# Patient Record
Sex: Male | Born: 1950 | Race: White | Hispanic: No | Marital: Married | Smoking: Current some day smoker
Health system: Southern US, Community
[De-identification: ages and names within clinical notes are randomized; demographics above are authoritative.]

## PROBLEM LIST (undated history)

## (undated) DIAGNOSIS — E119 Type 2 diabetes mellitus without complications: Secondary | ICD-10-CM

## (undated) DIAGNOSIS — Z803 Family history of malignant neoplasm of breast: Secondary | ICD-10-CM

## (undated) DIAGNOSIS — Z800 Family history of malignant neoplasm of digestive organs: Secondary | ICD-10-CM

## (undated) DIAGNOSIS — C4490 Unspecified malignant neoplasm of skin, unspecified: Secondary | ICD-10-CM

## (undated) HISTORY — DX: Family history of malignant neoplasm of digestive organs: Z80.0

## (undated) HISTORY — DX: Family history of malignant neoplasm of breast: Z80.3

## (undated) HISTORY — DX: Unspecified malignant neoplasm of skin, unspecified: C44.90

---

## 2000-04-23 ENCOUNTER — Encounter (INDEPENDENT_AMBULATORY_CARE_PROVIDER_SITE_OTHER): Payer: Self-pay | Admitting: *Deleted

## 2000-04-23 ENCOUNTER — Ambulatory Visit (HOSPITAL_COMMUNITY): Admission: RE | Admit: 2000-04-23 | Discharge: 2000-04-23 | Payer: Self-pay | Admitting: Gastroenterology

## 2001-01-18 ENCOUNTER — Other Ambulatory Visit: Admission: RE | Admit: 2001-01-18 | Discharge: 2001-01-18 | Payer: Self-pay | Admitting: Dermatology

## 2002-03-28 ENCOUNTER — Encounter: Payer: Self-pay | Admitting: Family Medicine

## 2002-03-28 ENCOUNTER — Encounter: Admission: RE | Admit: 2002-03-28 | Discharge: 2002-03-28 | Payer: Self-pay | Admitting: Family Medicine

## 2002-10-25 ENCOUNTER — Encounter: Payer: Self-pay | Admitting: Family Medicine

## 2002-10-25 ENCOUNTER — Encounter: Admission: RE | Admit: 2002-10-25 | Discharge: 2002-10-25 | Payer: Self-pay | Admitting: Family Medicine

## 2005-08-14 ENCOUNTER — Other Ambulatory Visit: Admission: RE | Admit: 2005-08-14 | Discharge: 2005-08-14 | Payer: Self-pay | Admitting: Dermatology

## 2005-12-23 ENCOUNTER — Encounter: Admission: RE | Admit: 2005-12-23 | Discharge: 2005-12-23 | Payer: Self-pay | Admitting: Family Medicine

## 2006-08-17 ENCOUNTER — Encounter: Admission: RE | Admit: 2006-08-17 | Discharge: 2006-08-17 | Payer: Self-pay | Admitting: Family Medicine

## 2010-03-31 ENCOUNTER — Emergency Department (HOSPITAL_COMMUNITY): Admission: EM | Admit: 2010-03-31 | Discharge: 2010-03-31 | Payer: Self-pay | Admitting: Emergency Medicine

## 2010-06-27 ENCOUNTER — Ambulatory Visit (HOSPITAL_COMMUNITY)
Admission: RE | Admit: 2010-06-27 | Discharge: 2010-06-27 | Payer: Self-pay | Source: Home / Self Care | Attending: Family Medicine | Admitting: Family Medicine

## 2013-03-29 ENCOUNTER — Encounter (INDEPENDENT_AMBULATORY_CARE_PROVIDER_SITE_OTHER): Payer: Self-pay | Admitting: *Deleted

## 2013-11-22 ENCOUNTER — Other Ambulatory Visit: Payer: Self-pay | Admitting: Gastroenterology

## 2015-04-12 DIAGNOSIS — J449 Chronic obstructive pulmonary disease, unspecified: Secondary | ICD-10-CM | POA: Diagnosis not present

## 2015-04-12 DIAGNOSIS — Z720 Tobacco use: Secondary | ICD-10-CM | POA: Diagnosis not present

## 2015-04-12 DIAGNOSIS — I129 Hypertensive chronic kidney disease with stage 1 through stage 4 chronic kidney disease, or unspecified chronic kidney disease: Secondary | ICD-10-CM | POA: Diagnosis not present

## 2015-04-12 DIAGNOSIS — E119 Type 2 diabetes mellitus without complications: Secondary | ICD-10-CM | POA: Diagnosis not present

## 2015-04-12 DIAGNOSIS — E782 Mixed hyperlipidemia: Secondary | ICD-10-CM | POA: Diagnosis not present

## 2015-10-15 DIAGNOSIS — E119 Type 2 diabetes mellitus without complications: Secondary | ICD-10-CM | POA: Diagnosis not present

## 2015-10-15 DIAGNOSIS — J449 Chronic obstructive pulmonary disease, unspecified: Secondary | ICD-10-CM | POA: Diagnosis not present

## 2015-10-15 DIAGNOSIS — K5900 Constipation, unspecified: Secondary | ICD-10-CM | POA: Diagnosis not present

## 2015-10-15 DIAGNOSIS — Z0000 Encounter for general adult medical examination without abnormal findings: Secondary | ICD-10-CM | POA: Diagnosis not present

## 2015-10-15 DIAGNOSIS — H6090 Unspecified otitis externa, unspecified ear: Secondary | ICD-10-CM | POA: Diagnosis not present

## 2015-10-15 DIAGNOSIS — M1990 Unspecified osteoarthritis, unspecified site: Secondary | ICD-10-CM | POA: Diagnosis not present

## 2015-10-15 DIAGNOSIS — I129 Hypertensive chronic kidney disease with stage 1 through stage 4 chronic kidney disease, or unspecified chronic kidney disease: Secondary | ICD-10-CM | POA: Diagnosis not present

## 2015-10-15 DIAGNOSIS — Z23 Encounter for immunization: Secondary | ICD-10-CM | POA: Diagnosis not present

## 2015-10-15 DIAGNOSIS — E782 Mixed hyperlipidemia: Secondary | ICD-10-CM | POA: Diagnosis not present

## 2015-10-15 DIAGNOSIS — Z125 Encounter for screening for malignant neoplasm of prostate: Secondary | ICD-10-CM | POA: Diagnosis not present

## 2015-10-15 DIAGNOSIS — Z86010 Personal history of colonic polyps: Secondary | ICD-10-CM | POA: Diagnosis not present

## 2015-10-15 DIAGNOSIS — Z720 Tobacco use: Secondary | ICD-10-CM | POA: Diagnosis not present

## 2015-11-14 DIAGNOSIS — E119 Type 2 diabetes mellitus without complications: Secondary | ICD-10-CM | POA: Diagnosis not present

## 2016-03-13 DIAGNOSIS — N5082 Scrotal pain: Secondary | ICD-10-CM | POA: Diagnosis not present

## 2016-03-13 DIAGNOSIS — L0390 Cellulitis, unspecified: Secondary | ICD-10-CM | POA: Diagnosis not present

## 2016-03-13 DIAGNOSIS — R110 Nausea: Secondary | ICD-10-CM | POA: Diagnosis not present

## 2016-03-13 DIAGNOSIS — N492 Inflammatory disorders of scrotum: Secondary | ICD-10-CM | POA: Diagnosis not present

## 2016-03-13 DIAGNOSIS — R55 Syncope and collapse: Secondary | ICD-10-CM | POA: Diagnosis not present

## 2016-03-14 ENCOUNTER — Inpatient Hospital Stay: Admit: 2016-03-14 | Payer: Medicare Other | Admitting: Urology

## 2016-03-14 ENCOUNTER — Inpatient Hospital Stay (HOSPITAL_COMMUNITY)
Admission: EM | Admit: 2016-03-14 | Discharge: 2016-03-25 | DRG: 853 | Disposition: A | Discharge status: Skilled Nursing Facility | Payer: Medicare Other | Attending: Internal Medicine | Admitting: Internal Medicine

## 2016-03-14 ENCOUNTER — Inpatient Hospital Stay (HOSPITAL_COMMUNITY): Payer: Medicare Other

## 2016-03-14 ENCOUNTER — Encounter (HOSPITAL_COMMUNITY): Payer: Self-pay | Admitting: Emergency Medicine

## 2016-03-14 ENCOUNTER — Encounter (HOSPITAL_COMMUNITY)
Admission: EM | Disposition: A | Discharge status: Skilled Nursing Facility | Payer: Self-pay | Source: Home / Self Care | Attending: Internal Medicine

## 2016-03-14 ENCOUNTER — Inpatient Hospital Stay (HOSPITAL_COMMUNITY): Payer: Medicare Other | Admitting: Anesthesiology

## 2016-03-14 DIAGNOSIS — E1152 Type 2 diabetes mellitus with diabetic peripheral angiopathy with gangrene: Secondary | ICD-10-CM | POA: Diagnosis present

## 2016-03-14 DIAGNOSIS — E872 Acidosis, unspecified: Secondary | ICD-10-CM

## 2016-03-14 DIAGNOSIS — I480 Paroxysmal atrial fibrillation: Secondary | ICD-10-CM | POA: Diagnosis not present

## 2016-03-14 DIAGNOSIS — R6520 Severe sepsis without septic shock: Secondary | ICD-10-CM | POA: Diagnosis not present

## 2016-03-14 DIAGNOSIS — I119 Hypertensive heart disease without heart failure: Secondary | ICD-10-CM | POA: Diagnosis not present

## 2016-03-14 DIAGNOSIS — E871 Hypo-osmolality and hyponatremia: Secondary | ICD-10-CM | POA: Diagnosis present

## 2016-03-14 DIAGNOSIS — N433 Hydrocele, unspecified: Secondary | ICD-10-CM | POA: Diagnosis not present

## 2016-03-14 DIAGNOSIS — Z87891 Personal history of nicotine dependence: Secondary | ICD-10-CM | POA: Diagnosis present

## 2016-03-14 DIAGNOSIS — E139 Other specified diabetes mellitus without complications: Secondary | ICD-10-CM | POA: Diagnosis not present

## 2016-03-14 DIAGNOSIS — Z79899 Other long term (current) drug therapy: Secondary | ICD-10-CM | POA: Diagnosis not present

## 2016-03-14 DIAGNOSIS — J449 Chronic obstructive pulmonary disease, unspecified: Secondary | ICD-10-CM | POA: Diagnosis not present

## 2016-03-14 DIAGNOSIS — M6281 Muscle weakness (generalized): Secondary | ICD-10-CM | POA: Diagnosis not present

## 2016-03-14 DIAGNOSIS — E785 Hyperlipidemia, unspecified: Secondary | ICD-10-CM | POA: Diagnosis not present

## 2016-03-14 DIAGNOSIS — I129 Hypertensive chronic kidney disease with stage 1 through stage 4 chronic kidney disease, or unspecified chronic kidney disease: Secondary | ICD-10-CM | POA: Diagnosis not present

## 2016-03-14 DIAGNOSIS — E119 Type 2 diabetes mellitus without complications: Secondary | ICD-10-CM | POA: Diagnosis not present

## 2016-03-14 DIAGNOSIS — F17200 Nicotine dependence, unspecified, uncomplicated: Secondary | ICD-10-CM | POA: Diagnosis not present

## 2016-03-14 DIAGNOSIS — I96 Gangrene, not elsewhere classified: Secondary | ICD-10-CM | POA: Diagnosis not present

## 2016-03-14 DIAGNOSIS — F17210 Nicotine dependence, cigarettes, uncomplicated: Secondary | ICD-10-CM | POA: Diagnosis not present

## 2016-03-14 DIAGNOSIS — E118 Type 2 diabetes mellitus with unspecified complications: Secondary | ICD-10-CM

## 2016-03-14 DIAGNOSIS — J9601 Acute respiratory failure with hypoxia: Secondary | ICD-10-CM | POA: Diagnosis not present

## 2016-03-14 DIAGNOSIS — I4891 Unspecified atrial fibrillation: Secondary | ICD-10-CM | POA: Diagnosis not present

## 2016-03-14 DIAGNOSIS — R0602 Shortness of breath: Secondary | ICD-10-CM

## 2016-03-14 DIAGNOSIS — L02215 Cutaneous abscess of perineum: Secondary | ICD-10-CM | POA: Diagnosis present

## 2016-03-14 DIAGNOSIS — S31501A Unspecified open wound of unspecified external genital organs, male, initial encounter: Secondary | ICD-10-CM | POA: Diagnosis not present

## 2016-03-14 DIAGNOSIS — I10 Essential (primary) hypertension: Secondary | ICD-10-CM | POA: Diagnosis present

## 2016-03-14 DIAGNOSIS — Z888 Allergy status to other drugs, medicaments and biological substances status: Secondary | ICD-10-CM | POA: Diagnosis not present

## 2016-03-14 DIAGNOSIS — R300 Dysuria: Secondary | ICD-10-CM | POA: Diagnosis not present

## 2016-03-14 DIAGNOSIS — E1165 Type 2 diabetes mellitus with hyperglycemia: Secondary | ICD-10-CM | POA: Diagnosis not present

## 2016-03-14 DIAGNOSIS — D72829 Elevated white blood cell count, unspecified: Secondary | ICD-10-CM | POA: Diagnosis not present

## 2016-03-14 DIAGNOSIS — E860 Dehydration: Secondary | ICD-10-CM | POA: Diagnosis present

## 2016-03-14 DIAGNOSIS — N493 Fournier gangrene: Secondary | ICD-10-CM | POA: Diagnosis present

## 2016-03-14 DIAGNOSIS — N501 Vascular disorders of male genital organs: Secondary | ICD-10-CM | POA: Diagnosis not present

## 2016-03-14 DIAGNOSIS — N432 Other hydrocele: Secondary | ICD-10-CM | POA: Diagnosis present

## 2016-03-14 DIAGNOSIS — Z794 Long term (current) use of insulin: Secondary | ICD-10-CM | POA: Diagnosis not present

## 2016-03-14 DIAGNOSIS — R739 Hyperglycemia, unspecified: Secondary | ICD-10-CM

## 2016-03-14 DIAGNOSIS — N509 Disorder of male genital organs, unspecified: Secondary | ICD-10-CM | POA: Diagnosis not present

## 2016-03-14 DIAGNOSIS — Z885 Allergy status to narcotic agent status: Secondary | ICD-10-CM | POA: Diagnosis not present

## 2016-03-14 DIAGNOSIS — I9789 Other postprocedural complications and disorders of the circulatory system, not elsewhere classified: Secondary | ICD-10-CM | POA: Diagnosis not present

## 2016-03-14 DIAGNOSIS — Z48817 Encounter for surgical aftercare following surgery on the skin and subcutaneous tissue: Secondary | ICD-10-CM | POA: Diagnosis not present

## 2016-03-14 DIAGNOSIS — Z7982 Long term (current) use of aspirin: Secondary | ICD-10-CM | POA: Diagnosis not present

## 2016-03-14 DIAGNOSIS — IMO0002 Reserved for concepts with insufficient information to code with codable children: Secondary | ICD-10-CM | POA: Diagnosis present

## 2016-03-14 DIAGNOSIS — N492 Inflammatory disorders of scrotum: Secondary | ICD-10-CM | POA: Diagnosis not present

## 2016-03-14 DIAGNOSIS — A419 Sepsis, unspecified organism: Secondary | ICD-10-CM | POA: Diagnosis not present

## 2016-03-14 DIAGNOSIS — E0865 Diabetes mellitus due to underlying condition with hyperglycemia: Secondary | ICD-10-CM | POA: Diagnosis not present

## 2016-03-14 DIAGNOSIS — N179 Acute kidney failure, unspecified: Secondary | ICD-10-CM

## 2016-03-14 DIAGNOSIS — D62 Acute posthemorrhagic anemia: Secondary | ICD-10-CM | POA: Diagnosis not present

## 2016-03-14 DIAGNOSIS — K760 Fatty (change of) liver, not elsewhere classified: Secondary | ICD-10-CM | POA: Diagnosis not present

## 2016-03-14 DIAGNOSIS — E089 Diabetes mellitus due to underlying condition without complications: Secondary | ICD-10-CM

## 2016-03-14 DIAGNOSIS — I517 Cardiomegaly: Secondary | ICD-10-CM | POA: Diagnosis not present

## 2016-03-14 DIAGNOSIS — L0889 Other specified local infections of the skin and subcutaneous tissue: Secondary | ICD-10-CM | POA: Diagnosis not present

## 2016-03-14 DIAGNOSIS — R6889 Other general symptoms and signs: Secondary | ICD-10-CM | POA: Diagnosis present

## 2016-03-14 DIAGNOSIS — N289 Disorder of kidney and ureter, unspecified: Secondary | ICD-10-CM

## 2016-03-14 HISTORY — PX: IRRIGATION AND DEBRIDEMENT ABSCESS: SHX5252

## 2016-03-14 HISTORY — PX: INCISION AND DRAINAGE ABSCESS: SHX5864

## 2016-03-14 HISTORY — DX: Type 2 diabetes mellitus without complications: E11.9

## 2016-03-14 LAB — URINE MICROSCOPIC-ADD ON

## 2016-03-14 LAB — URINALYSIS, ROUTINE W REFLEX MICROSCOPIC
Bilirubin Urine: NEGATIVE
GLUCOSE, UA: NEGATIVE mg/dL
KETONES UR: NEGATIVE mg/dL
LEUKOCYTES UA: NEGATIVE
Nitrite: NEGATIVE
PROTEIN: 30 mg/dL — AB
Specific Gravity, Urine: 1.028 (ref 1.005–1.030)
pH: 6 (ref 5.0–8.0)

## 2016-03-14 LAB — CBC WITH DIFFERENTIAL/PLATELET
BASOS ABS: 0 10*3/uL (ref 0.0–0.1)
Basophils Relative: 0 %
EOS ABS: 0 10*3/uL (ref 0.0–0.7)
EOS PCT: 0 %
HCT: 38.1 % — ABNORMAL LOW (ref 39.0–52.0)
Hemoglobin: 13.9 g/dL (ref 13.0–17.0)
LYMPHS ABS: 0.7 10*3/uL (ref 0.7–4.0)
Lymphocytes Relative: 4 %
MCH: 31.4 pg (ref 26.0–34.0)
MCHC: 36.5 g/dL — ABNORMAL HIGH (ref 30.0–36.0)
MCV: 86 fL (ref 78.0–100.0)
MONO ABS: 1.4 10*3/uL — AB (ref 0.1–1.0)
Monocytes Relative: 8 %
NEUTROS PCT: 88 %
Neutro Abs: 14.9 10*3/uL — ABNORMAL HIGH (ref 1.7–7.7)
PLATELETS: 169 10*3/uL (ref 150–400)
RBC: 4.43 MIL/uL (ref 4.22–5.81)
RDW: 13.2 % (ref 11.5–15.5)
WBC Morphology: INCREASED
WBC: 17 10*3/uL — AB (ref 4.0–10.5)

## 2016-03-14 LAB — COMPREHENSIVE METABOLIC PANEL
ALK PHOS: 58 U/L (ref 38–126)
ALT: 16 U/L — AB (ref 17–63)
AST: 20 U/L (ref 15–41)
Albumin: 2.8 g/dL — ABNORMAL LOW (ref 3.5–5.0)
Anion gap: 12 (ref 5–15)
BILIRUBIN TOTAL: 0.9 mg/dL (ref 0.3–1.2)
BUN: 27 mg/dL — AB (ref 6–20)
CALCIUM: 8.5 mg/dL — AB (ref 8.9–10.3)
CO2: 24 mmol/L (ref 22–32)
CREATININE: 1.34 mg/dL — AB (ref 0.61–1.24)
Chloride: 88 mmol/L — ABNORMAL LOW (ref 101–111)
GFR, EST NON AFRICAN AMERICAN: 54 mL/min — AB (ref >60)
Glucose, Bld: 208 mg/dL — ABNORMAL HIGH (ref 65–99)
Potassium: 3.7 mmol/L (ref 3.5–5.1)
Sodium: 124 mmol/L — ABNORMAL LOW (ref 135–145)
TOTAL PROTEIN: 6.6 g/dL (ref 6.5–8.1)

## 2016-03-14 LAB — GLUCOSE, CAPILLARY
Glucose-Capillary: 209 mg/dL — ABNORMAL HIGH (ref 65–99)
Glucose-Capillary: 175 mg/dL — ABNORMAL HIGH (ref 65–99)
Glucose-Capillary: 191 mg/dL — ABNORMAL HIGH (ref 65–99)

## 2016-03-14 LAB — I-STAT CG4 LACTIC ACID, ED: Lactic Acid, Venous: 3.01 mmol/L (ref 0.5–1.9)

## 2016-03-14 LAB — MRSA PCR SCREENING: MRSA BY PCR: NEGATIVE

## 2016-03-14 SURGERY — IRRIGATION AND DEBRIDEMENT ABSCESS
Anesthesia: General | Site: Scrotum

## 2016-03-14 MED ORDER — PIPERACILLIN-TAZOBACTAM 3.375 G IVPB
3.3750 g | Freq: Three times a day (TID) | INTRAVENOUS | Status: DC
  Administered 2016-03-14 – 2016-03-20 (×17): 3.375 g via INTRAVENOUS
  Filled 2016-03-14 (×18): qty 50

## 2016-03-14 MED ORDER — FENTANYL CITRATE (PF) 100 MCG/2ML IJ SOLN
INTRAMUSCULAR | Status: AC
  Filled 2016-03-14: qty 2

## 2016-03-14 MED ORDER — FENOFIBRATE 160 MG PO TABS
160.0000 mg | ORAL_TABLET | Freq: Every day | ORAL | Status: DC
  Administered 2016-03-14 – 2016-03-25 (×10): 160 mg via ORAL
  Filled 2016-03-14 (×10): qty 1

## 2016-03-14 MED ORDER — LIDOCAINE 2% (20 MG/ML) 5 ML SYRINGE
INTRAMUSCULAR | Status: DC | PRN
  Administered 2016-03-14: 50 mg via INTRAVENOUS

## 2016-03-14 MED ORDER — VANCOMYCIN HCL IN DEXTROSE 1-5 GM/200ML-% IV SOLN
1000.0000 mg | Freq: Once | INTRAVENOUS | Status: AC
  Administered 2016-03-14: 1000 mg via INTRAVENOUS
  Filled 2016-03-14: qty 200

## 2016-03-14 MED ORDER — PHENYLEPHRINE HCL 10 MG/ML IJ SOLN
INTRAMUSCULAR | Status: AC
  Filled 2016-03-14: qty 1

## 2016-03-14 MED ORDER — SODIUM CHLORIDE 0.9 % IV BOLUS (SEPSIS)
1000.0000 mL | Freq: Once | INTRAVENOUS | Status: AC
  Administered 2016-03-14: 1000 mL via INTRAVENOUS

## 2016-03-14 MED ORDER — PHENYLEPHRINE 40 MCG/ML (10ML) SYRINGE FOR IV PUSH (FOR BLOOD PRESSURE SUPPORT)
PREFILLED_SYRINGE | INTRAVENOUS | Status: AC
  Filled 2016-03-14: qty 30

## 2016-03-14 MED ORDER — ACETAMINOPHEN 650 MG RE SUPP: 650.0000 mg | Freq: Four times a day (QID) | RECTAL | Status: DC | PRN

## 2016-03-14 MED ORDER — HEPARIN SODIUM (PORCINE) 5000 UNIT/ML IJ SOLN: 5000.0000 [IU] | Freq: Three times a day (TID) | INTRAMUSCULAR | Status: DC

## 2016-03-14 MED ORDER — POTASSIUM CHLORIDE IN NACL 20-0.9 MEQ/L-% IV SOLN
INTRAVENOUS | Status: AC
  Administered 2016-03-14 – 2016-03-15 (×3): via INTRAVENOUS
  Filled 2016-03-14 (×4): qty 1000

## 2016-03-14 MED ORDER — ONDANSETRON HCL 4 MG/2ML IJ SOLN
INTRAMUSCULAR | Status: AC
  Filled 2016-03-14: qty 2

## 2016-03-14 MED ORDER — IOPAMIDOL (ISOVUE-300) INJECTION 61%
30.0000 mL | Freq: Once | INTRAVENOUS | Status: AC | PRN
  Administered 2016-03-14: 30 mL via ORAL

## 2016-03-14 MED ORDER — FENTANYL CITRATE (PF) 100 MCG/2ML IJ SOLN
INTRAMUSCULAR | Status: DC | PRN
  Administered 2016-03-14: 100 ug via INTRAVENOUS

## 2016-03-14 MED ORDER — ACETAMINOPHEN 325 MG PO TABS
650.0000 mg | ORAL_TABLET | Freq: Four times a day (QID) | ORAL | Status: DC | PRN
  Administered 2016-03-14 – 2016-03-19 (×4): 650 mg via ORAL
  Filled 2016-03-14 (×4): qty 2

## 2016-03-14 MED ORDER — SENNOSIDES-DOCUSATE SODIUM 8.6-50 MG PO TABS
1.0000 | ORAL_TABLET | Freq: Every evening | ORAL | Status: DC | PRN
  Administered 2016-03-22: 1 via ORAL
  Filled 2016-03-14: qty 1

## 2016-03-14 MED ORDER — PHENYLEPHRINE HCL 10 MG/ML IJ SOLN
INTRAVENOUS | Status: DC | PRN
  Administered 2016-03-14: 150 ug/min via INTRAVENOUS

## 2016-03-14 MED ORDER — ACETAMINOPHEN 10 MG/ML IV SOLN
INTRAVENOUS | Status: AC
  Filled 2016-03-14: qty 100

## 2016-03-14 MED ORDER — PROPOFOL 10 MG/ML IV BOLUS
INTRAVENOUS | Status: AC
Start: 2016-03-14 — End: 2016-03-14
  Filled 2016-03-14: qty 20

## 2016-03-14 MED ORDER — COLESEVELAM HCL 625 MG PO TABS
1250.0000 mg | ORAL_TABLET | Freq: Two times a day (BID) | ORAL | Status: DC
  Administered 2016-03-14 – 2016-03-25 (×19): 1250 mg via ORAL
  Filled 2016-03-14 (×25): qty 2

## 2016-03-14 MED ORDER — PROPOFOL 10 MG/ML IV BOLUS
INTRAVENOUS | Status: AC
  Filled 2016-03-14: qty 20

## 2016-03-14 MED ORDER — ONDANSETRON HCL 4 MG/2ML IJ SOLN: 4.0000 mg | Freq: Four times a day (QID) | INTRAMUSCULAR | Status: DC | PRN

## 2016-03-14 MED ORDER — INSULIN GLARGINE 100 UNIT/ML ~~LOC~~ SOLN
8.0000 [IU] | Freq: Every day | SUBCUTANEOUS | Status: DC
  Administered 2016-03-15 – 2016-03-24 (×9): 8 [IU] via SUBCUTANEOUS
  Filled 2016-03-14 (×10): qty 0.08

## 2016-03-14 MED ORDER — ONDANSETRON HCL 4 MG/2ML IJ SOLN: 4.0000 mg | Freq: Once | INTRAMUSCULAR | Status: DC | PRN

## 2016-03-14 MED ORDER — PROPOFOL 10 MG/ML IV BOLUS
INTRAVENOUS | Status: DC | PRN
  Administered 2016-03-14: 50 mg via INTRAVENOUS
  Administered 2016-03-14: 160 mg via INTRAVENOUS

## 2016-03-14 MED ORDER — MIDAZOLAM HCL 5 MG/5ML IJ SOLN
INTRAMUSCULAR | Status: DC | PRN
  Administered 2016-03-14: 2 mg via INTRAVENOUS

## 2016-03-14 MED ORDER — PHENYLEPHRINE 40 MCG/ML (10ML) SYRINGE FOR IV PUSH (FOR BLOOD PRESSURE SUPPORT)
PREFILLED_SYRINGE | INTRAVENOUS | Status: DC | PRN
  Administered 2016-03-14 (×2): 120 ug via INTRAVENOUS
  Administered 2016-03-14: 80 ug via INTRAVENOUS
  Administered 2016-03-14 (×3): 120 ug via INTRAVENOUS
  Administered 2016-03-14: 200 ug via INTRAVENOUS
  Administered 2016-03-14 (×2): 120 ug via INTRAVENOUS

## 2016-03-14 MED ORDER — SODIUM CHLORIDE 0.9 % IV SOLN
INTRAVENOUS | Status: DC
  Administered 2016-03-14 (×2): via INTRAVENOUS

## 2016-03-14 MED ORDER — MEPERIDINE HCL 50 MG/ML IJ SOLN: 6.2500 mg | INTRAMUSCULAR | Status: DC | PRN

## 2016-03-14 MED ORDER — MIDAZOLAM HCL 2 MG/2ML IJ SOLN
INTRAMUSCULAR | Status: AC
  Filled 2016-03-14: qty 2

## 2016-03-14 MED ORDER — SODIUM CHLORIDE 0.9 % IR SOLN
Status: DC | PRN
  Administered 2016-03-14: 500 mL

## 2016-03-14 MED ORDER — LIDOCAINE 2% (20 MG/ML) 5 ML SYRINGE
INTRAMUSCULAR | Status: AC
Start: 2016-03-14 — End: 2016-03-14
  Filled 2016-03-14: qty 5

## 2016-03-14 MED ORDER — OXYCODONE HCL 5 MG/5ML PO SOLN: 5.0000 mg | Freq: Once | ORAL | Status: DC | PRN

## 2016-03-14 MED ORDER — INSULIN ASPART 100 UNIT/ML ~~LOC~~ SOLN: 3.0000 [IU] | Freq: Three times a day (TID) | SUBCUTANEOUS | Status: DC

## 2016-03-14 MED ORDER — STERILE WATER FOR IRRIGATION IR SOLN
Status: DC | PRN
  Administered 2016-03-14: 1000 mL

## 2016-03-14 MED ORDER — PIPERACILLIN-TAZOBACTAM 3.375 G IVPB 30 MIN
3.3750 g | Freq: Once | INTRAVENOUS | Status: AC
  Administered 2016-03-14: 3.375 g via INTRAVENOUS
  Filled 2016-03-14: qty 50

## 2016-03-14 MED ORDER — OXYCODONE HCL 5 MG PO TABS: 5.0000 mg | ORAL_TABLET | Freq: Once | ORAL | Status: DC | PRN

## 2016-03-14 MED ORDER — IPRATROPIUM-ALBUTEROL 0.5-2.5 (3) MG/3ML IN SOLN
3.0000 mL | Freq: Once | RESPIRATORY_TRACT | Status: AC
  Administered 2016-03-14: 3 mL via RESPIRATORY_TRACT
  Filled 2016-03-14: qty 3

## 2016-03-14 MED ORDER — ONDANSETRON HCL 4 MG PO TABS: 4.0000 mg | ORAL_TABLET | Freq: Four times a day (QID) | ORAL | Status: DC | PRN

## 2016-03-14 MED ORDER — ASPIRIN EC 325 MG PO TBEC
325.0000 mg | DELAYED_RELEASE_TABLET | Freq: Every evening | ORAL | Status: DC
  Administered 2016-03-14 – 2016-03-24 (×11): 325 mg via ORAL
  Filled 2016-03-14 (×11): qty 1

## 2016-03-14 MED ORDER — VANCOMYCIN HCL IN DEXTROSE 750-5 MG/150ML-% IV SOLN
750.0000 mg | Freq: Two times a day (BID) | INTRAVENOUS | Status: DC
  Administered 2016-03-14 – 2016-03-16 (×4): 750 mg via INTRAVENOUS
  Filled 2016-03-14 (×4): qty 150

## 2016-03-14 MED ORDER — ACETAMINOPHEN 10 MG/ML IV SOLN
1000.0000 mg | Freq: Once | INTRAVENOUS | Status: AC
  Administered 2016-03-14: 1000 mg via INTRAVENOUS

## 2016-03-14 MED ORDER — ZOLPIDEM TARTRATE 5 MG PO TABS
5.0000 mg | ORAL_TABLET | Freq: Every evening | ORAL | Status: DC | PRN
  Administered 2016-03-14: 5 mg via ORAL
  Filled 2016-03-14: qty 1

## 2016-03-14 MED ORDER — IOPAMIDOL (ISOVUE-300) INJECTION 61%
100.0000 mL | Freq: Once | INTRAVENOUS | Status: AC | PRN
  Administered 2016-03-14: 100 mL via INTRAVENOUS

## 2016-03-14 MED ORDER — INSULIN ASPART 100 UNIT/ML ~~LOC~~ SOLN
0.0000 [IU] | Freq: Three times a day (TID) | SUBCUTANEOUS | Status: DC
  Administered 2016-03-14 – 2016-03-15 (×3): 2 [IU] via SUBCUTANEOUS
  Administered 2016-03-15: 3 [IU] via SUBCUTANEOUS
  Administered 2016-03-16 (×3): 1 [IU] via SUBCUTANEOUS
  Administered 2016-03-17: 2 [IU] via SUBCUTANEOUS
  Administered 2016-03-17: 1 [IU] via SUBCUTANEOUS
  Administered 2016-03-17: 3 [IU] via SUBCUTANEOUS
  Administered 2016-03-18 – 2016-03-19 (×5): 1 [IU] via SUBCUTANEOUS
  Administered 2016-03-20: 3 [IU] via SUBCUTANEOUS
  Administered 2016-03-20 – 2016-03-22 (×5): 1 [IU] via SUBCUTANEOUS
  Administered 2016-03-22: 2 [IU] via SUBCUTANEOUS
  Administered 2016-03-22: 3 [IU] via SUBCUTANEOUS
  Administered 2016-03-23: 2 [IU] via SUBCUTANEOUS
  Administered 2016-03-23: 1 [IU] via SUBCUTANEOUS
  Administered 2016-03-24: 2 [IU] via SUBCUTANEOUS
  Administered 2016-03-24 – 2016-03-25 (×2): 1 [IU] via SUBCUTANEOUS

## 2016-03-14 MED ORDER — HYDROMORPHONE HCL 1 MG/ML IJ SOLN: 0.2500 mg | INTRAMUSCULAR | Status: DC | PRN

## 2016-03-14 MED ORDER — IPRATROPIUM-ALBUTEROL 0.5-2.5 (3) MG/3ML IN SOLN
3.0000 mL | Freq: Three times a day (TID) | RESPIRATORY_TRACT | Status: DC
  Administered 2016-03-15: 3 mL via RESPIRATORY_TRACT
  Filled 2016-03-14: qty 3

## 2016-03-14 MED ORDER — SUCCINYLCHOLINE CHLORIDE 20 MG/ML IJ SOLN
INTRAMUSCULAR | Status: DC | PRN
  Administered 2016-03-14: 80 mg via INTRAVENOUS

## 2016-03-14 MED ORDER — HYDROMORPHONE HCL 1 MG/ML IJ SOLN
1.0000 mg | Freq: Once | INTRAMUSCULAR | Status: AC
  Administered 2016-03-14: 1 mg via INTRAVENOUS
  Filled 2016-03-14: qty 1

## 2016-03-14 MED ORDER — INSULIN ASPART 100 UNIT/ML ~~LOC~~ SOLN
3.0000 [IU] | Freq: Three times a day (TID) | SUBCUTANEOUS | Status: DC
  Administered 2016-03-14 – 2016-03-25 (×21): 3 [IU] via SUBCUTANEOUS

## 2016-03-14 MED ORDER — SODIUM CHLORIDE 0.9 % IR SOLN
Status: AC
  Filled 2016-03-14: qty 500000

## 2016-03-14 MED ORDER — ROSUVASTATIN CALCIUM 10 MG PO TABS
5.0000 mg | ORAL_TABLET | Freq: Every evening | ORAL | Status: DC
  Administered 2016-03-14 – 2016-03-24 (×11): 5 mg via ORAL
  Filled 2016-03-14 (×12): qty 1

## 2016-03-14 MED ORDER — CLINDAMYCIN PHOSPHATE 900 MG/50ML IV SOLN
900.0000 mg | Freq: Once | INTRAVENOUS | Status: AC
  Administered 2016-03-14: 900 mg via INTRAVENOUS
  Filled 2016-03-14: qty 50

## 2016-03-14 MED ORDER — SODIUM CHLORIDE 0.9 % IR SOLN
Status: DC | PRN
  Administered 2016-03-14: 6000 mL

## 2016-03-14 MED ORDER — LACTATED RINGERS IV SOLN: INTRAVENOUS | Status: DC | PRN

## 2016-03-14 MED ORDER — IPRATROPIUM-ALBUTEROL 0.5-2.5 (3) MG/3ML IN SOLN
3.0000 mL | Freq: Four times a day (QID) | RESPIRATORY_TRACT | Status: DC
  Administered 2016-03-14: 3 mL via RESPIRATORY_TRACT
  Filled 2016-03-14: qty 3

## 2016-03-14 SURGICAL SUPPLY — 50 items
APL SKNCLS STERI-STRIP NONHPOA (GAUZE/BANDAGES/DRESSINGS)
BENZOIN TINCTURE PRP APPL 2/3 (GAUZE/BANDAGES/DRESSINGS) IMPLANT
BLADE HEX COATED 2.75 (ELECTRODE) ×8 IMPLANT
BLADE SURG 15 STRL LF DISP TIS (BLADE) ×4 IMPLANT
BLADE SURG 15 STRL SS (BLADE) ×8
BLADE SURG SZ10 CARB STEEL (BLADE) ×8 IMPLANT
BNDG GAUZE ELAST 4 BULKY (GAUZE/BANDAGES/DRESSINGS) ×4 IMPLANT
BRIEF STRETCH FOR OB PAD LRG (UNDERPADS AND DIAPERS) ×2 IMPLANT
CATH FOLEY 2WAY SLVR  5CC 18FR (CATHETERS) ×2
CATH FOLEY 2WAY SLVR 5CC 18FR (CATHETERS) IMPLANT
CLOSURE WOUND 1/2 X4 (GAUZE/BANDAGES/DRESSINGS)
DRAPE LAPAROTOMY T 102X78X121 (DRAPES) ×2 IMPLANT
DRAPE POUCH INSTRU U-SHP 10X18 (DRAPES) ×4 IMPLANT
DRSG KUZMA FLUFF (GAUZE/BANDAGES/DRESSINGS) ×2 IMPLANT
ELECT PENCIL ROCKER SW 15FT (MISCELLANEOUS) ×8 IMPLANT
ELECT REM PT RETURN 9FT ADLT (ELECTROSURGICAL) ×8
ELECTRODE REM PT RTRN 9FT ADLT (ELECTROSURGICAL) ×4 IMPLANT
EVACUATOR SILICONE 100CC (DRAIN) IMPLANT
GAUZE SPONGE 4X4 12PLY STRL (GAUZE/BANDAGES/DRESSINGS) ×8 IMPLANT
GAUZE SPONGE 4X4 16PLY XRAY LF (GAUZE/BANDAGES/DRESSINGS) ×6 IMPLANT
GLOVE BIO SURGEON STRL SZ7.5 (GLOVE) ×2 IMPLANT
GLOVE BIOGEL PI IND STRL 7.0 (GLOVE) ×2 IMPLANT
GLOVE BIOGEL PI IND STRL 7.5 (GLOVE) IMPLANT
GLOVE BIOGEL PI IND STRL 8 (GLOVE) IMPLANT
GLOVE BIOGEL PI INDICATOR 7.0 (GLOVE) ×4
GLOVE BIOGEL PI INDICATOR 7.5 (GLOVE) ×2
GLOVE BIOGEL PI INDICATOR 8 (GLOVE) ×2
GLOVE EUDERMIC 7 POWDERFREE (GLOVE) ×8 IMPLANT
GLOVE SURG ORTHO 8.0 STRL STRW (GLOVE) ×2 IMPLANT
GOWN STRL REUS W/TWL LRG LVL3 (GOWN DISPOSABLE) ×4 IMPLANT
GOWN STRL REUS W/TWL XL LVL3 (GOWN DISPOSABLE) ×10 IMPLANT
HANDPIECE INTERPULSE COAX TIP (DISPOSABLE) ×4
KIT BASIN OR (CUSTOM PROCEDURE TRAY) ×6 IMPLANT
NDL HYPO 25X1 1.5 SAFETY (NEEDLE) ×4 IMPLANT
NEEDLE HYPO 25X1 1.5 SAFETY (NEEDLE) ×8 IMPLANT
PACK BASIC VI WITH GOWN DISP (CUSTOM PROCEDURE TRAY) ×6 IMPLANT
PAD ABD 8X10 STRL (GAUZE/BANDAGES/DRESSINGS) ×6 IMPLANT
SET HNDPC FAN SPRY TIP SCT (DISPOSABLE) IMPLANT
SHEET LAVH (DRAPES) ×2 IMPLANT
SOL PREP POV-IOD 4OZ 10% (MISCELLANEOUS) ×4 IMPLANT
SPONGE LAP 18X18 X RAY DECT (DISPOSABLE) ×4 IMPLANT
SPONGE LAP 4X18 X RAY DECT (DISPOSABLE) ×8 IMPLANT
STAPLER VISISTAT 35W (STAPLE) IMPLANT
STRIP CLOSURE SKIN 1/2X4 (GAUZE/BANDAGES/DRESSINGS) IMPLANT
SUT MNCRL AB 4-0 PS2 18 (SUTURE) IMPLANT
SUT VIC AB 3-0 SH 18 (SUTURE) IMPLANT
SYR CONTROL 10ML LL (SYRINGE) ×6 IMPLANT
TOWEL OR 17X26 10 PK STRL BLUE (TOWEL DISPOSABLE) ×4 IMPLANT
TUBE CONNECTING VINYL 14FR 30C (MISCELLANEOUS) ×2 IMPLANT
YANKAUER SUCT BULB TIP 10FT TU (MISCELLANEOUS) ×2 IMPLANT

## 2016-03-14 NOTE — ED Notes (Signed)
Lactic 3.01 MD &RN made aware

## 2016-03-14 NOTE — ED Notes (Signed)
Unable to collect lab patient is not in the room

## 2016-03-14 NOTE — H&P (Addendum)
History and Physical  Jonathan Tucker [id number] DOB: January 21, 1951 DOA: 03/14/2016  Referring physician: Leonette Monarch PCP: Glo Herring., MD  Outpatient Specialists: [place] - urology  Chief Complaint:: groin swelling  HPI: Jonathan Tucker is a 65 y.o. male with history of type 2 diabetes mellitus, non-insulin requiring was sent to ED by his urologist earlier today with concerns for sepsis and a perineal infection concerning for fournier's gangrene.  He developed symptoms 5 days ago and they have progressively worsened.  He was having rigors at home followed by body sweats and fever.  He is having increasing pain and discomfort in groin.  He was seen in the office of his urologist today and sent to ED for admission.   He is going to the OR later today.    In ED patient was noted to have an elevated lactic acid, leukocytosis and tachycardia.  He was started on sepsis bundle and medical admission was requested.    Review of Systems: All systems reviewed and apart from history of presenting illness, are negative.  Past Medical History:  Diagnosis Date  . Diabetes mellitus without complication ([place])    History reviewed. No pertinent surgical history. Social History:  reports that he has been smoking.  He has never used smokeless tobacco. He reports that he does not drink alcohol. His drug history is not on file.   Allergies  Allergen Reactions  . Codeine     Does not tolerate, tolerates hydrocodone   . Flexeril [Cyclobenzaprine]     Burning/itching    History reviewed. No pertinent family history.   Prior to Admission medications   Medication Sig Start Date End Date Taking? Authorizing Provider  aspirin EC 325 MG tablet Take 325 mg by mouth every evening.   Yes Historical Provider, MD  calcium carbonate (TUMS - DOSED IN MG ELEMENTAL CALCIUM) 500 MG chewable tablet Chew 1 tablet by mouth daily as needed for indigestion or heartburn.   Yes Historical Provider, MD  colesevelam  (WELCHOL) 625 MG tablet Take 1,250 mg by mouth 2 (two) times daily with a meal.   Yes Historical Provider, MD  fenofibrate (TRICOR) 145 MG tablet Take 145 mg by mouth every evening.   Yes Historical Provider, MD  hydrochlorothiazide (HYDRODIURIL) 25 MG tablet Take 25 mg by mouth daily.   Yes Historical Provider, MD  naproxen sodium (ANAPROX) 220 MG tablet Take 440 mg by mouth daily as needed (pain).   Yes Historical Provider, MD  ramipril (ALTACE) 10 MG capsule Take 10 mg by mouth every evening.   Yes Historical Provider, MD  rosuvastatin (CRESTOR) 5 MG tablet Take 5 mg by mouth every evening.   Yes Historical Provider, MD   Physical Exam: Vitals:   03/14/16 1055 03/14/16 1100 03/14/16 1126 03/14/16 1156  BP: 124/81 126/82 126/82 126/82  Pulse: 116 115 (!) 57 60  Resp: (!) 30 20 15 15   Temp:      TempSrc:      SpO2: 97% 96% 93% 98%  Weight:       Constitutional: He is oriented to person, place, and time. He appears well-developed and well-nourished. No distress.  HENT: Head: Normocephalic and atraumatic.  Eyes: Conjunctivae are normal. Pupils are equal, round, and reactive to light. No scleral icterus.  Neck: Normal range of motion. Neck supple. No tracheal deviation present. No thyromegaly present.  Cardiovascular: Regular rhythm and normal heart sounds.  No murmur heard. Respiratory: Effort normal. No respiratory distress. He has diffuse bilateral wheezes (  expiratory). He has no rales.  GI: Soft. Bowel sounds are normal. He exhibits no distension and no mass. There is no tenderness. There is no rebound and no guarding.  Genitourinary: Penis normal.  Genitourinary Comments: Enlarged, indurated and tender scrotum; induration and erythema extending onto perineum and along medial right buttock; no drainage   Musculoskeletal: Normal range of motion. He exhibits no edema or deformity.  Neurological: He is alert and oriented to person, place, and time.  Skin: Skin is warm and dry. He is not  diaphoretic.  Psychiatric: He has a normal mood and affect. His behavior is normal.   Labs on Admission:  Basic Metabolic Panel:  Recent Labs Lab 03/14/16 1034  NA 124*  K 3.7  CL 88*  CO2 24  GLUCOSE 208*  BUN 27*  CREATININE 1.34*  CALCIUM 8.5*   Liver Function Tests:  Recent Labs Lab 03/14/16 1034  AST 20  ALT 16*  ALKPHOS 58  BILITOT 0.9  PROT 6.6  ALBUMIN 2.8*   No results for input(s): LIPASE, AMYLASE in the last 168 hours. No results for input(s): AMMONIA in the last 168 hours. CBC:  Recent Labs Lab 03/14/16 1034  WBC 17.0*  NEUTROABS 14.9*  HGB 13.9  HCT 38.1*  MCV 86.0  PLT 169   Cardiac Enzymes: No results for input(s): CKTOTAL, CKMB, CKMBINDEX, TROPONINI in the last 168 hours.  BNP (last 3 results) No results for input(s): PROBNP in the last 8760 hours. CBG: No results for input(s): GLUCAP in the last 168 hours.  Radiological Exams on Admission: No results found.  EKG: Independently reviewed.   Assessment/Plan Principal Problem:   Sepsis ([place]) Active Problems:   Uncontrolled diabetes mellitus ([place])   Fournier's gangrene in male   Hyponatremia   Leukocytosis   Acute renal failure (ARF) ([facility])   Current every day smoker   Severe sepsis ([place])  1. Severe sepsis secondary to peritoneal infection, concerning for fournier's gangrene - Admit to SDU, continue sepsis bundle, trend lactate, continue IV antibiotics, Pt to be taken to OR later today by urology.  2. Hyponatremia secondary to dehydration - should correct with IVF hydration.  3. COPD-duonebs ordered. Pt reports that this has been stable and only uses albuterol rescue inhalers as needed.  4. Acute Renal Failure - secondary to prerenal azotemia, repeat labs in AM. Avoiding nephrotoxic agents.  Renally dose antibiotics.  5. Current everyday smoker - cessation encouraged, nebs ordered as needed for wheezing.  6. Type 2 diabetes mellitus, non insulin requiring - managing with frequent  accu-cheks, supplemental sliding scale coverage, low dose basal insulin, low dose prandial coverage when eating (hold when NPO).  Further titrate as needed.  Checking A1c.    DVT Prophylaxis: heparin Code Status: Full  Family Communication: bedside  Disposition Plan: TBD   Critical Care Time spent: 65 mins  Irwin Brakeman, MD [facility] Pager [phone]  If 7PM-7AM, please contact night-coverage [email or web address] Password [facility] 03/14/2016, 12:07 PM

## 2016-03-14 NOTE — Anesthesia Preprocedure Evaluation (Signed)
Anesthesia Evaluation  Patient identified by MRN, date of birth, ID band Patient awake    Reviewed: Allergy & Precautions, NPO status , Patient's Chart, lab work & pertinent test results  Airway Mallampati: I  TM Distance: >3 FB Neck ROM: Full    Dental  (+) Poor Dentition, Dental Advisory Given, Teeth Intact   Pulmonary COPD, Current Smoker,    breath sounds clear to auscultation       Cardiovascular  Rhythm:Regular Rate:Normal     Neuro/Psych    GI/Hepatic   Endo/Other  diabetes, Poorly Controlled, Type 2  Renal/GU      Musculoskeletal   Abdominal   Peds  Hematology   Anesthesia Other Findings ?Sepsis based on Lactic acid elevation  Reproductive/Obstetrics                             Anesthesia Physical Anesthesia Plan  ASA: III  Anesthesia Plan: General   Post-op Pain Management:    Induction: Intravenous  Airway Management Planned: Oral ETT  Additional Equipment:   Intra-op Plan:   Post-operative Plan: Extubation in OR  Informed Consent: I have reviewed the patients History and Physical, chart, labs and discussed the procedure including the risks, benefits and alternatives for the proposed anesthesia with the patient or authorized representative who has indicated his/her understanding and acceptance.   Dental advisory given  Plan Discussed with: CRNA, Anesthesiologist and Surgeon  Anesthesia Plan Comments:         Anesthesia Quick Evaluation

## 2016-03-14 NOTE — Anesthesia Procedure Notes (Signed)
Procedure Name: Intubation Performed by: Ola Raap J Pre-anesthesia Checklist: Patient identified, Emergency Drugs available, Suction available, Patient being monitored and Timeout performed Patient Re-evaluated:Patient Re-evaluated prior to inductionOxygen Delivery Method: Circle system utilized Preoxygenation: Pre-oxygenation with 100% oxygen Intubation Type: IV induction Ventilation: Mask ventilation without difficulty Laryngoscope Size: Mac and 4 Grade View: Grade I Tube type: Oral Tube size: 7.5 mm Number of attempts: 1 Airway Equipment and Method: Stylet Placement Confirmation: ETT inserted through vocal cords under direct vision,  positive ETCO2,  CO2 detector and breath sounds checked- equal and bilateral Secured at: 23 cm Tube secured with: Tape Dental Injury: Teeth and Oropharynx as per pre-operative assessment        

## 2016-03-14 NOTE — Transfer of Care (Signed)
Immediate Anesthesia Transfer of Care Note  Patient: Jonathan Tucker  Procedure(s) Performed: Procedure(s): IRRIGATION AND DEBRIDEMENT OF PERINEAL AND SCROTAL ABSCESS, AND CYSTOSCOPY (N/A)  Patient Location: PACU  Anesthesia Type:General  Level of Consciousness: awake  Airway & Oxygen Therapy: Patient Spontanous Breathing and Patient connected to face mask oxygen  Post-op Assessment: Report given to RN and Post -op Vital signs reviewed and stable  Post vital signs: Reviewed and stable  Last Vitals:  Vitals:   03/14/16 1252 03/14/16 1253  BP: 109/68 109/68  Pulse: 110 109  Resp: 23 (!) 30  Temp:      Last Pain:  Vitals:   03/14/16 1135  TempSrc:   PainSc: 4          Complications: No apparent anesthesia complications

## 2016-03-14 NOTE — ED Notes (Signed)
Pt attempting to use urinal.

## 2016-03-14 NOTE — ED Notes (Signed)
Gave pt urinal and explained need for urine sample

## 2016-03-14 NOTE — ED Notes (Signed)
Start IV abx per MD

## 2016-03-14 NOTE — Brief Op Note (Signed)
03/14/2016  3:48 PM  PATIENT:  Jonathan Tucker  65 y.o. male  PRE-OPERATIVE DIAGNOSIS:  Fournier's gangrene  POST-OPERATIVE DIAGNOSIS:  same  PROCEDURE:  Procedure(s): IRRIGATION AND DEBRIDEMENT OF PERINEAL AND SCROTAL ABSCESS, AND CYSTOSCOPY (N/A)  SURGEON:  Surgeon(s) and Role:    * Bjorn Loser, MD - Primary    * Armandina Gemma, MD - Assisting  ASSISTANTS: Dr. Sharmaine Base, MD (resident)   ANESTHESIA:   general  EBL:  Total I/O In: [id number] [I.V.:1000; IV Piggyback:2100] Out: 100 [Blood:100]  BLOOD ADMINISTERED:none  DRAINS: none   LOCAL MEDICATIONS USED:  NONE  SPECIMEN:  Source of Specimen:  aerobe and anaerobic cultures, debrided tissue  DISPOSITION OF SPECIMEN:  PATHOLOGY  COUNTS:  YES  TOURNIQUET:  * No tourniquets in log *  DICTATION: .Other Dictation: Dictation Number [id number]  PLAN OF CARE: Admit to inpatient   PATIENT DISPOSITION:  ICU - extubated and stable.   Delay start of Pharmacological VTE agent (>24hrs) due to surgical blood loss or risk of bleeding: yes  [name], MD, [name] Office: [phone]

## 2016-03-14 NOTE — ED Notes (Signed)
2 IV attempts; right AC and right hand.

## 2016-03-14 NOTE — Op Note (Signed)
Preoperative diagnosis: Fournier's gangrene with scrotal and perineal and perirectal abscess Postoperative diagnosis: Fournier's gangrene with scrotal and perineal and perirectal abscess Surgery: Incision and drainage and debridement of Fournier's gangrene and scrotal abscess and cystoscopy Surgeon: Dr. Nicki Reaper Shayleen Eppinger Assistant Dr. Chuck Hint  The patient has the above diagnoses and consented the above procedure. It was a team approach. Septic protocol had been started. Patient was prepped and draped in usual fashion. IV antibiotics have been started. Extra care was taken with leg positioning. He had very impressive scrotal edema and almost a doughy scrotum. There was a lot of induration in the perineum especially to the right of the rectum. The CT scan had been extensively reviewed by the entire team preoperatively  I made a long midline incision bifurcating the scrotum for two thirds of its length and the perineum. It was bifurcated to the right and left perineum by Dr. Harlow Asa. The incision was carried down with blunt and sharp dissection and cutting current. The patient was bivalved to expose the bulbospongiosus muscle overlying the healthy bulbar urethra.  Prior to this dictation cystoscoped with a 24 [place] scope. Penile bulbar membranous urethra were normal. Prostatic urethra demonstrated an enlarged prostate. Bladder was clear with no cystitis. 30 French catheter was easily inserted  Extensive debridement was performed and most of the necrotic tissue was in the perineum and perirectal area and this will be described to the Gen. surgery team. The scrotal area was primarily edematous tissue and the overlying skin looked good. I did make some subcutaneous tissue planes. Radiographically and clinically had a large right hydrocele. It was not opened. The left testicle tunica was not opened either. He did have moderate penile edema. There is no cellulitis and he did not need an incision in this area.  The suprapubic area was clear.  As noted general surgery did most of the dissection and removal of dead tissue. Approxi-3 L with a pulse VAC was utilized. The tissue actually looked quite healthy at the end of the case with a little bit of deep later necrotic tissue in the deep perirectal area on the right side left by general surgery. Multiple packs were utilized. Mesh pants and AVD pads were utilized.  The plan is to take the patient back to the operating room on Sunday for further evaluation and/or debridement

## 2016-03-14 NOTE — Progress Notes (Signed)
Pharmacy Antibiotic Follow-up Note  Jonathan Tucker is a 65 y.o. year-old male admitted on 03/14/2016.  The patient is currently on day 1 of Vancomycin & Zosyn for perineal infection, r/o sepsis, r/o Fournier's gangrene.  Assessment/Plan: Vancomycin 1gm x1 in ED, then 750mg  IV every 12 hours.  Goal trough 15-20 mcg/mL. Zosyn 3.375g IV q8h (4 hour infusion).  Temp (24hrs), Avg:98.2 F (36.8 C), Min:98.2 F (36.8 C), Max:98.2 F (36.8 C)  No results for input(s): WBC in the last 168 hours.  Invalid input(s):  CREATININE No results for input(s): CREATININE in the last 168 hours. CrCl cannot be calculated (Unknown ideal weight.).    Allergies  Allergen Reactions  . Codeine     Does not tolerate, tolerates hydrocodone   . Flexeril [Cyclobenzaprine]     Burning/itching   Antimicrobials this admission:  9/29 Clinda once 9/29 Zosyn >>  9/29 Vanc >>  Dose adjustments this admission:   Microbiology results: 9/29 BCx: sent 9/29 UCx: sent   Thank you for allowing pharmacy to be a part of this patient's care.  Minda Ditto PharmD 03/14/2016 10:33 AM

## 2016-03-14 NOTE — ED Notes (Signed)
Pt transported to CT ?

## 2016-03-14 NOTE — Anesthesia Postprocedure Evaluation (Signed)
Anesthesia Post Note  Patient: Jonathan Tucker  Procedure(s) Performed: Procedure(s) (LRB): IRRIGATION AND DEBRIDEMENT OF PERINEAL AND SCROTAL ABSCESS, AND CYSTOSCOPY (N/A)  Patient location during evaluation: PACU Anesthesia Type: General Level of consciousness: awake and alert Pain management: pain level controlled Vital Signs Assessment: post-procedure vital signs reviewed and stable Respiratory status: spontaneous breathing, nonlabored ventilation, respiratory function stable and patient connected to nasal cannula oxygen Cardiovascular status: blood pressure returned to baseline and stable Postop Assessment: no signs of nausea or vomiting Anesthetic complications: no    Last Vitals:  Vitals:   03/14/16 1630 03/14/16 1637  BP: 106/63 116/63  Pulse: 99 96  Resp: 14 17  Temp:      Last Pain:  Vitals:   03/14/16 1135  TempSrc:   PainSc: 4                  Griff Badley A

## 2016-03-14 NOTE — ED Notes (Signed)
Pt transported to OR

## 2016-03-14 NOTE — ED Provider Notes (Signed)
[place] DEPT Provider Note   CSN: [id number] Arrival date & time: 03/14/16  [id number]     History   Chief Complaint Chief Complaint  Patient presents with  . Groin Swelling    HPI RONEY DURAL is a 65 y.o. male.  The history is provided by the patient.  Patient presented from urology office sent for concern for Fournier's disease. Patient has been having a scrotal pain since Sunday with associated intermittent fevers and chills. Pain is constantly worsening since the patient also noted swelling and redness in the perineal region. Patient was seen by primary care doctor who referred the patient to urology. Patient denies any nausea, vomiting, abdominal pain, diarrhea.  Past Medical History:  Diagnosis Date  . Diabetes mellitus without complication [facility])     Patient Active Problem List   Diagnosis Date Noted  . Uncontrolled diabetes mellitus ([place]) 03/14/2016  . Sepsis ([place]) 03/14/2016  . Fournier's gangrene in male 03/14/2016  . Hyponatremia 03/14/2016  . Leukocytosis 03/14/2016  . Acute renal failure (ARF) ([place]) 03/14/2016  . Current every day smoker 03/14/2016  . Severe sepsis ([place]) 03/14/2016  . Rigors 03/14/2016  . COPD (chronic obstructive pulmonary disease) ([place]) 03/14/2016    History reviewed. No pertinent surgical history.     Home Medications    Prior to Admission medications   Medication Sig Start Date End Date Taking? Authorizing Provider  aspirin EC 325 MG tablet Take 325 mg by mouth every evening.   Yes Historical Provider, MD  calcium carbonate (TUMS - DOSED IN MG ELEMENTAL CALCIUM) 500 MG chewable tablet Chew 1 tablet by mouth daily as needed for indigestion or heartburn.   Yes Historical Provider, MD  colesevelam (WELCHOL) 625 MG tablet Take 1,250 mg by mouth 2 (two) times daily with a meal.   Yes Historical Provider, MD  fenofibrate (TRICOR) 145 MG tablet Take 145 mg by mouth every evening.   Yes Historical Provider, MD  hydrochlorothiazide  (HYDRODIURIL) 25 MG tablet Take 25 mg by mouth daily.   Yes Historical Provider, MD  naproxen sodium (ANAPROX) 220 MG tablet Take 440 mg by mouth daily as needed (pain).   Yes Historical Provider, MD  ramipril (ALTACE) 10 MG capsule Take 10 mg by mouth every evening.   Yes Historical Provider, MD  rosuvastatin (CRESTOR) 5 MG tablet Take 5 mg by mouth every evening.   Yes Historical Provider, MD    Family History History reviewed. No pertinent family history.  Social History Social History  Substance Use Topics  . Smoking status: Current Some Day Smoker  . Smokeless tobacco: Never Used  . Alcohol use No     Allergies   Codeine and Flexeril [cyclobenzaprine]   Review of Systems Review of Systems  Constitutional: Positive for chills and fever.  HENT: Negative for ear pain and sore throat.   Eyes: Negative for pain and visual disturbance.  Respiratory: Negative for cough and shortness of breath.   Cardiovascular: Negative for chest pain and palpitations.  Gastrointestinal: Negative for abdominal pain and vomiting.  Genitourinary: Positive for penile pain, penile swelling, scrotal swelling and testicular pain. Negative for dysuria and hematuria.  Musculoskeletal: Negative for arthralgias and back pain.  Skin: Positive for color change. Negative for rash.  Neurological: Negative for seizures and syncope.  All other systems reviewed and are negative.    Physical Exam Updated Vital Signs BP 124/81 (BP Location: Left Arm)   Pulse 112   Temp 98.2 F (36.8 C) (Oral)   Resp Marland Kitchen)  30   SpO2 95%   Physical Exam  Constitutional: He is oriented to person, place, and time. He appears well-developed and well-nourished. No distress.  HENT:  Head: Normocephalic and atraumatic.  Nose: Nose normal.  Eyes: Conjunctivae and EOM are normal. Pupils are equal, round, and reactive to light. Right eye exhibits no discharge. Left eye exhibits no discharge. No scleral icterus.  Neck: Normal range  of motion. Neck supple.  Cardiovascular: Normal rate and regular rhythm.  Exam reveals no gallop and no friction rub.   No murmur heard. Pulmonary/Chest: Effort normal and breath sounds normal. No stridor. No respiratory distress. He has no rales.  Abdominal: Soft. He exhibits no distension. There is no tenderness.  Genitourinary: Right testis shows swelling and tenderness. Left testis shows swelling and tenderness. Uncircumcised. Penile erythema and penile tenderness present.  Genitourinary Comments: Significant swelling, erythema, tenderness to palpation of the perineal region from mons pubis down to bilateral gluteal cheeks. Area of induration on the right gluteal cheek and perineum. No crepitus palpable.  Musculoskeletal: He exhibits no edema or tenderness.  Neurological: He is alert and oriented to person, place, and time.  Skin: Skin is warm and dry. No rash noted. He is not diaphoretic. No erythema.  Psychiatric: He has a normal mood and affect.  Vitals reviewed.    ED Treatments / Results  Labs (all labs ordered are listed, but only abnormal results are displayed) Labs Reviewed  COMPREHENSIVE METABOLIC PANEL - Abnormal; Notable for the following:       Result Value   Sodium 124 (*)    Chloride 88 (*)    Glucose, Bld 208 (*)    BUN 27 (*)    Creatinine, Ser 1.34 (*)    Calcium 8.5 (*)    Albumin 2.8 (*)    ALT 16 (*)    GFR calc non Af Amer 54 (*)    All other components within normal limits  CBC WITH DIFFERENTIAL/PLATELET - Abnormal; Notable for the following:    WBC 17.0 (*)    HCT 38.1 (*)    MCHC 36.5 (*)    Neutro Abs 14.9 (*)    Monocytes Absolute 1.4 (*)    All other components within normal limits  GLUCOSE, CAPILLARY - Abnormal; Notable for the following:    Glucose-Capillary 209 (*)    All other components within normal limits  I-STAT CG4 LACTIC ACID, ED - Abnormal; Notable for the following:    Lactic Acid, Venous 3.01 (*)    All other components within  normal limits  CULTURE, BLOOD (ROUTINE X 2)  CULTURE, BLOOD (ROUTINE X 2)  URINE CULTURE  ANAEROBIC CULTURE  GRAM STAIN  AEROBIC CULTURE (SUPERFICIAL SPECIMEN)  FUNGUS STAIN  URINALYSIS, ROUTINE W REFLEX MICROSCOPIC (NOT AT [facility])  HEMOGLOBIN A1C  CBC  CREATININE, SERUM  PHOSPHORUS  MAGNESIUM  LACTIC ACID, PLASMA  I-STAT CG4 LACTIC ACID, ED  SURGICAL PATHOLOGY    EKG  EKG Interpretation  Date/Time:  Friday March 14 2016 10:51:51 EDT Ventricular Rate:  113 PR Interval:    QRS Duration: 84 QT Interval:  302 QTC Calculation: 414 R Axis:   -56 Text Interpretation:  Sinus tachycardia Left anterior fascicular block Probable anteroseptal infarct, old No old tracing to compare Confirmed by [facility] MD, Kandance Yano [phone]) on 03/14/2016 4:11:29 PM       Radiology Ct Abdomen Pelvis W Contrast  Result Date: 03/14/2016 CLINICAL DATA:  65 year old male with diabetes mellitus and scrotal infection with fever and erythema  spreading to the right buttock. Clinical concern for Fournier's gangrene. EXAM: CT ABDOMEN AND PELVIS WITH CONTRAST TECHNIQUE: Multidetector CT imaging of the abdomen and pelvis was performed using the standard protocol following bolus administration of intravenous contrast. CONTRAST:  125mL ISOVUE-300 IOPAMIDOL (ISOVUE-300) INJECTION 61% COMPARISON:  None. FINDINGS: Lower chest: No significant pulmonary nodules or acute consolidative airspace disease. Centrilobular emphysema at the lung bases. Coronary atherosclerosis. Hepatobiliary: Diffuse hepatic steatosis. No liver mass. Normal gallbladder with no radiopaque cholelithiasis. No biliary ductal dilatation. Pancreas: Normal, with no mass or duct dilation. Spleen: Normal size. No mass. Adrenals/Urinary Tract: Normal adrenals. No hydronephrosis. Hypodense 0.6 cm renal cortical lesion in the lateral interpolar left kidney, too small to characterize, for which no further follow-up is required. No additional renal lesions. Normal  bladder. Stomach/Bowel: Grossly normal stomach. Normal caliber small bowel with no small bowel wall thickening. Normal appendix. Normal large bowel with no diverticulosis, large bowel wall thickening or pericolonic fat stranding. Vascular/Lymphatic: Atherosclerotic nonaneurysmal abdominal aorta. Patent portal, splenic, hepatic and renal veins. Bilateral inguinal adenopathy measuring up to 2.1 cm on the right (series 2/ image 78) and 2.0 cm on the left (series 2/ image 77). No additional pathologically enlarged abdominopelvic nodes. Reproductive: Normal size prostate with nonspecific internal prostatic calcifications. Symmetric normal size seminal vesicles. Large right hydrocele measuring 7.5 x 6.4 cm (series 3/image 18). No left hydrocele. Severe thickening of the skin and subcutaneous soft tissues of the entire scrotum. Skin thickening, subcutaneous fat stranding and extensive subcutaneous emphysema throughout the perineum and bilateral medial buttocks, extending into the right ischial rectal fossa and deep right upper scrotal soft tissues. Subcutaneous abscess in the right perineum measuring 4.1 x 2.7 x 3.1 cm (series 2/image 99), demonstrating air-fluid level and thick enhancing wall. No additional focal drainable fluid collections in the abdomen or pelvis. Other: No pneumoperitoneum, ascites or focal fluid collection. Musculoskeletal: No aggressive appearing focal osseous lesions. Mild-to-moderate thoracolumbar spondylosis. IMPRESSION: 1. CT findings consistent with Fournier's gangrene, with extensive skin thickening, subcutaneous fat stranding and subcutaneous emphysema throughout the perineum, bilateral medial buttocks, right ischiorectal fossa and scrotum. 2. Subcutaneous abscess in the right perineum. Large right hydrocele. No ascites or intraperitoneal gas or fluid collections. 3. Additional findings include aortic atherosclerosis, coronary atherosclerosis, centrilobular emphysema and diffuse hepatic  steatosis. These results were called by telephone at the time of interpretation on 03/14/2016 at 12:29 pm to Dr. Addison Lank , who verbally acknowledged these results. Electronically Signed   By: [name] M.D.   On: 03/14/2016 12:32   Dg Chest Port 1 View  Result Date: 03/14/2016 CLINICAL DATA:  Sepsis.  Smoker. EXAM: PORTABLE CHEST 1 VIEW COMPARISON:  06/27/2010 . FINDINGS: Mediastinum and hilar structures normal. Mild cardiomegaly. Mild right base infiltrate cannot be excluded. No pleural effusion or pneumothorax . No acute bony abnormality. IMPRESSION: 1. Mild right base infiltrate cannot be excluded. 2. Mild cardiomegaly. Electronically Signed   By: Marcello Moores  Register   On: 03/14/2016 12:40    Procedures Procedures (including critical care time)  CRITICAL CARE Performed by: Grayce Sessions Ladona Rosten Total critical care time: 35 minutes Critical care time was exclusive of separately billable procedures and treating other patients. Critical care was necessary to treat or prevent imminent or life-threatening deterioration. Critical care was time spent personally by me on the following activities: development of treatment plan with patient and/or surrogate as well as nursing, discussions with consultants, evaluation of patient's response to treatment, examination of patient, obtaining history from patient or surrogate, ordering  and performing treatments and interventions, ordering and review of laboratory studies, ordering and review of radiographic studies, pulse oximetry and re-evaluation of patient's condition.   Medications Ordered in ED Medications  aspirin EC tablet 325 mg ( Oral Automatically Held 03/22/16 1800)  colesevelam [facility]) tablet 1,250 mg ( Oral Automatically Held 03/22/16 1700)  fenofibrate tablet 160 mg ( Oral Automatically Held 03/22/16 1000)  rosuvastatin (CRESTOR) tablet 5 mg ( Oral Automatically Held 03/22/16 1800)  heparin injection 5,000 Units ( Subcutaneous  Automatically Held 03/23/16 2200)  0.9 % NaCl with KCl 20 mEq/ L  infusion (not administered)  acetaminophen (TYLENOL) tablet 650 mg ( Oral MAR Hold 03/14/16 1346)    Or  acetaminophen (TYLENOL) suppository 650 mg ( Rectal MAR Hold 03/14/16 1346)  zolpidem (AMBIEN) tablet 5 mg ( Oral MAR Hold 03/14/16 1346)  senna-docusate (Senokot-S) tablet 1 tablet ( Oral MAR Hold 03/14/16 1346)  ondansetron (ZOFRAN) tablet 4 mg ( Oral MAR Hold 03/14/16 1346)    Or  ondansetron (ZOFRAN) injection 4 mg ( Intravenous MAR Hold 03/14/16 1346)  insulin glargine (LANTUS) injection 8 Units ( Subcutaneous Automatically Held 03/23/16 1000)  insulin aspart (novoLOG) injection 0-9 Units ( Subcutaneous Automatically Held 03/22/16 1700)  insulin aspart (novoLOG) injection 3 Units ( Subcutaneous Automatically Held 03/23/16 1700)  piperacillin-tazobactam (ZOSYN) IVPB 3.375 g ( Intravenous Automatically Held 03/22/16 2000)  vancomycin (VANCOCIN) IVPB 750 mg/150 ml premix ( Intravenous Automatically Held 03/22/16 2200)  ipratropium-albuterol (DUONEB) 0.5-2.5 (3) MG/3ML nebulizer solution 3 mL ( Nebulization Automatically Held 03/22/16 2000)  0.9 %  sodium chloride infusion ( Intravenous New Bag/Given 03/14/16 1500)  sodium chloride 0.9 % bolus 1,000 mL (0 mLs Intravenous Stopped 03/14/16 1150)    And  sodium chloride 0.9 % bolus 1,000 mL (1,000 mLs Intravenous New Bag/Given 03/14/16 1150)    And  sodium chloride 0.9 % bolus 1,000 mL (0 mLs Intravenous Stopped 03/14/16 1246)  clindamycin (CLEOCIN) IVPB 900 mg (0 mg Intravenous Stopped 03/14/16 1246)  HYDROmorphone (DILAUDID) injection 1 mg (1 mg Intravenous Given 03/14/16 1100)  iopamidol (ISOVUE-300) 61 % injection 30 mL (30 mLs Oral Contrast Given 03/14/16 1025)  piperacillin-tazobactam (ZOSYN) IVPB 3.375 g (0 g Intravenous Stopped 03/14/16 1304)  vancomycin (VANCOCIN) IVPB 1000 mg/200 mL premix (1,000 mg Intravenous New Bag/Given 03/14/16 1248)  iopamidol (ISOVUE-300) 61 % injection 100 mL  (100 mLs Intravenous Contrast Given 03/14/16 1157)  ipratropium-albuterol (DUONEB) 0.5-2.5 (3) MG/3ML nebulizer solution 3 mL (3 mLs Nebulization Given 03/14/16 1315)  acetaminophen (OFIRMEV) IV 1,000 mg (1,000 mg Intravenous Given 03/14/16 1524)     Initial Impression / Assessment and Plan / ED Course  I have reviewed the triage vital signs and the nursing notes.  Pertinent labs & imaging results that were available during my care of the patient were reviewed by me and considered in my medical decision making (see chart for details).  Clinical Course    Presentation consistent with Fournier's disease. Patient with evidence of sepsis. Empiric antibiotics and 30 mL/kg of IV fluids initiated. Pain medicine given. urology already on board. Consulted general surgery at the request of urology. Patient will be admitted to the ICU while awaiting for surgery under the care of the hospitalist team.    Final Clinical Impressions(s) / ED Diagnoses   Final diagnoses:  Sepsis ([place])  Fournier disease  Lactic acidosis  Hyperglycemia  AKI (acute kidney injury) ([place])  Leukocytosis    Disposition: Admit  Condition: serious    Fatima Blank, MD 03/14/16 1611

## 2016-03-14 NOTE — Consult Note (Signed)
General Surgery [name]  Reason for Consult: perineal infection, possible Fournier's gangrene  Referring Physician: Dr. Nicki Reaper McDiarmid, Urology  Jonathan Tucker is an 65 y.o. male.  HPI: patient is a 65 yo WM with hx of minor perineal infections.  Patient is non-insulin diabetic.  Developed symptoms 5 days ago which have progressed.  No spontaneous drainage.  Increasing pain.  Presented to urology this AM.  Febrile, elevated WBC, erythema and induration on perineum suspicious for scrotal or perineal infection.  Sent to ER by Dr. McDiarmid for sepsis protocol and evaluation by medical service.  General surgery asked to evaluate and be available if infectious process is more extensive than scrotal in nature.  Discussed at bedside with patient's wife and daughter.  Past Medical History:  Diagnosis Date  . Diabetes mellitus without complication ([place])     History reviewed. No pertinent surgical history.  History reviewed. No pertinent family history.  Social History:  reports that he has been smoking.  He has never used smokeless tobacco. He reports that he does not drink alcohol. His drug history is not on file.  Allergies:  Allergies  Allergen Reactions  . Codeine     Does not tolerate, tolerates hydrocodone   . Flexeril [Cyclobenzaprine]     Burning/itching    Medications: I have reviewed the patient's current medications.  Results for orders placed or performed during the hospital encounter of 03/14/16 (from the past 48 hour(s))  Comprehensive metabolic panel     Status: Abnormal   Collection Time: 03/14/16 10:34 AM  Result Value Ref Range   Sodium 124 (L) 135 - 145 mmol/L   Potassium 3.7 3.5 - 5.1 mmol/L   Chloride 88 (L) 101 - 111 mmol/L   CO2 24 22 - 32 mmol/L   Glucose, Bld 208 (H) 65 - 99 mg/dL   BUN 27 (H) 6 - 20 mg/dL   Creatinine, Ser 1.34 (H) 0.61 - 1.24 mg/dL   Calcium 8.5 (L) 8.9 - 10.3 mg/dL   Total Protein 6.6 6.5 - 8.1 g/dL   Albumin  2.8 (L) 3.5 - 5.0 g/dL   AST 20 15 - 41 U/L   ALT 16 (L) 17 - 63 U/L   Alkaline Phosphatase 58 38 - 126 U/L   Total Bilirubin 0.9 0.3 - 1.2 mg/dL   GFR calc non Af Amer 54 (L) >60 mL/min   GFR calc Af Amer >60 >60 mL/min    Comment: (NOTE) The eGFR has been calculated using the CKD EPI equation. This calculation has not been validated in all clinical situations. eGFR's persistently <60 mL/min signify possible Chronic Kidney Disease.    Anion gap 12 5 - 15  CBC WITH DIFFERENTIAL     Status: Abnormal   Collection Time: 03/14/16 10:34 AM  Result Value Ref Range   WBC 17.0 (H) 4.0 - 10.5 K/uL   RBC 4.43 4.22 - 5.81 MIL/uL   Hemoglobin 13.9 13.0 - 17.0 g/dL   HCT 38.1 (L) 39.0 - 52.0 %   MCV 86.0 78.0 - 100.0 fL   MCH 31.4 26.0 - 34.0 pg   MCHC 36.5 (H) 30.0 - 36.0 g/dL   RDW 13.2 11.5 - 15.5 %   Platelets 169 150 - 400 K/uL   Neutrophils Relative % 88 %   Lymphocytes Relative 4 %   Monocytes Relative 8 %   Eosinophils Relative 0 %   Basophils Relative 0 %   Neutro Abs 14.9 (H) 1.7 - 7.7  K/uL   Lymphs Abs 0.7 0.7 - 4.0 K/uL   Monocytes Absolute 1.4 (H) 0.1 - 1.0 K/uL   Eosinophils Absolute 0.0 0.0 - 0.7 K/uL   Basophils Absolute 0.0 0.0 - 0.1 K/uL   WBC Morphology INCREASED BANDS (>20% BANDS)     Comment: TOXIC GRANULATION VACUOLATED NEUTROPHILS   I-Stat CG4 Lactic Acid, ED  (not at  [facility])     Status: Abnormal   Collection Time: 03/14/16 10:42 AM  Result Value Ref Range   Lactic Acid, Venous 3.01 (HH) 0.5 - 1.9 mmol/L   Comment NOTIFIED PHYSICIAN     No results found.  Review of Systems  Constitutional: Positive for chills, diaphoresis and fever.  HENT: Negative.   Eyes: Negative.   Respiratory: Negative.   Cardiovascular: Negative.   Gastrointestinal: Negative.   Genitourinary:       Pain and swelling in scrotum and buttocks  Musculoskeletal: Negative.   Skin: Negative.   Neurological: Negative.   Endo/Heme/Allergies: Negative.   Psychiatric/Behavioral:  Negative.    Blood pressure 126/82, pulse (!) 57, temperature 98.2 F (36.8 C), temperature source Oral, resp. rate 15, weight 86.2 kg (190 lb), SpO2 93 %. Physical Exam  Constitutional: He is oriented to person, place, and time. He appears well-developed and well-nourished. No distress.  HENT:  Head: Normocephalic and atraumatic.  Right Ear: External ear normal.  Left Ear: External ear normal.  Eyes: Conjunctivae are normal. Pupils are equal, round, and reactive to light. No scleral icterus.  Neck: Normal range of motion. Neck supple. No tracheal deviation present. No thyromegaly present.  Cardiovascular: Regular rhythm and normal heart sounds.   No murmur heard. Respiratory: Effort normal. No respiratory distress. He has wheezes (expiratory). He has no rales.  GI: Soft. Bowel sounds are normal. He exhibits no distension and no mass. There is no tenderness. There is no rebound and no guarding.  Genitourinary: Penis normal.  Genitourinary Comments: Enlarged, indurated and tender scrotum; induration and erythema extending onto perineum and along medial right buttock; no drainage  Musculoskeletal: Normal range of motion. He exhibits no edema or deformity.  Neurological: He is alert and oriented to person, place, and time.  Skin: Skin is warm and dry. He is not diaphoretic.  Psychiatric: He has a normal mood and affect. His behavior is normal.    Assessment/Plan: Scrotal abscess vs Fournier's gangrene of perineum Sepsis syndrome NIDDM  Agree with sepsis protocol and initiation of broad abx coverage  CT scan pending to establish extent of infection and need for debridement  OR scheduled later this afternoon with urology  Discussed with wife and daughter.  Discussed possibility of more extensive debridement depending on operative findings.  The risks and benefits of the procedure have been discussed at length with the patient.  The patient understands the proposed procedure, potential  alternative treatments, and the course of recovery to be expected.  All of the patient's questions have been answered at this time.  The patient wishes to proceed with surgery.  [name], MD, [name] Office: [place] 03/14/2016, 11:54 AM

## 2016-03-14 NOTE — ED Notes (Signed)
MD at bedside. 

## 2016-03-14 NOTE — ED Triage Notes (Addendum)
Pt reports infection in scrotum going up to hip. Sent over by urology for this. Intermittent fever since Sunday which was when he first noticed abscess. Pt's scrotum reddened and swollen. Redness spreading to R buttock.

## 2016-03-15 LAB — COMPREHENSIVE METABOLIC PANEL
ALK PHOS: 39 U/L (ref 38–126)
ALT: 10 U/L — AB (ref 17–63)
AST: 22 U/L (ref 15–41)
Albumin: 1.9 g/dL — ABNORMAL LOW (ref 3.5–5.0)
Anion gap: 6 (ref 5–15)
BILIRUBIN TOTAL: 1.2 mg/dL (ref 0.3–1.2)
BUN: 22 mg/dL — ABNORMAL HIGH (ref 6–20)
CALCIUM: 6.7 mg/dL — AB (ref 8.9–10.3)
CO2: 21 mmol/L — AB (ref 22–32)
CREATININE: 1.08 mg/dL (ref 0.61–1.24)
Chloride: 100 mmol/L — ABNORMAL LOW (ref 101–111)
Glucose, Bld: 167 mg/dL — ABNORMAL HIGH (ref 65–99)
Potassium: 4.2 mmol/L (ref 3.5–5.1)
SODIUM: 127 mmol/L — AB (ref 135–145)
TOTAL PROTEIN: 4.3 g/dL — AB (ref 6.5–8.1)

## 2016-03-15 LAB — GLUCOSE, CAPILLARY
GLUCOSE-CAPILLARY: 152 mg/dL — AB (ref 65–99)
GLUCOSE-CAPILLARY: 185 mg/dL — AB (ref 65–99)
Glucose-Capillary: 212 mg/dL — ABNORMAL HIGH (ref 65–99)
Glucose-Capillary: 156 mg/dL — ABNORMAL HIGH (ref 65–99)

## 2016-03-15 LAB — CBC WITH DIFFERENTIAL/PLATELET
BASOS PCT: 1 %
Basophils Absolute: 0.1 10*3/uL (ref 0.0–0.1)
EOS ABS: 0 10*3/uL (ref 0.0–0.7)
Eosinophils Relative: 0 %
HCT: 30.5 % — ABNORMAL LOW (ref 39.0–52.0)
Hemoglobin: 10.5 g/dL — ABNORMAL LOW (ref 13.0–17.0)
Lymphocytes Relative: 5 %
Lymphs Abs: 0.7 10*3/uL (ref 0.7–4.0)
MCH: 31 pg (ref 26.0–34.0)
MCHC: 34.4 g/dL (ref 30.0–36.0)
MCV: 90 fL (ref 78.0–100.0)
MONO ABS: 1 10*3/uL (ref 0.1–1.0)
Monocytes Relative: 7 %
NEUTROS PCT: 87 %
Neutro Abs: 12.4 10*3/uL — ABNORMAL HIGH (ref 1.7–7.7)
PLATELETS: 181 10*3/uL (ref 150–400)
RBC: 3.39 MIL/uL — ABNORMAL LOW (ref 4.22–5.81)
RDW: 13.8 % (ref 11.5–15.5)
WBC: 14.2 10*3/uL — ABNORMAL HIGH (ref 4.0–10.5)

## 2016-03-15 LAB — MAGNESIUM: MAGNESIUM: 1.6 mg/dL — AB (ref 1.7–2.4)

## 2016-03-15 LAB — HEMOGLOBIN A1C
HEMOGLOBIN A1C: 6.5 % — AB (ref 4.8–5.6)
MEAN PLASMA GLUCOSE: 140 mg/dL

## 2016-03-15 LAB — LACTIC ACID, PLASMA: Lactic Acid, Venous: 1.3 mmol/L (ref 0.5–1.9)

## 2016-03-15 LAB — PHOSPHORUS: PHOSPHORUS: 2.9 mg/dL (ref 2.5–4.6)

## 2016-03-15 MED ORDER — FENTANYL CITRATE (PF) 100 MCG/2ML IJ SOLN
INTRAMUSCULAR | Status: AC
  Administered 2016-03-15: 25 ug via INTRAVENOUS
  Filled 2016-03-15: qty 2

## 2016-03-15 MED ORDER — IPRATROPIUM-ALBUTEROL 0.5-2.5 (3) MG/3ML IN SOLN
3.0000 mL | Freq: Three times a day (TID) | RESPIRATORY_TRACT | Status: DC
  Administered 2016-03-16 – 2016-03-18 (×7): 3 mL via RESPIRATORY_TRACT
  Filled 2016-03-15 (×8): qty 3

## 2016-03-15 MED ORDER — FENTANYL CITRATE (PF) 100 MCG/2ML IJ SOLN
12.5000 ug | INTRAMUSCULAR | Status: DC | PRN
  Administered 2016-03-15: 12.5 ug via INTRAVENOUS
  Administered 2016-03-15 – 2016-03-16 (×4): 25 ug via INTRAVENOUS
  Administered 2016-03-17 – 2016-03-19 (×9): 12.5 ug via INTRAVENOUS
  Administered 2016-03-19: 25 ug via INTRAVENOUS
  Filled 2016-03-15 (×16): qty 2

## 2016-03-15 MED ORDER — SODIUM CHLORIDE 0.9 % IV BOLUS (SEPSIS)
1000.0000 mL | Freq: Once | INTRAVENOUS | Status: AC
  Administered 2016-03-15: 1000 mL via INTRAVENOUS

## 2016-03-15 MED ORDER — IPRATROPIUM-ALBUTEROL 0.5-2.5 (3) MG/3ML IN SOLN
3.0000 mL | RESPIRATORY_TRACT | Status: DC | PRN
  Administered 2016-03-15: 3 mL via RESPIRATORY_TRACT
  Filled 2016-03-15: qty 3

## 2016-03-15 NOTE — Progress Notes (Signed)
[name]  03/15/2016  Assessment & Plan: Fournier's gangrene  Patient much improved, more comfortable  IV abx  OK to allow oral diet today  Plan return to OR in AM 10/1 with urology for dressing change  I plan to assist in OR with dressing change        Jonathan Regal, MD, [name]       Office: [phone]    Subjective: Patient alert and oriented, wife at bedside.  Less pain.  Objective: Vital signs in last 24 hours: Temp:  [97.5 F (36.4 C)-98.9 F (37.2 C)] 98.9 F (37.2 C) (09/30 0800) Pulse Rate:  [52-112] 57 (09/30 0600) Resp:  [14-30] 19 (09/30 0600) BP: (62-126)/(29-82) 102/67 (09/30 0600) SpO2:  [89 %-100 %] 96 % (09/30 0759) Last BM Date:  (pta)  Intake/Output from previous day: 09/29 0701 - 09/30 0700 In: 4209.3 [I.V.:1859.3; IV Piggyback:2350] Out: 2650 [Urine:2550; Blood:100] Intake/Output this shift: Total I/O In: -  Out: 500 [Urine:500]  Physical Exam: HEENT - sclerae clear, mucous membranes moist GU - mesh holding dry ABD pads in place Neuro - alert & oriented, no focal deficits  Lab Results:   Recent Labs  03/14/16 1034 03/15/16 0122  WBC 17.0* 14.2*  HGB 13.9 10.5*  HCT 38.1* 30.5*  PLT 169 181   BMET  Recent Labs  03/14/16 1034 03/15/16 0122  NA 124* 127*  K 3.7 4.2  CL 88* 100*  CO2 24 21*  GLUCOSE 208* 167*  BUN 27* 22*  CREATININE 1.34* 1.08  CALCIUM 8.5* 6.7*   PT/INR No results for input(s): LABPROT, INR in the last 72 hours. Comprehensive Metabolic Panel:    Component Value Date/Time   NA 127 (L) 03/15/2016 0122   NA 124 (L) 03/14/2016 1034   K 4.2 03/15/2016 0122   K 3.7 03/14/2016 1034   CL 100 (L) 03/15/2016 0122   CL 88 (L) 03/14/2016 1034   CO2 21 (L) 03/15/2016 0122   CO2 24 03/14/2016 1034   BUN 22 (H) 03/15/2016 0122   BUN 27 (H) 03/14/2016 1034   CREATININE 1.08 03/15/2016 0122   CREATININE 1.34 (H) 03/14/2016 1034   GLUCOSE 167 (H) 03/15/2016 0122   GLUCOSE 208 (H) 03/14/2016 1034   CALCIUM 6.7 (L) 03/15/2016 0122   CALCIUM 8.5 (L) 03/14/2016 1034   AST 22 03/15/2016 0122   AST 20 03/14/2016 1034   ALT 10 (L) 03/15/2016 0122   ALT 16 (L) 03/14/2016 1034   ALKPHOS 39 03/15/2016 0122   ALKPHOS 58 03/14/2016 1034   BILITOT 1.2 03/15/2016 0122   BILITOT 0.9 03/14/2016 1034   PROT 4.3 (L) 03/15/2016 0122   PROT 6.6 03/14/2016 1034   ALBUMIN 1.9 (L) 03/15/2016 0122   ALBUMIN 2.8 (L) 03/14/2016 1034    Studies/Results: Ct Abdomen Pelvis W Contrast  Result Date: 03/14/2016 CLINICAL DATA:  65 year old male with diabetes mellitus and scrotal infection with fever and erythema spreading to the right buttock. Clinical concern for Fournier's gangrene. EXAM: CT ABDOMEN AND PELVIS WITH CONTRAST TECHNIQUE: Multidetector CT imaging of the abdomen and pelvis was performed using the standard protocol following bolus administration of intravenous contrast. CONTRAST:  118mL ISOVUE-300 IOPAMIDOL (ISOVUE-300) INJECTION 61% COMPARISON:  None. FINDINGS: Lower chest: No significant pulmonary nodules or acute consolidative airspace disease. Centrilobular emphysema at the lung bases. Coronary atherosclerosis. Hepatobiliary: Diffuse hepatic steatosis. No liver mass. Normal gallbladder with no radiopaque cholelithiasis. No  biliary ductal dilatation. Pancreas: Normal, with no mass or duct dilation. Spleen: Normal size. No mass. Adrenals/Urinary Tract: Normal adrenals. No hydronephrosis. Hypodense 0.6 cm renal cortical lesion in the lateral interpolar left kidney, too small to characterize, for which no further follow-up is required. No additional renal lesions. Normal bladder. Stomach/Bowel: Grossly normal stomach. Normal caliber small bowel with no small bowel wall thickening. Normal appendix. Normal large bowel with no diverticulosis, large bowel wall thickening or pericolonic fat stranding. Vascular/Lymphatic: Atherosclerotic  nonaneurysmal abdominal aorta. Patent portal, splenic, hepatic and renal veins. Bilateral inguinal adenopathy measuring up to 2.1 cm on the right (series 2/ image 78) and 2.0 cm on the left (series 2/ image 77). No additional pathologically enlarged abdominopelvic nodes. Reproductive: Normal size prostate with nonspecific internal prostatic calcifications. Symmetric normal size seminal vesicles. Large right hydrocele measuring 7.5 x 6.4 cm (series 3/image 18). No left hydrocele. Severe thickening of the skin and subcutaneous soft tissues of the entire scrotum. Skin thickening, subcutaneous fat stranding and extensive subcutaneous emphysema throughout the perineum and bilateral medial buttocks, extending into the right ischial rectal fossa and deep right upper scrotal soft tissues. Subcutaneous abscess in the right perineum measuring 4.1 x 2.7 x 3.1 cm (series 2/image 99), demonstrating air-fluid level and thick enhancing wall. No additional focal drainable fluid collections in the abdomen or pelvis. Other: No pneumoperitoneum, ascites or focal fluid collection. Musculoskeletal: No aggressive appearing focal osseous lesions. Mild-to-moderate thoracolumbar spondylosis. IMPRESSION: 1. CT findings consistent with Fournier's gangrene, with extensive skin thickening, subcutaneous fat stranding and subcutaneous emphysema throughout the perineum, bilateral medial buttocks, right ischiorectal fossa and scrotum. 2. Subcutaneous abscess in the right perineum. Large right hydrocele. No ascites or intraperitoneal gas or fluid collections. 3. Additional findings include aortic atherosclerosis, coronary atherosclerosis, centrilobular emphysema and diffuse hepatic steatosis. These results were called by telephone at the time of interpretation on 03/14/2016 at 12:29 pm to Dr. Addison Lank , who verbally acknowledged these results. Electronically Signed   By: [name] M.D.   On: 03/14/2016 12:32   Dg Chest Port 1 View  Result  Date: 03/14/2016 CLINICAL DATA:  Sepsis.  Smoker. EXAM: PORTABLE CHEST 1 VIEW COMPARISON:  06/27/2010 . FINDINGS: Mediastinum and hilar structures normal. Mild cardiomegaly. Mild right base infiltrate cannot be excluded. No pleural effusion or pneumothorax . No acute bony abnormality. IMPRESSION: 1. Mild right base infiltrate cannot be excluded. 2. Mild cardiomegaly. Electronically Signed   By: Marcello Moores  Register   On: 03/14/2016 12:40      [place] M 03/15/2016  [name], male   DOB: 12/03/1950, 65 y.o.   MRN: [id number]

## 2016-03-15 NOTE — Progress Notes (Signed)
[facility] Low BP. Fluid bolus. Checking mg & phos & EKG due to arrythmia. No CP or SOB per nursing.  Elwin Mocha MD

## 2016-03-15 NOTE — Progress Notes (Addendum)
[name], male   DOB: 1951/03/08, 65 y.o.   MRN: [id number]  PROGRESS NOTE    Jonathan Tucker  PIR:[id number] DOB: 11-15-1950 DOA: 03/14/2016  PCP: [name],[name], PA-C   Brief Narrative:  65 y.o. male with past medical history of type 2 diabetes mellitus (not on insulin), dyslipidemia, hypertension who presented to [facility] ED with perineal infection. He was seen by urology prior to the admission and was referred for admission due to concern for Fournier's gangrene.  In ED, BP was 62/29, HR 116, RR 30, T max 98.6 F and oxygen saturation 89-100%. Blood work was notable for WBC count 17, sodium 124, lactic acid 3.01. CT scan concerning for Fournier's gangrene.. Pt seen by surgery in consultation and underwent I&D of the perineal and scrotal abscess 9/29.  Assessment & Plan:   Principal Problem:   Sepsis secondary to Fournier's gangrene / Perineal and scrotal abscess (HCC) / Lactic acidosis / Leukocytosis  - Sepsis criteria met on admission with hypotension, tachycardia, tachypnea, leukocytosis an dlactic acidosis. 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 without long term insulin use - A1c on this admission 6.5, indicates good glycemic control - Continue SSI in hospital    Hyponatremia - Due to sepsis - Improved with IV fluids, 124 --> 127    Acute renal failure (ARF) (HCC) - Due to sepsis - Cr normalized with IV fluids     Acute blood loss anemia - Hgb dropped from 13.9 to 10.5, possibly post procedural - Continue to monitor CBC daily     Dyslipidemia associated with type 2 DM - Continue Welchol, fenofibrate, Crestor      Essential hypertension - BP meds on hold de to soft BP   DVT prophylaxis: SCD's bilaterally  Code Status: full  code  Family Communication: wife at the bedside this am Disposition Plan: remains in SDU for next 24 hours due to sepsis    Consultants:   Surgery   Procedures:   I&D of the perineal and scrotal abscess 03/14/2016  Antimicrobials:   Vanco and zosyn 03/14/2016 -->    Subjective: No overnight events.   Objective: Vitals:   03/15/16 0500 03/15/16 0600 03/15/16 0759 03/15/16 0800  BP: (!) 100/44 102/67    Pulse: 94 (!) 57    Resp: 18 19    Temp:    98.9 F (37.2 C)  TempSrc:    Oral  SpO2: 97% 92% 96%   Weight:      Height:        Intake/Output Summary (Last 24 hours) at 03/15/16 1046 Last data filed at 03/15/16 1019  Gross per 24 hour  Intake          4209.33 ml  Output             3150 ml  Net          1059.33 ml   Filed Weights   03/14/16 1014  Weight: 86.2 kg (190 lb)    Examination:  General exam: Appears calm and comfortable  Respiratory system: Clear to auscultation. Respiratory effort normal. Cardiovascular system: S1 & S2 heard, RRR. No pedal edema. Gastrointestinal system: Abdomen is nondistended, soft and nontender. No organomegaly or masses felt. Normal bowel sounds heard. Central nervous system: Alert and oriented. No  focal neurological deficits. Extremities: Symmetric 5 x 5 power. Skin: perineal and scrotal abscess  Psychiatry: Judgement and insight appear normal. Mood & affect appropriate.   Data Reviewed: I have personally reviewed following labs and imaging studies  CBC:  Recent Labs Lab 03/14/16 1034 03/15/16 0122  WBC 17.0* 14.2*  NEUTROABS 14.9* 12.4*  HGB 13.9 10.5*  HCT 38.1* 30.5*  MCV 86.0 90.0  PLT 169 094   Basic Metabolic Panel:  Recent Labs Lab 03/14/16 1034 03/15/16 0122  NA 124* 127*  K 3.7 4.2  CL 88* 100*  CO2 24 21*  GLUCOSE 208* 167*  BUN 27* 22*  CREATININE 1.34* 1.08  CALCIUM 8.5* 6.7*  MG  --  1.6*  PHOS  --  2.9   GFR: Estimated Creatinine Clearance: 73.8 mL/min (by C-G formula based on SCr of  1.08 mg/dL). Liver Function Tests:  Recent Labs Lab 03/14/16 1034 03/15/16 0122  AST 20 22  ALT 16* 10*  ALKPHOS 58 39  BILITOT 0.9 1.2  PROT 6.6 4.3*  ALBUMIN 2.8* 1.9*   No results for input(s): LIPASE, AMYLASE in the last 168 hours. No results for input(s): AMMONIA in the last 168 hours. Coagulation Profile: No results for input(s): INR, PROTIME in the last 168 hours. Cardiac Enzymes: No results for input(s): CKTOTAL, CKMB, CKMBINDEX, TROPONINI in the last 168 hours. BNP (last 3 results) No results for input(s): PROBNP in the last 8760 hours. HbA1C:  Recent Labs  03/14/16 1034  HGBA1C 6.5*   CBG:  Recent Labs Lab 03/14/16 1406 03/14/16 1613 03/14/16 2300 03/15/16 0732  GLUCAP 209* 175* 191* 152*   Lipid Profile: No results for input(s): CHOL, HDL, LDLCALC, TRIG, CHOLHDL, LDLDIRECT in the last 72 hours. Thyroid Function Tests: No results for input(s): TSH, T4TOTAL, FREET4, T3FREE, THYROIDAB in the last 72 hours. Anemia Panel: No results for input(s): VITAMINB12, FOLATE, FERRITIN, TIBC, IRON, RETICCTPCT in the last 72 hours. Urine analysis:    Component Value Date/Time   COLORURINE YELLOW 03/14/2016 2002   APPEARANCEUR CLEAR 03/14/2016 2002   LABSPEC 1.028 03/14/2016 2002   PHURINE 6.0 03/14/2016 2002   GLUCOSEU NEGATIVE 03/14/2016 2002   HGBUR TRACE (A) 03/14/2016 2002   [place] NEGATIVE 03/14/2016 2002   KETONESUR NEGATIVE 03/14/2016 2002   PROTEINUR 30 (A) 03/14/2016 2002   NITRITE NEGATIVE 03/14/2016 2002   LEUKOCYTESUR NEGATIVE 03/14/2016 2002   Sepsis Labs: _0 (procalcitonin:4,lacticidven:4)    Recent Results (from the past 240 hour(s))  Aerobic Culture (superficial specimen)     Status: None (Preliminary result)   Collection Time: 03/14/16  3:01 PM  Result Value Ref Range Status   Specimen Description   Final    ABSCESS IRRIGATION AND DEBRIDEMENT SCROTAL ABCESS AND CYTOSCOPY   Special Requests NONE  Final   Gram Stain   Final     FEW WBC PRESENT, PREDOMINANTLY MONONUCLEAR ABUNDANT GRAM NEGATIVE RODS FEW GRAM POSITIVE COCCI IN PAIRS FEW GRAM POSITIVE RODS Performed at [facility]    Culture PENDING  Incomplete   Report Status PENDING  Incomplete  MRSA PCR Screening     Status: None   Collection Time: 03/14/16  5:32 PM  Result Value Ref Range Status   MRSA by PCR NEGATIVE NEGATIVE Final    Comment:        The GeneXpert MRSA Assay (FDA approved for NASAL specimens only), is one component of a comprehensive MRSA colonization surveillance program. It is not intended to diagnose MRSA infection nor to guide or monitor treatment  for MRSA infections.       Radiology Studies: Ct Abdomen Pelvis W Contrast Result Date: 03/14/2016  1. CT findings consistent with Fournier's gangrene, with extensive skin thickening, subcutaneous fat stranding and subcutaneous emphysema throughout the perineum, bilateral medial buttocks, right ischiorectal fossa and scrotum. 2. Subcutaneous abscess in the right perineum. Large right hydrocele. No ascites or intraperitoneal gas or fluid collections. 3. Additional findings include aortic atherosclerosis, coronary atherosclerosis, centrilobular emphysema and diffuse hepatic steatosis. These results were called by telephone at the time of interpretation on 03/14/2016 at 12:29 pm to Dr. Addison Lank , who verbally acknowledged these results. Electronically Signed   By: [name] M.D.   On: 03/14/2016 12:32   Dg Chest Port 1 View Result Date: 03/14/2016  1. Mild right base infiltrate cannot be excluded. 2. Mild cardiomegaly.      Scheduled Meds: . aspirin EC  325 mg Oral QPM  . colesevelam  1,250 mg Oral BID WC  . fenofibrate  160 mg Oral Daily  . insulin aspart  0-9 Units Subcutaneous TID WC  . insulin aspart  3 Units Subcutaneous TID WC  . insulin glargine  8 Units Subcutaneous Daily  . piperacillin-tazobactam (ZOSYN)  IV  3.375 g Intravenous Q8H  . rosuvastatin  5 mg  Oral QPM  . vancomycin  750 mg Intravenous Q12H   Continuous Infusions: . 0.9 % NaCl with KCl 20 mEq / L 140 mL/hr at 03/15/16 0818     LOS: 1 day    Time spent: 25 minutes  Greater than 50% of the time spent on counseling and coordinating the care.   Leisa Lenz, MD [facility] Pager [phone]  If 7PM-7AM, please contact night-coverage [email or web address] Password [email or web address] 03/15/2016, 10:46 AM

## 2016-03-15 NOTE — Consult Note (Signed)
Urology Consult Note   Requesting Attending Physician:  Robbie Lis, MD Service Providing Consult: Urology Consulting Attending: Matilde Sprang, MD  Assessment:  Patient is a 65 y.o. 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 S/p OR debridement of necrotic tissue 9/29 with Dr. Matilde Sprang and Dr. Harlow Asa.   Interval: Afebrile since Or. Stable Hr. Hypotensive as low as 62/29 overnight, given 1L NS bolus. No pressors. No pain meds available this morning but tylenol. 2.5L UOP via Foley. Cr 1.0 from 1.3. WBC 14 from 17. Urine culture pending. Culture from OR showing preliminary GNRs, GPCs, and GP rods.   Recommendations: 1. Continue broad spectrum antibiotics. Follow up OR cultures 2. Please make NPO at 12am 3. OK to continue ASA from surgical perspective 4. Will plan for 2nd look debridement tomorrow AM in OR with Dr. Matilde Sprang & Dr. Harlow Asa 5. Appreciate medicine's involvement in this patient's care, DM control/CAD, etc.  Thank you for this consult. Please contact the urology consult pager with any further questions/concerns. Sharmaine Base, MD Urology Surgical Resident  Subjective: Pain with manipulation of scrotal/perineal dressings as expected. Feeling sore this morning.   Objective   Vital signs in last 24 hours: BP (!) 99/44 (BP Location: Right Arm)   Pulse 89   Temp 98 F (36.7 C) (Oral)   Resp 18   Ht 6' (1.829 m)   Wt 190 lb (86.2 kg)   SpO2 94%   BMI 25.77 kg/m   Intake/Output last 3 shifts: I/O last 3 completed shifts: In: 4209.3 [I.V.:1859.3; IV Piggyback:2350] Out: 2650 [Urine:2550; Blood:100]  Physical Exam General: NAD, A&O HEENT: [place]/AT, EOMI, MMM Pulmonary: Normal work of breathing Cardiovascular: Regular rate & rhythm, HDS, adequate peripheral perfusion Abdomen: soft, NTTP, nondistended, no suprapubic fullness or tenderness GU: Foley draining yellow urine, bilateral testicles visible with overlying  tunica vaginalis, inferior scrotal and perineal (R>L) wound bed dressed with moist kerlix & covered with ABD pad Extremities: warm and well perfused, no edema   Most Recent Labs: Lab Results  Component Value Date   WBC 14.2 (H) 03/15/2016   HGB 10.5 (L) 03/15/2016   HCT 30.5 (L) 03/15/2016   PLT 181 03/15/2016    Lab Results  Component Value Date   NA 127 (L) 03/15/2016   K 4.2 03/15/2016   CL 100 (L) 03/15/2016   CO2 21 (L) 03/15/2016   BUN 22 (H) 03/15/2016   CREATININE 1.08 03/15/2016   CALCIUM 6.7 (L) 03/15/2016   MG 1.6 (L) 03/15/2016   PHOS 2.9 03/15/2016    Lab Results  Component Value Date   ALKPHOS 39 03/15/2016   BILITOT 1.2 03/15/2016   PROT 4.3 (L) 03/15/2016   ALBUMIN 1.9 (L) 03/15/2016   ALT 10 (L) 03/15/2016   AST 22 03/15/2016    No results found for: INR, APTT   Urine Culture: Pending   IMAGING: Ct Abdomen Pelvis W Contrast  Result Date: 03/14/2016 CLINICAL DATA:  65 year old male with diabetes mellitus and scrotal infection with fever and erythema spreading to the right buttock. Clinical concern for Fournier's gangrene. EXAM: CT ABDOMEN AND PELVIS WITH CONTRAST TECHNIQUE: Multidetector CT imaging of the abdomen and pelvis was performed using the standard protocol following bolus administration of intravenous contrast. CONTRAST:  129mL ISOVUE-300 IOPAMIDOL (ISOVUE-300) INJECTION 61% COMPARISON:  None. FINDINGS: Lower chest: No significant pulmonary nodules or acute consolidative airspace disease. Centrilobular emphysema at the lung bases. Coronary atherosclerosis. Hepatobiliary: Diffuse hepatic steatosis. No liver mass. Normal gallbladder with  no radiopaque cholelithiasis. No biliary ductal dilatation. Pancreas: Normal, with no mass or duct dilation. Spleen: Normal size. No mass. Adrenals/Urinary Tract: Normal adrenals. No hydronephrosis. Hypodense 0.6 cm renal cortical lesion in the lateral interpolar left kidney, too small to characterize, for which no  further follow-up is required. No additional renal lesions. Normal bladder. Stomach/Bowel: Grossly normal stomach. Normal caliber small bowel with no small bowel wall thickening. Normal appendix. Normal large bowel with no diverticulosis, large bowel wall thickening or pericolonic fat stranding. Vascular/Lymphatic: Atherosclerotic nonaneurysmal abdominal aorta. Patent portal, splenic, hepatic and renal veins. Bilateral inguinal adenopathy measuring up to 2.1 cm on the right (series 2/ image 78) and 2.0 cm on the left (series 2/ image 77). No additional pathologically enlarged abdominopelvic nodes. Reproductive: Normal size prostate with nonspecific internal prostatic calcifications. Symmetric normal size seminal vesicles. Large right hydrocele measuring 7.5 x 6.4 cm (series 3/image 18). No left hydrocele. Severe thickening of the skin and subcutaneous soft tissues of the entire scrotum. Skin thickening, subcutaneous fat stranding and extensive subcutaneous emphysema throughout the perineum and bilateral medial buttocks, extending into the right ischial rectal fossa and deep right upper scrotal soft tissues. Subcutaneous abscess in the right perineum measuring 4.1 x 2.7 x 3.1 cm (series 2/image 99), demonstrating air-fluid level and thick enhancing wall. No additional focal drainable fluid collections in the abdomen or pelvis. Other: No pneumoperitoneum, ascites or focal fluid collection. Musculoskeletal: No aggressive appearing focal osseous lesions. Mild-to-moderate thoracolumbar spondylosis. IMPRESSION: 1. CT findings consistent with Fournier's gangrene, with extensive skin thickening, subcutaneous fat stranding and subcutaneous emphysema throughout the perineum, bilateral medial buttocks, right ischiorectal fossa and scrotum. 2. Subcutaneous abscess in the right perineum. Large right hydrocele. No ascites or intraperitoneal gas or fluid collections. 3. Additional findings include aortic atherosclerosis, coronary  atherosclerosis, centrilobular emphysema and diffuse hepatic steatosis. These results were called by telephone at the time of interpretation on 03/14/2016 at 12:29 pm to Dr. Addison Lank , who verbally acknowledged these results. Electronically Signed   By: [name] M.D.   On: 03/14/2016 12:32   Dg Chest Port 1 View  Result Date: 03/14/2016 CLINICAL DATA:  Sepsis.  Smoker. EXAM: PORTABLE CHEST 1 VIEW COMPARISON:  06/27/2010 . FINDINGS: Mediastinum and hilar structures normal. Mild cardiomegaly. Mild right base infiltrate cannot be excluded. No pleural effusion or pneumothorax . No acute bony abnormality. IMPRESSION: 1. Mild right base infiltrate cannot be excluded. 2. Mild cardiomegaly. Electronically Signed   By: Marcello Moores  Register   On: 03/14/2016 12:40

## 2016-03-15 NOTE — Op Note (Signed)
NAMEBRITTON, HEYSER NO.:  [id number]  MEDICAL RECORD NO.:  [id number]  LOCATION:  57                         FACILITY:  [facility]  PHYSICIAN:  [name], MD      DATE OF BIRTH:  [birth year]  DATE OF PROCEDURE:  03/14/2016                              OPERATIVE REPORT   PREOPERATIVE DIAGNOSIS:  Fournier gangrene, perineum.  POSTOP DIAGNOSIS:  Fournier gangrene, perineum.  PROCEDURE:  Incision, drainage, debridement, and open packing of Fournier gangrene. (Perirectal and perineum).  SURGEON:  [name], MD.  ASSISTANT:  Reece Packer, MD, Dr. Sharmaine Base (resident).  ANESTHESIA:  General.  ESTIMATED BLOOD LOSS:  Minimal.  PREPARATION:  Betadine.  COMPLICATIONS:  None.  INDICATIONS:  The patient is a 65 year old white male, admitted urgently with sepsis and developing Fournier gangrene.  After medical stabilization and imaging, the patient was prepared urgently for the operating room for incision, drainage, and debridement.  BODY OF REPORT:  The patient was brought to the operating room by the urologic team.  The patient had been placed under general anesthesia, positioned and draped.  Urology had opened the scrotum and evacuated and debrided the scrotal abscess and Fournier gangrene extending posteriorly onto the perineum.  With the assistance of the Urology team, dissection was carried into the perirectal region.  Skin was incised bilaterally into the medial buttocks.  Perirectal tissues were opened and a large amount of foul-smelling necrotic debris was evacuated, greater on the right side than on the left.  Tissue was sharply debrided.  Hemostasis was achieved with the electrocautery.  Following debridement, the entire wound was irrigated with the pulse lavage.  Antibiotic solution was then applied throughout the wound.  The wound was then packed with Betadine-soaked 4 inch Kerlix gauze packing. This was covered with ABD pads and  secured with mesh dressings.  The patient was awakened from anesthesia, will be admitted to the intensive care unit for management.  The patient tolerated the procedure well.   [name], MD, [name] Office: [phone]    TMG/MEDQ  D:  03/14/2016  T:  03/15/2016  Job:  [id number]  cc:   Reece Packer, MD Fax: [phone]

## 2016-03-16 ENCOUNTER — Encounter (HOSPITAL_COMMUNITY): Payer: Self-pay | Admitting: Certified Registered"

## 2016-03-16 ENCOUNTER — Inpatient Hospital Stay (HOSPITAL_COMMUNITY): Payer: Medicare Other | Admitting: Certified Registered"

## 2016-03-16 ENCOUNTER — Encounter (HOSPITAL_COMMUNITY)
Admission: EM | Disposition: A | Discharge status: Skilled Nursing Facility | Payer: Self-pay | Source: Home / Self Care | Attending: Internal Medicine

## 2016-03-16 ENCOUNTER — Inpatient Hospital Stay (HOSPITAL_COMMUNITY): Payer: Medicare Other

## 2016-03-16 HISTORY — PX: INCISION AND DRAINAGE OF WOUND: SHX1803

## 2016-03-16 LAB — BLOOD GAS, ARTERIAL
ACID-BASE DEFICIT: 1.8 mmol/L (ref 0.0–2.0)
BICARBONATE: 27.4 mmol/L (ref 20.0–28.0)
Drawn by: 295031
O2 CONTENT: 15 L/min
O2 SAT: 98.6 %
PATIENT TEMPERATURE: 98.6
PCO2 ART: 72.7 mmHg — AB (ref 32.0–48.0)
PO2 ART: 191 mmHg — AB (ref 83.0–108.0)
pH, Arterial: 7.201 — ABNORMAL LOW (ref 7.350–7.450)

## 2016-03-16 LAB — GLUCOSE, CAPILLARY
GLUCOSE-CAPILLARY: 144 mg/dL — AB (ref 65–99)
GLUCOSE-CAPILLARY: 141 mg/dL — AB (ref 65–99)
GLUCOSE-CAPILLARY: 141 mg/dL — AB (ref 65–99)
Glucose-Capillary: 113 mg/dL — ABNORMAL HIGH (ref 65–99)

## 2016-03-16 LAB — BASIC METABOLIC PANEL
Anion gap: 7 (ref 5–15)
BUN: 11 mg/dL (ref 6–20)
CALCIUM: 7.4 mg/dL — AB (ref 8.9–10.3)
CHLORIDE: 104 mmol/L (ref 101–111)
CO2: 24 mmol/L (ref 22–32)
Creatinine, Ser: 0.67 mg/dL (ref 0.61–1.24)
GFR calc Af Amer: >60 mL/min (ref >60)
Glucose, Bld: 130 mg/dL — ABNORMAL HIGH (ref 65–99)
POTASSIUM: 3.7 mmol/L (ref 3.5–5.1)
SODIUM: 135 mmol/L (ref 135–145)

## 2016-03-16 LAB — CBC
HCT: 32 % — ABNORMAL LOW (ref 39.0–52.0)
HEMOGLOBIN: 10.9 g/dL — AB (ref 13.0–17.0)
MCH: 31.1 pg (ref 26.0–34.0)
MCHC: 34.1 g/dL (ref 30.0–36.0)
MCV: 91.4 fL (ref 78.0–100.0)
PLATELETS: 228 10*3/uL (ref 150–400)
RBC: 3.5 MIL/uL — AB (ref 4.22–5.81)
RDW: 14.1 % (ref 11.5–15.5)
WBC: 14.5 10*3/uL — ABNORMAL HIGH (ref 4.0–10.5)

## 2016-03-16 LAB — URINE CULTURE: CULTURE: NO GROWTH

## 2016-03-16 LAB — CORTISOL: CORTISOL PLASMA: 18.1 ug/dL

## 2016-03-16 LAB — VANCOMYCIN, TROUGH: VANCOMYCIN TR: 8 ug/mL — AB (ref 15–20)

## 2016-03-16 SURGERY — IRRIGATION AND DEBRIDEMENT WOUND
Anesthesia: General | Site: Perineum

## 2016-03-16 MED ORDER — DILTIAZEM HCL 100 MG IV SOLR
5.0000 mg/h | INTRAVENOUS | Status: DC
  Filled 2016-03-16: qty 100

## 2016-03-16 MED ORDER — PROPOFOL 10 MG/ML IV BOLUS
INTRAVENOUS | Status: DC | PRN
Start: 2016-03-16 — End: 2016-03-16
  Administered 2016-03-16: 200 mg via INTRAVENOUS

## 2016-03-16 MED ORDER — DILTIAZEM LOAD VIA INFUSION
10.0000 mg | Freq: Once | INTRAVENOUS | Status: AC
  Administered 2016-03-16: 10 mg via INTRAVENOUS
  Filled 2016-03-16: qty 10

## 2016-03-16 MED ORDER — HALOPERIDOL LACTATE 5 MG/ML IJ SOLN: 1.0000 mg | Freq: Four times a day (QID) | INTRAMUSCULAR | Status: DC | PRN

## 2016-03-16 MED ORDER — VANCOMYCIN HCL IN DEXTROSE 1-5 GM/200ML-% IV SOLN
1000.0000 mg | Freq: Three times a day (TID) | INTRAVENOUS | Status: DC
  Administered 2016-03-16 – 2016-03-20 (×11): 1000 mg via INTRAVENOUS
  Filled 2016-03-16 (×11): qty 200

## 2016-03-16 MED ORDER — SODIUM CHLORIDE 0.9 % IR SOLN
Status: DC | PRN
  Administered 2016-03-16: 3000 mL

## 2016-03-16 MED ORDER — FENTANYL CITRATE (PF) 100 MCG/2ML IJ SOLN
INTRAMUSCULAR | Status: DC | PRN
  Administered 2016-03-16: 50 ug via INTRAVENOUS
  Administered 2016-03-16 (×2): 25 ug via INTRAVENOUS

## 2016-03-16 MED ORDER — MEPERIDINE HCL 25 MG/ML IJ SOLN: 6.2500 mg | INTRAMUSCULAR | Status: DC | PRN

## 2016-03-16 MED ORDER — PROPOFOL 10 MG/ML IV BOLUS
INTRAVENOUS | Status: AC
  Filled 2016-03-16: qty 20

## 2016-03-16 MED ORDER — OXYCODONE HCL 5 MG/5ML PO SOLN: 5.0000 mg | Freq: Once | ORAL | Status: DC | PRN

## 2016-03-16 MED ORDER — LIDOCAINE 2% (20 MG/ML) 5 ML SYRINGE
INTRAMUSCULAR | Status: AC
  Filled 2016-03-16: qty 10

## 2016-03-16 MED ORDER — OXYCODONE HCL 5 MG PO TABS: 5.0000 mg | ORAL_TABLET | Freq: Once | ORAL | Status: DC | PRN

## 2016-03-16 MED ORDER — SODIUM CHLORIDE 0.9 % IV BOLUS (SEPSIS)
500.0000 mL | Freq: Once | INTRAVENOUS | Status: AC
  Administered 2016-03-16: 500 mL via INTRAVENOUS

## 2016-03-16 MED ORDER — ONDANSETRON HCL 4 MG/2ML IJ SOLN
INTRAMUSCULAR | Status: AC
  Filled 2016-03-16: qty 2

## 2016-03-16 MED ORDER — LABETALOL HCL 5 MG/ML IV SOLN
INTRAVENOUS | Status: AC
  Administered 2016-03-16: 5 mg
  Filled 2016-03-16: qty 4

## 2016-03-16 MED ORDER — DILTIAZEM HCL 100 MG IV SOLR: 5.0000 mg/h | INTRAVENOUS | Status: DC

## 2016-03-16 MED ORDER — DILTIAZEM LOAD VIA INFUSION: 10.0000 mg | Freq: Once | INTRAVENOUS | Status: DC

## 2016-03-16 MED ORDER — MIDAZOLAM HCL 2 MG/2ML IJ SOLN
INTRAMUSCULAR | Status: AC
  Filled 2016-03-16: qty 2

## 2016-03-16 MED ORDER — ONDANSETRON HCL 4 MG/2ML IJ SOLN
INTRAMUSCULAR | Status: DC | PRN
  Administered 2016-03-16: 4 mg via INTRAVENOUS

## 2016-03-16 MED ORDER — LACTATED RINGERS IV SOLN
INTRAVENOUS | Status: DC | PRN
  Administered 2016-03-16: 12:00:00 via INTRAVENOUS

## 2016-03-16 MED ORDER — FENTANYL CITRATE (PF) 100 MCG/2ML IJ SOLN
INTRAMUSCULAR | Status: AC
Start: 2016-03-16 — End: 2016-03-16
  Filled 2016-03-16: qty 2

## 2016-03-16 MED ORDER — SODIUM CHLORIDE 0.9 % IV SOLN
INTRAVENOUS | Status: AC
  Administered 2016-03-16: 08:00:00 via INTRAVENOUS

## 2016-03-16 MED ORDER — MIDAZOLAM HCL 5 MG/5ML IJ SOLN
INTRAMUSCULAR | Status: DC | PRN
  Administered 2016-03-16: 2 mg via INTRAVENOUS

## 2016-03-16 MED ORDER — LABETALOL HCL 5 MG/ML IV SOLN
2.5000 mg | Freq: Once | INTRAVENOUS | Status: AC
  Administered 2016-03-16: 2.5 mg via INTRAVENOUS

## 2016-03-16 MED ORDER — LIDOCAINE 2% (20 MG/ML) 5 ML SYRINGE
INTRAMUSCULAR | Status: AC
  Filled 2016-03-16: qty 5

## 2016-03-16 MED ORDER — HYDROMORPHONE HCL 1 MG/ML IJ SOLN: 0.2500 mg | INTRAMUSCULAR | Status: DC | PRN

## 2016-03-16 MED ORDER — LACTATED RINGERS IV SOLN: INTRAVENOUS | Status: DC

## 2016-03-16 MED ORDER — ONDANSETRON HCL 4 MG/2ML IJ SOLN: 4.0000 mg | Freq: Once | INTRAMUSCULAR | Status: DC | PRN

## 2016-03-16 MED ORDER — LIDOCAINE 2% (20 MG/ML) 5 ML SYRINGE
INTRAMUSCULAR | Status: DC | PRN
Start: 2016-03-16 — End: 2016-03-16
  Administered 2016-03-16 (×2): 100 mg via INTRAVENOUS

## 2016-03-16 MED ORDER — 0.9 % SODIUM CHLORIDE (POUR BTL) OPTIME
TOPICAL | Status: DC | PRN
  Administered 2016-03-16: 1000 mL

## 2016-03-16 SURGICAL SUPPLY — 39 items
APL SKNCLS STERI-STRIP NONHPOA (GAUZE/BANDAGES/DRESSINGS)
BENZOIN TINCTURE PRP APPL 2/3 (GAUZE/BANDAGES/DRESSINGS) IMPLANT
BLADE HEX COATED 2.75 (ELECTRODE) ×1 IMPLANT
BLADE SURG SZ10 CARB STEEL (BLADE) ×3 IMPLANT
BNDG GAUZE ELAST 4 BULKY (GAUZE/BANDAGES/DRESSINGS) ×12 IMPLANT
CHLORAPREP W/TINT 26ML (MISCELLANEOUS) ×1 IMPLANT
CLOSURE WOUND 1/2 X4 (GAUZE/BANDAGES/DRESSINGS)
COVER SURGICAL LIGHT HANDLE (MISCELLANEOUS) ×1 IMPLANT
DECANTER SPIKE VIAL GLASS SM (MISCELLANEOUS) IMPLANT
DRAIN CHANNEL RND F F (WOUND CARE) IMPLANT
DRAPE LAPAROTOMY T 102X78X121 (DRAPES) IMPLANT
DRAPE LAPAROTOMY TRNSV 102X78 (DRAPE) IMPLANT
DRAPE SHEET LG 3/4 BI-LAMINATE (DRAPES) IMPLANT
ELECT REM PT RETURN 9FT ADLT (ELECTROSURGICAL) ×3
ELECTRODE REM PT RTRN 9FT ADLT (ELECTROSURGICAL) ×1 IMPLANT
EVACUATOR SILICONE 100CC (DRAIN) IMPLANT
GAUZE SPONGE 4X4 12PLY STRL (GAUZE/BANDAGES/DRESSINGS) ×3 IMPLANT
GLOVE BIOGEL PI IND STRL 7.0 (GLOVE) ×1 IMPLANT
GLOVE BIOGEL PI INDICATOR 7.0 (GLOVE) ×2
GLOVE SURG ORTHO 8.0 STRL STRW (GLOVE) ×3 IMPLANT
GOWN STRL REUS W/TWL LRG LVL3 (GOWN DISPOSABLE) ×3 IMPLANT
GOWN STRL REUS W/TWL XL LVL3 (GOWN DISPOSABLE) ×6 IMPLANT
KIT BASIN OR (CUSTOM PROCEDURE TRAY) ×3 IMPLANT
MARKER SKIN DUAL TIP RULER LAB (MISCELLANEOUS) IMPLANT
NDL HYPO 25X1 1.5 SAFETY (NEEDLE) ×1 IMPLANT
NEEDLE HYPO 25X1 1.5 SAFETY (NEEDLE) IMPLANT
NS IRRIG 1000ML POUR BTL (IV SOLUTION) ×3 IMPLANT
PACK CYSTO (CUSTOM PROCEDURE TRAY) ×2 IMPLANT
PACK GENERAL/GYN (CUSTOM PROCEDURE TRAY) ×3 IMPLANT
PAD ABD 8X10 STRL (GAUZE/BANDAGES/DRESSINGS) ×2 IMPLANT
SOL PREP PROV IODINE SCRUB 4OZ (MISCELLANEOUS) ×2 IMPLANT
SPONGE LAP 18X18 X RAY DECT (DISPOSABLE) ×2 IMPLANT
STAPLER VISISTAT 35W (STAPLE) IMPLANT
STRIP CLOSURE SKIN 1/2X4 (GAUZE/BANDAGES/DRESSINGS) IMPLANT
SUT ETHILON 3 0 PS 1 (SUTURE) IMPLANT
SUT MNCRL AB 4-0 PS2 18 (SUTURE) IMPLANT
SUT VIC AB 3-0 SH 18 (SUTURE) IMPLANT
SYR CONTROL 10ML LL (SYRINGE) ×1 IMPLANT
TOWEL OR 17X26 10 PK STRL BLUE (TOWEL DISPOSABLE) ×3 IMPLANT

## 2016-03-16 NOTE — Anesthesia Postprocedure Evaluation (Signed)
Anesthesia Post Note  Patient: SHOGO SPEARIN  Procedure(s) Performed: Procedure(s) (LRB): IRRIGATION AND DEBRIDEMENT WOUND PERIANAL AND SCROTAL (N/A)  Patient location during evaluation: PACU Anesthesia Type: General Level of consciousness: awake and alert Pain management: pain level controlled Vital Signs Assessment: post-procedure vital signs reviewed and stable Respiratory status: spontaneous breathing, nonlabored ventilation and respiratory function stable Cardiovascular status: blood pressure returned to baseline and tachycardic Postop Assessment: no signs of nausea or vomiting Anesthetic complications: no Comments: Pt placed on BiPAP in PACU due to ABG result of C02 of 70 with pH of 7.20.  See progress notes.    Last Vitals:  Vitals:   03/16/16 1430 03/16/16 1440  BP: (!) 87/61 (!) 85/56  Pulse: 80 (!) 106  Resp: (!) 25 16  Temp:      Last Pain:  Vitals:   03/16/16 1302  TempSrc:   PainSc: 0-No pain                 Vernee Baines A

## 2016-03-16 NOTE — Progress Notes (Signed)
[name], male   DOB: 09/14/1950, 65 y.o.   MRN: [id number]  PROGRESS NOTE    Jonathan Tucker  OZD:[id number] DOB: [birth year] DOA: 03/14/2016  PCP: [name],[name], PA-C   Brief Narrative:  65 y.o. male with past medical history of type 2 diabetes mellitus (not on insulin), dyslipidemia, hypertension who presented to [facility] ED with perineal infection. He was seen by urology prior to the admission and was referred for admission due to concern for Fournier's gangrene.  In ED, BP was 62/29, HR 116, RR 30, T max 98.6 F and oxygen saturation 89-100%. Blood work was notable for WBC count 17, sodium 124, lactic acid 3.01. CT scan concerning for Fournier's gangrene.. Pt seen by surgery in consultation and underwent I&D of the perineal and scrotal abscess 9/29.  Assessment & Plan:   Principal Problem:   Sepsis secondary to Fournier's gangrene / Perineal and scrotal abscess (HCC) / Lactic acidosis / Leukocytosis  - Sepsis criteria met on admission with hypotension, tachycardia, tachypnea, leukocytosis an dlactic acidosis. 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 without long term insulin use - A1c on this admission 6.5, indicates good glycemic control - Continue SSI    Hyponatremia - Due to sepsis - Improved with IV fluids to 135    Acute renal failure (ARF) (HCC) - Due to sepsis - Cr normalized with IV fluids     Acute blood loss anemia - Hgb dropped from 13.9 to 10.5, possibly post procedural - Continue to monitor CBC daily     Dyslipidemia associated with type 2 DM - Continue Welchol, fenofibrate, Crestor      Essential hypertension - BP reasonably controlled   DVT prophylaxis: SCD's bilaterally  Code Status:  full code  Family Communication: wife at the bedside this am Disposition Plan: remains in SDU for next 24 hours due to sepsis    Consultants:   Surgery   Procedures:   I&D of the perineal and scrotal abscess 03/14/2016  Antimicrobials:   Vanco and zosyn 03/14/2016 -->    Subjective: No overnight events.   Objective: Vitals:   03/16/16 1303 03/16/16 1310 03/16/16 1315 03/16/16 1330  BP:  (!) 144/99 (!) 141/73 (!) 131/104  Pulse: 97 (!) 153 (!) 177 (!) 146  Resp: 14 (!) 23 (!) 21 13  Temp:  97.7 F (36.5 C)    TempSrc:      SpO2: 98% (!) 85% 100% 100%  Weight:      Height:        Intake/Output Summary (Last 24 hours) at 03/16/16 1343 Last data filed at 03/16/16 1306  Gross per 24 hour  Intake             4515 ml  Output             5386 ml  Net             -871 ml   Filed Weights   03/14/16 1014  Weight: 86.2 kg (190 lb)    Examination:  General exam: Appears calm and comfortable, no distress  Respiratory system: NO wheezing, no rhonchi  Cardiovascular system: S1 & S2 heard, Rate controlled  Gastrointestinal system: (+) BS, non tender  Central nervous system: No focal  neurological deficits. Extremities: Symmetric 5 x 5 power. No edema Skin: perineal and scrotal abscess  Psychiatry: Normal mood and behavior   Data Reviewed: I have personally reviewed following labs and imaging studies  CBC:  Recent Labs Lab 03/14/16 1034 03/15/16 0122 03/16/16 0317  WBC 17.0* 14.2* 14.5*  NEUTROABS 14.9* 12.4*  --   HGB 13.9 10.5* 10.9*  HCT 38.1* 30.5* 32.0*  MCV 86.0 90.0 91.4  PLT 169 181 735   Basic Metabolic Panel:  Recent Labs Lab 03/14/16 1034 03/15/16 0122 03/16/16 0317  NA 124* 127* 135  K 3.7 4.2 3.7  CL 88* 100* 104  CO2 24 21* 24  GLUCOSE 208* 167* 130*  BUN 27* 22* 11  CREATININE 1.34* 1.08 0.67  CALCIUM 8.5* 6.7* 7.4*  MG  --  1.6*  --   PHOS  --  2.9  --    GFR: Estimated Creatinine Clearance: 99.7 mL/min (by C-G formula based on  SCr of 0.67 mg/dL). Liver Function Tests:  Recent Labs Lab 03/14/16 1034 03/15/16 0122  AST 20 22  ALT 16* 10*  ALKPHOS 58 39  BILITOT 0.9 1.2  PROT 6.6 4.3*  ALBUMIN 2.8* 1.9*   No results for input(s): LIPASE, AMYLASE in the last 168 hours. No results for input(s): AMMONIA in the last 168 hours. Coagulation Profile: No results for input(s): INR, PROTIME in the last 168 hours. Cardiac Enzymes: No results for input(s): CKTOTAL, CKMB, CKMBINDEX, TROPONINI in the last 168 hours. BNP (last 3 results) No results for input(s): PROBNP in the last 8760 hours. HbA1C:  Recent Labs  03/14/16 1034  HGBA1C 6.5*   CBG:  Recent Labs Lab 03/15/16 1608 03/15/16 2125 03/16/16 0215 03/16/16 0819 03/16/16 1127  GLUCAP 185* 156* 144* 141* 141*   Lipid Profile: No results for input(s): CHOL, HDL, LDLCALC, TRIG, CHOLHDL, LDLDIRECT in the last 72 hours. Thyroid Function Tests: No results for input(s): TSH, T4TOTAL, FREET4, T3FREE, THYROIDAB in the last 72 hours. Anemia Panel: No results for input(s): VITAMINB12, FOLATE, FERRITIN, TIBC, IRON, RETICCTPCT in the last 72 hours. Urine analysis:    Component Value Date/Time   COLORURINE YELLOW 03/14/2016 2002   APPEARANCEUR CLEAR 03/14/2016 2002   LABSPEC 1.028 03/14/2016 2002   PHURINE 6.0 03/14/2016 2002   GLUCOSEU NEGATIVE 03/14/2016 2002   HGBUR TRACE (A) 03/14/2016 2002   BILIRUBINUR NEGATIVE 03/14/2016 2002   KETONESUR NEGATIVE 03/14/2016 2002   PROTEINUR 30 (A) 03/14/2016 2002   NITRITE NEGATIVE 03/14/2016 2002   LEUKOCYTESUR NEGATIVE 03/14/2016 2002   Sepsis Labs: _0 (procalcitonin:4,lacticidven:4)    Recent Results (from the past 240 hour(s))  Blood Culture (routine x 2)     Status: None (Preliminary result)   Collection Time: 03/14/16 10:34 AM  Result Value Ref Range Status   Specimen Description BLOOD LEFT ANTECUBITAL  Final   Special Requests BOTTLES DRAWN AEROBIC AND ANAEROBIC 5ML  Final   Culture    Final    NO GROWTH 1 DAY Performed at [facility]    Report Status PENDING  Incomplete  Blood Culture (routine x 2)     Status: None (Preliminary result)   Collection Time: 03/14/16 10:46 AM  Result Value Ref Range Status   Specimen Description BLOOD LEFT HAND  Final   Special Requests BOTTLES DRAWN AEROBIC AND ANAEROBIC 5ML  Final   Culture   Final    NO GROWTH 1 DAY Performed at [facility]    Report Status PENDING  Incomplete  Aerobic Culture (superficial  specimen)     Status: None (Preliminary result)   Collection Time: 03/14/16  3:01 PM  Result Value Ref Range Status   Specimen Description   Final    ABSCESS IRRIGATION AND DEBRIDEMENT SCROTAL ABCESS AND CYTOSCOPY   Special Requests NONE  Final   Gram Stain   Final    FEW WBC PRESENT, PREDOMINANTLY MONONUCLEAR ABUNDANT GRAM NEGATIVE RODS FEW GRAM POSITIVE COCCI IN PAIRS FEW GRAM POSITIVE RODS    Culture   Final    CULTURE REINCUBATED FOR BETTER GROWTH Performed at [facility]    Report Status PENDING  Incomplete  MRSA PCR Screening     Status: None   Collection Time: 03/14/16  5:32 PM  Result Value Ref Range Status   MRSA by PCR NEGATIVE NEGATIVE Final    Comment:        The GeneXpert MRSA Assay (FDA approved for NASAL specimens only), is one component of a comprehensive MRSA colonization surveillance program. It is not intended to diagnose MRSA infection nor to guide or monitor treatment for MRSA infections.   Urine culture     Status: None   Collection Time: 03/14/16  8:02 PM  Result Value Ref Range Status   Specimen Description URINE, CATHETERIZED  Final   Special Requests NONE  Final   Culture NO GROWTH Performed at [facility]   Final   Report Status 03/16/2016 FINAL  Final      Radiology Studies: Ct Abdomen Pelvis W Contrast Result Date: 03/14/2016  1. CT findings consistent with Fournier's gangrene, with extensive skin thickening, subcutaneous fat stranding and  subcutaneous emphysema throughout the perineum, bilateral medial buttocks, right ischiorectal fossa and scrotum. 2. Subcutaneous abscess in the right perineum. Large right hydrocele. No ascites or intraperitoneal gas or fluid collections. 3. Additional findings include aortic atherosclerosis, coronary atherosclerosis, centrilobular emphysema and diffuse hepatic steatosis. These results were called by telephone at the time of interpretation on 03/14/2016 at 12:29 pm to Dr. Addison Lank , who verbally acknowledged these results. Electronically Signed   By: [name] M.D.   On: 03/14/2016 12:32   Dg Chest Port 1 View Result Date: 03/14/2016  1. Mild right base infiltrate cannot be excluded. 2. Mild cardiomegaly.      Scheduled Meds: . [MAR Hold] aspirin EC  325 mg Oral QPM  . [MAR Hold] colesevelam  1,250 mg Oral BID WC  . [MAR Hold] fenofibrate  160 mg Oral Daily  . [MAR Hold] insulin aspart  0-9 Units Subcutaneous TID WC  . [MAR Hold] insulin aspart  3 Units Subcutaneous TID WC  . [MAR Hold] insulin glargine  8 Units Subcutaneous Daily  . [MAR Hold] ipratropium-albuterol  3 mL Nebulization TID  . [MAR Hold] piperacillin-tazobactam (ZOSYN)  IV  3.375 g Intravenous Q8H  . [MAR Hold] rosuvastatin  5 mg Oral QPM  . [MAR Hold] vancomycin  1,000 mg Intravenous Q8H   Continuous Infusions: . sodium chloride 100 mL/hr at 03/16/16 0733  . diltiazem (CARDIZEM) infusion       LOS: 2 days    Time spent: 15 minutes  Greater than 50% of the time spent on counseling and coordinating the care.   Leisa Lenz, MD [facility] Pager [phone]  If 7PM-7AM, please contact night-coverage [email or web address] Password [email or web address] 03/16/2016, 1:43 PM

## 2016-03-16 NOTE — Progress Notes (Signed)
Pt more alert. Refusing ABG at this time.

## 2016-03-16 NOTE — Progress Notes (Signed)
Notified by surgery pt in A fib, started on Cardizem drip, will return to SDU Monitor on Cardizem drip  Leisa Lenz [facility] [phone]

## 2016-03-16 NOTE — Anesthesia Preprocedure Evaluation (Signed)
Anesthesia Evaluation  Patient identified by MRN, date of birth, ID band Patient awake    Reviewed: Allergy & Precautions, NPO status , Patient's Chart, lab work & pertinent test results  Airway Mallampati: I  TM Distance: >3 FB Neck ROM: Full    Dental  (+) Teeth Intact, Poor Dentition, Edentulous Upper, Dental Advisory Given   Pulmonary COPD, Current Smoker,    breath sounds clear to auscultation       Cardiovascular  Rhythm:Regular Rate:Normal     Neuro/Psych    GI/Hepatic   Endo/Other  diabetes, Poorly Controlled, Type 2  Renal/GU      Musculoskeletal   Abdominal   Peds  Hematology   Anesthesia Other Findings   Reproductive/Obstetrics                             Anesthesia Physical Anesthesia Plan  ASA: III  Anesthesia Plan: General   Post-op Pain Management:    Induction: Intravenous  Airway Management Planned: LMA  Additional Equipment:   Intra-op Plan:   Post-operative Plan: Extubation in OR  Informed Consent: I have reviewed the patients History and Physical, chart, labs and discussed the procedure including the risks, benefits and alternatives for the proposed anesthesia with the patient or authorized representative who has indicated his/her understanding and acceptance.   Dental advisory given  Plan Discussed with: CRNA, Anesthesiologist and Surgeon  Anesthesia Plan Comments:         Anesthesia Quick Evaluation

## 2016-03-16 NOTE — Progress Notes (Signed)
Informed Elink "Dr. Lamonte Sakai" that pt was in afib.  This RN wanted clarification on to start Cardizem back or not.  See orders.  Irven Baltimore, RN

## 2016-03-16 NOTE — Progress Notes (Signed)
Pharmacy Antibiotic Follow-up Note  Jonathan Tucker is a 65 y.o. year-old male admitted on 03/14/2016.  Started on Vancomycin & Zosyn for perineal infection, r/o sepsis, r/o Fournier's gangrene.  10/1:  - Day #3 antibiotics - Afeb, WBC remains slightly elevated, SCr cont to improve, CrCl 100CG.  - Vanc trough = 8 mcg/ml on 750mg  q12h.  Assessment/Plan: - Increase Vanc to 1g q8h. Goal trough 15-60mcg/ml. - Cont Zosyn 3.375g IV Q8H infused over 4hrs. - Recheck Vanc trough at steady state. - Follow up renal fxn, culture results, and clinical course.   Temp (24hrs), Avg:98.1 F (36.7 C), Min:97.8 F (36.6 C), Max:98.9 F (37.2 C)   Recent Labs Lab 03/14/16 1034 03/15/16 0122 03/16/16 0317  WBC 17.0* 14.2* 14.5*     Recent Labs Lab 03/14/16 1034 03/15/16 0122 03/16/16 0317  CREATININE 1.34* 1.08 0.67   Estimated Creatinine Clearance: 99.7 mL/min (by C-G formula based on SCr of 0.67 mg/dL).    Allergies  Allergen Reactions  . Codeine     Does not tolerate, tolerates hydrocodone   . Flexeril [Cyclobenzaprine]     Burning/itching    Antimicrobials this admission:  9/29 Clinda once 9/29 Zosyn >>  9/29 Vanc >>  Dose adjustments this admission:  10/1: VT = 92mcg/ml on 750mg  q12h, increase to 1g q8h.  Microbiology results:  9/29 MRSA PCR: neg 9/29 BCx: ngtd 9/29 wound abscess: abundant GNR, few GPC, GPR  9/29 UCx: sent  Thank you for allowing pharmacy to be a part of this patient's care.  Romeo Rabon, PharmD, pager [phone]. 03/16/2016,11:26 AM.

## 2016-03-16 NOTE — Brief Op Note (Signed)
03/14/2016 - 03/16/2016  12:47 PM  PATIENT:  Jonathan Tucker  65 y.o. male  PRE-OPERATIVE DIAGNOSIS:  Fournier's gangrene  POST-OPERATIVE DIAGNOSIS:  same  PROCEDURE:  Procedure(s): IRRIGATION AND DEBRIDEMENT WOUND PERIANAL AND SCROTAL (N/A)  SURGEON:  Surgeon(s) and Role:    * Armandina Gemma, MD - Primary    * Bjorn Loser, MD - Assisting; Sharmaine Base, MD (resident)  ANESTHESIA:   general  EBL:  Total I/O In: [id number] [I.V.:345; IV Piggyback:450] Out: [id number] [Urine:1125; Blood:10]  BLOOD ADMINISTERED:none  DRAINS: none   LOCAL MEDICATIONS USED:  NONE  SPECIMEN:  No Specimen  DISPOSITION OF SPECIMEN:  N/A  COUNTS:  YES  TOURNIQUET:  * No tourniquets in log *  DICTATION: .Other Dictation: Dictation Number [id number]  PLAN OF CARE: Admit to inpatient   PATIENT DISPOSITION:  PACU - hemodynamically stable.   Delay start of Pharmacological VTE agent (>24hrs) due to surgical blood loss or risk of bleeding: yes  [name], MD, [name] Office: [phone]

## 2016-03-16 NOTE — Progress Notes (Signed)
In PACU, pt developed AFib with RVR to 160. ABG showed pH of 7.2 and C02 of 70 without wheezing.  BiPAP ordered and pt placed on Cardizem infusion.  Rate still around 130 so Labetalol given IV 7.5 mg to control rate to <110.    I felt he could be sent to his monitored bed and informed Dr. Charlies Silvers of his condition.  He will likely will need an arterial line placed and I recommended a cardiology consultation.

## 2016-03-16 NOTE — Anesthesia Procedure Notes (Addendum)
Procedure Name: LMA Insertion Date/Time: 03/16/2016 12:07 PM Performed by: Lajuana Carry E Pre-anesthesia Checklist: Patient identified, Emergency Drugs available, Suction available and Patient being monitored Patient Re-evaluated:Patient Re-evaluated prior to inductionOxygen Delivery Method: Circle system utilized Preoxygenation: Pre-oxygenation with 100% oxygen Intubation Type: IV induction Ventilation: Mask ventilation without difficulty LMA: LMA inserted LMA Size: 4.0 Number of attempts: 1 Tube secured with: Tape Dental Injury: Teeth and Oropharynx as per pre-operative assessment

## 2016-03-16 NOTE — Progress Notes (Signed)
Pt now hypoxic, more confused and combative. Will get CXR, ABG and will place order for 1 dose ativan 1 mg IV. Will consult PCCM as well.  Leisa Lenz [facility] [phone]

## 2016-03-16 NOTE — Consult Note (Signed)
Urology Consult Note   Requesting Attending Physician:  Robbie Lis, MD Service Providing Consult: Urology Consulting Attending: Matilde Sprang, MD  Assessment:  Patient is a 65 y.o. 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 S/p OR debridement of necrotic tissue 9/29 with Dr. Matilde Sprang and Dr. Harlow Asa.   Interval: Afebrile. HR to 110s. BP as low as 82/41, no pressors. 4.75L UOP via Foley (2.3 overnight). Cr 0.67 from 1.0. WBC 14. Zosyn, vanc; clinda d/c'ed. OR later this morning for 2nd look debridement. Urine culture pending. Culture from OR showing preliminary GNRs, GPCs, and GP rods. Having issues with hypotension related to narcotic pain meds.  Recommendations: 1. Continue broad spectrum antibiotics. Follow up OR cultures 2. Keep NPO for OR later this morning 3. OK to continue ASA from surgical perspective 4. Appreciate medicine's involvement in this patient's care, DM control/CAD, etc.  Thank you for this consult. Please contact the urology consult pager with any further questions/concerns. Sharmaine Base, MD Urology Surgical Resident  Subjective: Is ready to go back to [place], thankful for our help.   Objective   Vital signs in last 24 hours: BP 101/73   Pulse 85   Temp 97.8 F (36.6 C) (Oral)   Resp (!) 22   Ht 6' (1.829 m)   Wt 190 lb (86.2 kg)   SpO2 96%   BMI 25.77 kg/m   Intake/Output last 3 shifts: I/O last 3 completed shifts: In: 4609.3 [P.O.:720; I.V.:3439.3; IV Piggyback:450] Out: 6551 [id number]; Stool:1]  Physical Exam General: NAD, A&O HEENT: [place]/AT, EOMI, MMM Pulmonary: Normal work of breathing Cardiovascular: Regular rate & rhythm, HDS, adequate peripheral perfusion Abdomen: soft, NTTP, nondistended, no suprapubic fullness or tenderness GU: Foley draining yellow urine, scrotum/perineum not examined as we will re-evaluate in OR later this morning Extremities: warm and well perfused, no  edema   Most Recent Labs: Lab Results  Component Value Date   WBC 14.5 (H) 03/16/2016   HGB 10.9 (L) 03/16/2016   HCT 32.0 (L) 03/16/2016   PLT 228 03/16/2016    Lab Results  Component Value Date   NA 135 03/16/2016   K 3.7 03/16/2016   CL 104 03/16/2016   CO2 24 03/16/2016   BUN 11 03/16/2016   CREATININE 0.67 03/16/2016   CALCIUM 7.4 (L) 03/16/2016   MG 1.6 (L) 03/15/2016   PHOS 2.9 03/15/2016    Lab Results  Component Value Date   ALKPHOS 39 03/15/2016   BILITOT 1.2 03/15/2016   PROT 4.3 (L) 03/15/2016   ALBUMIN 1.9 (L) 03/15/2016   ALT 10 (L) 03/15/2016   AST 22 03/15/2016    No results found for: INR, APTT   Urine Culture: Pending   IMAGING: Ct Abdomen Pelvis W Contrast  Result Date: 03/14/2016 CLINICAL DATA:  65 year old male with diabetes mellitus and scrotal infection with fever and erythema spreading to the right buttock. Clinical concern for Fournier's gangrene. EXAM: CT ABDOMEN AND PELVIS WITH CONTRAST TECHNIQUE: Multidetector CT imaging of the abdomen and pelvis was performed using the standard protocol following bolus administration of intravenous contrast. CONTRAST:  124mL ISOVUE-300 IOPAMIDOL (ISOVUE-300) INJECTION 61% COMPARISON:  None. FINDINGS: Lower chest: No significant pulmonary nodules or acute consolidative airspace disease. Centrilobular emphysema at the lung bases. Coronary atherosclerosis. Hepatobiliary: Diffuse hepatic steatosis. No liver mass. Normal gallbladder with no radiopaque cholelithiasis. No biliary ductal dilatation. Pancreas: Normal, with no mass or duct dilation. Spleen: Normal size. No mass. Adrenals/Urinary Tract: Normal adrenals. No hydronephrosis. Hypodense 0.6  cm renal cortical lesion in the lateral interpolar left kidney, too small to characterize, for which no further follow-up is required. No additional renal lesions. Normal bladder. Stomach/Bowel: Grossly normal stomach. Normal caliber small bowel with no small bowel wall  thickening. Normal appendix. Normal large bowel with no diverticulosis, large bowel wall thickening or pericolonic fat stranding. Vascular/Lymphatic: Atherosclerotic nonaneurysmal abdominal aorta. Patent portal, splenic, hepatic and renal veins. Bilateral inguinal adenopathy measuring up to 2.1 cm on the right (series 2/ image 78) and 2.0 cm on the left (series 2/ image 77). No additional pathologically enlarged abdominopelvic nodes. Reproductive: Normal size prostate with nonspecific internal prostatic calcifications. Symmetric normal size seminal vesicles. Large right hydrocele measuring 7.5 x 6.4 cm (series 3/image 18). No left hydrocele. Severe thickening of the skin and subcutaneous soft tissues of the entire scrotum. Skin thickening, subcutaneous fat stranding and extensive subcutaneous emphysema throughout the perineum and bilateral medial buttocks, extending into the right ischial rectal fossa and deep right upper scrotal soft tissues. Subcutaneous abscess in the right perineum measuring 4.1 x 2.7 x 3.1 cm (series 2/image 99), demonstrating air-fluid level and thick enhancing wall. No additional focal drainable fluid collections in the abdomen or pelvis. Other: No pneumoperitoneum, ascites or focal fluid collection. Musculoskeletal: No aggressive appearing focal osseous lesions. Mild-to-moderate thoracolumbar spondylosis. IMPRESSION: 1. CT findings consistent with Fournier's gangrene, with extensive skin thickening, subcutaneous fat stranding and subcutaneous emphysema throughout the perineum, bilateral medial buttocks, right ischiorectal fossa and scrotum. 2. Subcutaneous abscess in the right perineum. Large right hydrocele. No ascites or intraperitoneal gas or fluid collections. 3. Additional findings include aortic atherosclerosis, coronary atherosclerosis, centrilobular emphysema and diffuse hepatic steatosis. These results were called by telephone at the time of interpretation on 03/14/2016 at 12:29 pm  to Dr. Addison Lank , who verbally acknowledged these results. Electronically Signed   By: [name] M.D.   On: 03/14/2016 12:32   Dg Chest Port 1 View  Result Date: 03/14/2016 CLINICAL DATA:  Sepsis.  Smoker. EXAM: PORTABLE CHEST 1 VIEW COMPARISON:  06/27/2010 . FINDINGS: Mediastinum and hilar structures normal. Mild cardiomegaly. Mild right base infiltrate cannot be excluded. No pleural effusion or pneumothorax . No acute bony abnormality. IMPRESSION: 1. Mild right base infiltrate cannot be excluded. 2. Mild cardiomegaly. Electronically Signed   By: Marcello Moores  Register   On: 03/14/2016 12:40

## 2016-03-16 NOTE — Progress Notes (Signed)
eLink Physician-Brief Progress Note Patient Name: Jonathan Tucker DOB: June 10, 1951 MRN: [id number]   Date of Service  03/16/2016  HPI/Events of Note  Patient evaluated 65 yo man, s/p I/D of scrotal abscess. He was disoriented, hypoxic post-extubation, developed A Fib + RVR. Was briefly on biPAP, was ordered dilt gtt. On my eval he is on [place], comfortable, HR irregular in 80's off the dilt.   eICU Interventions  Will follow closely from [place]. Hold off dilt gtt. Will defer diet to surgeons - suspect they will be ok to advance.      Intervention Category Evaluation Type: New Patient Evaluation  Louanna Vanliew S. 03/16/2016, 3:39 PM

## 2016-03-16 NOTE — Transfer of Care (Signed)
Immediate Anesthesia Transfer of Care Note  Patient: Jonathan Tucker  Procedure(s) Performed: Procedure(s): IRRIGATION AND DEBRIDEMENT WOUND PERIANAL AND SCROTAL (N/A)  Patient Location: PACU  Anesthesia Type:General  Level of Consciousness:  sedated, patient cooperative and responds to stimulation  Airway & Oxygen Therapy:Patient Spontanous Breathing and Patient connected to face mask oxgen  Post-op Assessment:  Report given to PACU RN and Post -op Vital signs reviewed and stable  Post vital signs:  Reviewed and stable  Last Vitals:  Vitals:   03/16/16 1100 03/16/16 1130  BP: 119/73 119/62  Pulse: 88 85  Resp: 17 17  Temp:  37 C    Complications: No apparent anesthesia complications

## 2016-03-16 NOTE — Op Note (Signed)
PreoPreoperative diagnosis: Fournier's gangrene with scrotal and perineal and perirectal abscess Postoperative diagnosis: Fournier's gangrene with scrotal and perineal and perirectal abscess Surgery: Excision and debridement of Fournier's gangrene and scrotal abscess  Surgeon: Dr. Nicki Reaper Emanuella Nickle Assistant Dr. Chuck Hint  The patient has the above diagnoses. He was being right back to the operating room for a second look procedure and debridement  The patient has the above diagnoses and consented the above procedure. It was a team approach. Patient was prepped and draped in usual fashion. IV antibiotics have been started. Extra care was taken with leg positioning.   After prepping the patient the packs were removed after saline was applied. There were multiple smaller areas of superficial necrotic tissue some of the scrotal level but primarily in the perirectal area and perineal area. In regards to the scrotal area they were sharply excised and cutting current was also utilized. Hydrocele on the right side was noted. His penile edema had less than. The scrotal skin and perineal skin was softening. There was no scrotal necrotic skin noted. The urethra was normal. The Foley catheter was in place.  Cautery was utilized and there was virtually almost no bleeding. The Pulsavac was utilized again in both areas. The same sterile dressing with Hibiclens and Kerlix was utilized. ABG pads and mesh pants were utilized.  The plan is to bring the patient back in 2 days. Final debridement will be performed if needed. We may close the deep tissue some to speed up healing processes. I may or may not aspirate or make a small window to drain his hydrocele depend upon anticipated scrotal skin healing.   Postoperatively the nurses had me reassess the urethral tubing and it was almost like there may have been sudsy fluid. Clinically he has never had any communication with the genitourinary system and his initial  cystoscopy was normal. The catheter was draining very well and I looked at the urine assessed her in detail. For this reason I will re-cystoscoped him and put in a new catheter on Tuesday as part of the next procedure

## 2016-03-16 NOTE — Progress Notes (Signed)
At 1815 pt converted to sinus rhythm.  Irven Baltimore, RN

## 2016-03-16 NOTE — Progress Notes (Signed)
Pt alert x4, BP is  70/ 40's, made MD Kim aware, orders received at this time, will continue to monitor and report off to on coming RN.

## 2016-03-17 ENCOUNTER — Inpatient Hospital Stay (HOSPITAL_COMMUNITY): Payer: Medicare Other

## 2016-03-17 ENCOUNTER — Encounter (HOSPITAL_COMMUNITY): Payer: Self-pay | Admitting: Urology

## 2016-03-17 DIAGNOSIS — I4891 Unspecified atrial fibrillation: Secondary | ICD-10-CM | POA: Diagnosis not present

## 2016-03-17 DIAGNOSIS — I10 Essential (primary) hypertension: Secondary | ICD-10-CM | POA: Diagnosis present

## 2016-03-17 DIAGNOSIS — N501 Vascular disorders of male genital organs: Secondary | ICD-10-CM

## 2016-03-17 DIAGNOSIS — E785 Hyperlipidemia, unspecified: Secondary | ICD-10-CM | POA: Diagnosis present

## 2016-03-17 DIAGNOSIS — N289 Disorder of kidney and ureter, unspecified: Secondary | ICD-10-CM

## 2016-03-17 LAB — GLUCOSE, CAPILLARY
Glucose-Capillary: 158 mg/dL — ABNORMAL HIGH (ref 65–99)
Glucose-Capillary: 138 mg/dL — ABNORMAL HIGH (ref 65–99)
Glucose-Capillary: 164 mg/dL — ABNORMAL HIGH (ref 65–99)
Glucose-Capillary: 106 mg/dL — ABNORMAL HIGH (ref 65–99)

## 2016-03-17 LAB — CBC
HEMATOCRIT: 31.7 % — AB (ref 39.0–52.0)
Hemoglobin: 10.6 g/dL — ABNORMAL LOW (ref 13.0–17.0)
MCH: 30.9 pg (ref 26.0–34.0)
MCHC: 33.4 g/dL (ref 30.0–36.0)
MCV: 92.4 fL (ref 78.0–100.0)
Platelets: 300 10*3/uL (ref 150–400)
RBC: 3.43 MIL/uL — AB (ref 4.22–5.81)
RDW: 14.4 % (ref 11.5–15.5)
WBC: 12.2 10*3/uL — AB (ref 4.0–10.5)

## 2016-03-17 LAB — AEROBIC CULTURE W GRAM STAIN (SUPERFICIAL SPECIMEN): Culture: NORMAL

## 2016-03-17 LAB — FUNGAL STAIN REFLEX

## 2016-03-17 LAB — AEROBIC CULTURE  (SUPERFICIAL SPECIMEN)

## 2016-03-17 LAB — FUNGUS STAIN

## 2016-03-17 MED ORDER — MAGNESIUM OXIDE 400 (241.3 MG) MG PO TABS
400.0000 mg | ORAL_TABLET | Freq: Two times a day (BID) | ORAL | Status: AC
  Administered 2016-03-17 – 2016-03-19 (×5): 400 mg via ORAL
  Filled 2016-03-17 (×5): qty 1

## 2016-03-17 MED ORDER — POTASSIUM CHLORIDE CRYS ER 20 MEQ PO TBCR
40.0000 meq | EXTENDED_RELEASE_TABLET | Freq: Once | ORAL | Status: AC
  Administered 2016-03-17: 40 meq via ORAL
  Filled 2016-03-17: qty 2

## 2016-03-17 MED ORDER — METOPROLOL TARTRATE 25 MG PO TABS
12.5000 mg | ORAL_TABLET | Freq: Two times a day (BID) | ORAL | Status: DC
  Administered 2016-03-17 – 2016-03-20 (×7): 12.5 mg via ORAL
  Filled 2016-03-17 (×8): qty 1

## 2016-03-17 NOTE — Progress Notes (Signed)
Date:  March 17, 2016 Chart reviewed for concurrent status and case management needs. Will continue to follow the patient for status change: Discharge Planning: following for needs Expected discharge date: [id number] Velva Harman, BSN, [place], [place]

## 2016-03-17 NOTE — Progress Notes (Signed)
Patient noted to have BP 84/33 (45).  MD notified.  549mL NS bolus ordered. Will continue to monitor.

## 2016-03-17 NOTE — Consult Note (Signed)
Reason for Consult:   PAF with RVR-new onset  Requesting Physician: [facility] Primary Cardiologist New  HPI:   65 y/o overweight Caucasian male with a history of DM, COPD-2ppd smoker, HTN, and dyslipidemia, admitted 03/14/16 with sepsis and Fournier's gangrene. He underwent I&D 03/14/16 and debridement 03/16/16. After his debridement 10/1 he developed respiratory failure requiring BiPap and went into rapid AF. He has since stabilized. He is on nasal O2 and comfortable now, also in NSR. He will require debridement again on 10/3. Cardiology has been asked to evaluate and follow.  The pt denies any history of MI, CAD, or PAF. He did have a stress test and an echo years ago- "referred by a heart doctor in [place]". He was told these test were OK- no results in Epic. He has chronic DOE which he attributes to heavy smoking but he denies chest pain. 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 He does say his prior cardiac work up was secondary to "irregular heart beats" which he says he has had "all my life".   PMHx:  Past Medical History:  Diagnosis Date  . Diabetes mellitus without complication [facility])     Past Surgical History:  Procedure Laterality Date  . INCISION AND DRAINAGE ABSCESS N/A 03/14/2016   Procedure: IRRIGATION AND DRAINAGE ABSCESS OF PERINEAL AND SCROTUM, General Deep and Superficial areas;  Surgeon: Armandina Gemma, MD;  Location: [facility];  Service: General;  Laterality: N/A;  . IRRIGATION AND DEBRIDEMENT ABSCESS N/A 03/14/2016   Procedure: IRRIGATION AND DEBRIDEMENT OF PERINEAL AND SCROTAL ABSCESS, AND CYSTOSCOPY;  Surgeon: Bjorn Loser, MD;  Location: [facility];  Service: Urology;  Laterality: N/A;    SOCHx:  reports that he has been smoking.  He has never used smokeless tobacco. He reports that he does not drink alcohol. His drug history is not on file.  FAMHx: Family History  Problem Relation Age of Onset  . Cancer Mother   .  Cancer Father     ALLERGIES: Allergies  Allergen Reactions  . Codeine     Does not tolerate, tolerates hydrocodone   . Flexeril [Cyclobenzaprine]     Burning/itching    ROS: Review of Systems: General: negative for chills, fever, night sweats or weight changes.  Cardiovascular: negative for chest pain, edema, orthopnea, no palpitations or sustained tachycardia, paroxysmal nocturnal dyspnea  HEENT: negative for any visual disturbances, blindness, glaucoma Dermatological: negative for rash Respiratory: negative for cough, hemoptysis, or wheezing Urologic: See HPI Abdominal: negative for nausea, vomiting, diarrhea, bright red blood per rectum, melena, or hematemesis Neurologic: negative for visual changes, syncope, or dizziness Musculoskeletal: negative for back pain, joint pain, or swelling Psych: cooperative and appropriate All other systems reviewed and are otherwise negative except as noted above.   HOME MEDICATIONS: Prior to Admission medications   Medication Sig Start Date End Date Taking? Authorizing Provider  aspirin EC 325 MG tablet Take 325 mg by mouth every evening.   Yes Historical Provider, MD  calcium carbonate (TUMS - DOSED IN MG ELEMENTAL CALCIUM) 500 MG chewable tablet Chew 1 tablet by mouth daily as needed for indigestion or heartburn.   Yes Historical Provider, MD  colesevelam (WELCHOL) 625 MG tablet Take 1,250 mg by mouth 2 (two) times daily with a meal.   Yes Historical Provider, MD  fenofibrate (TRICOR) 145 MG tablet Take 145 mg by mouth every evening.   Yes Historical Provider, MD  hydrochlorothiazide (  HYDRODIURIL) 25 MG tablet Take 25 mg by mouth daily.   Yes Historical Provider, MD  naproxen sodium (ANAPROX) 220 MG tablet Take 440 mg by mouth daily as needed (pain).   Yes Historical Provider, MD  ramipril (ALTACE) 10 MG capsule Take 10 mg by mouth every evening.   Yes Historical Provider, MD  rosuvastatin (CRESTOR) 5 MG tablet Take 5 mg by mouth every  evening.   Yes Historical Provider, MD    HOSPITAL MEDICATIONS: I have reviewed the patient's current medications.  VITALS: Blood pressure 134/85, pulse 84, temperature 98.1 F (36.7 C), temperature source Oral, resp. rate 18, height 6' (1.829 m), weight 190 lb (86.2 kg), SpO2 95 %.  PHYSICAL EXAM: General appearance: alert, cooperative, no distress and mildly obese Neck: no carotid bruit and no JVD Lungs: decreased breath sounds, no wheezing Heart: regular rate and rhythm Abdomen: obese, non tender Extremities: extremities normal, atraumatic, no cyanosis or edema Pulses: 2+ and symmetric Skin: Skin color, texture, turgor normal. No rashes or lesions Neurologic: Grossly normal  LABS: Results for orders placed or performed during the hospital encounter of 03/14/16 (from the past 24 hour(s))  Glucose, capillary     Status: Abnormal   Collection Time: 03/16/16 11:27 AM  Result Value Ref Range   Glucose-Capillary 141 (H) 65 - 99 mg/dL   Comment 1 Notify RN    Comment 2 Document in Chart   Blood gas, arterial     Status: Abnormal   Collection Time: 03/16/16  2:00 PM  Result Value Ref Range   O2 Content 15.0 L/min   Delivery systems SIMPLE MASK    pH, Arterial 7.201 (L) 7.350 - 7.450   pCO2 arterial 72.7 (HH) 32.0 - 48.0 mmHg   pO2, Arterial 191 (H) 83.0 - 108.0 mmHg   Bicarbonate 27.4 20.0 - 28.0 mmol/L   Acid-base deficit 1.8 0.0 - 2.0 mmol/L   O2 Saturation 98.6 %   Patient temperature 98.6    Collection site RIGHT RADIAL    Drawn by [phone]    Sample type ARTERIAL DRAW    Allens test (pass/fail) PASS PASS  Glucose, capillary     Status: Abnormal   Collection Time: 03/16/16 11:10 PM  Result Value Ref Range   Glucose-Capillary 113 (H) 65 - 99 mg/dL  CBC     Status: Abnormal   Collection Time: 03/17/16  3:06 AM  Result Value Ref Range   WBC 12.2 (H) 4.0 - 10.5 K/uL   RBC 3.43 (L) 4.22 - 5.81 MIL/uL   Hemoglobin 10.6 (L) 13.0 - 17.0 g/dL   HCT 31.7 (L) 39.0 - 52.0 %    MCV 92.4 78.0 - 100.0 fL   MCH 30.9 26.0 - 34.0 pg   MCHC 33.4 30.0 - 36.0 g/dL   RDW 14.4 11.5 - 15.5 %   Platelets 300 150 - 400 K/uL  Glucose, capillary     Status: Abnormal   Collection Time: 03/17/16  8:04 AM  Result Value Ref Range   Glucose-Capillary 158 (H) 65 - 99 mg/dL   Comment 1 Notify RN    Comment 2 Document in Chart     EKG: !0/1/17- AF with VR 140, incomplete RBBB  IMAGING: Dg Chest Port 1 View 03/16/16   IMPRESSION: Cardiomegaly and pulmonary venous congestion without definitive superimposed acute cardiopulmonary disease on these degraded AP portable examinations. Further evaluation with a PA and lateral chest radiographs may be obtained as clinically indicated. Electronically Signed   By: Eldridge Abrahams.D.  On: 03/16/2016 14:57   Abd CT 03/14/16 IMPRESSION: 1. CT findings consistent with Fournier's gangrene, with extensive skin thickening, subcutaneous fat stranding and subcutaneous emphysema throughout the perineum, bilateral medial buttocks, right ischiorectal fossa and scrotum. 2. Subcutaneous abscess in the right perineum. Large right hydrocele. No ascites or intraperitoneal gas or fluid collections. 3. Additional findings include aortic atherosclerosis, coronary atherosclerosis, centrilobular emphysema and diffuse hepatic steatosis  IMPRESSION: Active Problems:   Fournier's gangrene in male   Atrial fibrillation with RVR (HCC)   Sepsis ([place]   Diabetes mellitus ([place])   COPD    Current every day smoker   Dyslipidemia  RECOMMENDATION: Pt is currently in NSR. CHADs VASc=4 for HTN, age, DM, and vascular disease (see CT). He is currently on ASA 325 mg- will review with MD. He apparently will need more surgery. Check echo. Document TSH. Add Lopressor 12.5 mg BID (he is currently off Diltiazem-resume if he has recurrent PAF), continue statin. Keep K+ close to 4.0 and Mg++ 1.8. MD to see.   Time Spent Directly with Patient: 31 minutes  Kerin Ransom, [place] beeper 03/17/2016, 11:18 AM   Personally seen and examined. Agree with above.  62 with Fournier's gangrene, COPD, TOB, new onset PAF.  PAF (atrial fib)  - now in NSR  - no anticoagulation for now  - Continue with addition of metoprolol as described above  - If AFIB returns, will need to discuss anticoagulation. Discussed with he and family stroke risk with AFIB. Obviously, if he is going to surgery tomorrow, no anticoag.   - I took care of his wife previously (reassuring workup she states)  Now RRR, CTAB, laying on side.  Candee Furbish, MD

## 2016-03-17 NOTE — Progress Notes (Addendum)
[name], male   DOB: 06/27/1950, 65 y.o.   MRN: [id number]  PROGRESS NOTE    MENDELL BONTEMPO  VXY:[id number] DOB: 04-21-1951 DOA: 03/14/2016  PCP: [name],[name], PA-C   Brief Narrative:  65 y.o. male with past medical history of type 2 diabetes mellitus (not on insulin), dyslipidemia, hypertension who presented to [facility] ED with perineal infection. He was seen by urology prior to the admission and was referred for admission due to concern for Fournier's gangrene.  In ED, BP was 62/29, HR 116, RR 30, T max 98.6 F and oxygen saturation 89-100%. Blood work was notable for WBC count 17, sodium 124, lactic acid 3.01. CT scan concerning for Fournier's gangrene.. Pt seen by surgery in consultation and underwent I&D of the perineal and scrotal abscess 9/29.  Assessment & Plan:   Principal Problem:   Sepsis secondary to Fournier's gangrene / Perineal and scrotal abscess (HCC) / Lactic acidosis / Leukocytosis  - Sepsis criteria met on admission with hypotension, tachycardia, tachypnea, leukocytosis an dlactic acidosis. Source of infection perineal/ scrotal abscess  - Continue vanco and zosyn - Underwent I&D of the perineal and scrotal abscess 9/29, 10/1 - Plan for repeat I&D in am - Continue supportive care with fluids, analgesia as needed  Active Problems:   Acute respiratory failure with hypoxia / acute pulmonary vascular congestion - On 10/1 more hypoxic after procedure  - Now stable - CXR yesterday 10/1 showed cardiomegaly and pulm vasc congestion - Appreciate cardiology input  - Patient declined 2-D echo at least for today, he prefers to rest and have echo tomorrow    New onset A fib - Appreciate cardiology input - In sinus rhythm on Cardizem drip    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 without long term insulin use - A1c on this admission 6.5, indicates good glycemic control - CBG's in past 24 hours: 141, 113, 158 - Continue SSI     Hyponatremia - Due to sepsis - Improved with IV fluids to 135    Acute renal failure (ARF) (HCC) - Due to sepsis - Cr normalized with IV fluids     Acute blood loss anemia - Hgb dropped from 13.9 to 10.5, possibly post procedural - Hgb stable at 10.6    Dyslipidemia associated with type 2 DM - Continue Welchol, fenofibrate, Crestor      Essential hypertension - BP reasonably controlled    DVT prophylaxis: SCD's bilaterally  Code Status: full code  Family Communication: wife at the bedside this am Disposition Plan: remains in SDU due to sepsis    Consultants:   Surgery   Cardiology   Procedures:   I&D of the perineal and scrotal abscess 03/14/2016, 03/16/2016  Antimicrobials:   Vanco and zosyn 03/14/2016 -->    Subjective: No overnight events.   Objective: Vitals:   03/17/16 0400 03/17/16 0500 03/17/16 0600 03/17/16 0750  BP: (!) 128/44 127/68 124/65 134/85  Pulse: 88 93 88 84  Resp: 20 20 16 18   Temp: 98.4 F (36.9 C)   98.1 F (36.7 C)  TempSrc: Oral   Oral  SpO2: 97% 94% 95% 95%  Weight:      Height:        Intake/Output Summary (Last 24 hours) at 03/17/16 1224 Last data filed at 03/17/16 0600  Gross per 24 hour  Intake              900 ml  Output  1460 ml  Net             -560 ml   Filed Weights   03/14/16 1014  Weight: 86.2 kg (190 lb)    Examination:  General exam: no distress  Respiratory system: No wheezing, no rhonchi  Cardiovascular system: S1 & S2 heard, RRR Gastrointestinal system: (+) BS, non tender, Abd pad (+) Central nervous system: Nonfocal  Extremities: No edema, palpable pulses  Skin: perineal and scrotal abscess  Psychiatry: Normal mood and behavior, no agitations, no restlessness   Data Reviewed: I have personally reviewed following labs and imaging studies  CBC:  Recent Labs Lab 03/14/16 1034 03/15/16 0122 03/16/16 0317 03/17/16 0306  WBC 17.0* 14.2* 14.5* 12.2*  NEUTROABS 14.9* 12.4*  --   --    HGB 13.9 10.5* 10.9* 10.6*  HCT 38.1* 30.5* 32.0* 31.7*  MCV 86.0 90.0 91.4 92.4  PLT 169 181 228 161   Basic Metabolic Panel:  Recent Labs Lab 03/14/16 1034 03/15/16 0122 03/16/16 0317  NA 124* 127* 135  K 3.7 4.2 3.7  CL 88* 100* 104  CO2 24 21* 24  GLUCOSE 208* 167* 130*  BUN 27* 22* 11  CREATININE 1.34* 1.08 0.67  CALCIUM 8.5* 6.7* 7.4*  MG  --  1.6*  --   PHOS  --  2.9  --    GFR: Estimated Creatinine Clearance: 99.7 mL/min (by C-G formula based on SCr of 0.67 mg/dL). Liver Function Tests:  Recent Labs Lab 03/14/16 1034 03/15/16 0122  AST 20 22  ALT 16* 10*  ALKPHOS 58 39  BILITOT 0.9 1.2  PROT 6.6 4.3*  ALBUMIN 2.8* 1.9*   No results for input(s): LIPASE, AMYLASE in the last 168 hours. No results for input(s): AMMONIA in the last 168 hours. Coagulation Profile: No results for input(s): INR, PROTIME in the last 168 hours. Cardiac Enzymes: No results for input(s): CKTOTAL, CKMB, CKMBINDEX, TROPONINI in the last 168 hours. BNP (last 3 results) No results for input(s): PROBNP in the last 8760 hours. HbA1C: No results for input(s): HGBA1C in the last 72 hours. CBG:  Recent Labs Lab 03/16/16 0215 03/16/16 0819 03/16/16 1127 03/16/16 2310 03/17/16 0804  GLUCAP 144* 141* 141* 113* 158*   Lipid Profile: No results for input(s): CHOL, HDL, LDLCALC, TRIG, CHOLHDL, LDLDIRECT in the last 72 hours. Thyroid Function Tests: No results for input(s): TSH, T4TOTAL, FREET4, T3FREE, THYROIDAB in the last 72 hours. Anemia Panel: No results for input(s): VITAMINB12, FOLATE, FERRITIN, TIBC, IRON, RETICCTPCT in the last 72 hours. Urine analysis:    Component Value Date/Time   COLORURINE YELLOW 03/14/2016 2002   APPEARANCEUR CLEAR 03/14/2016 2002   LABSPEC 1.028 03/14/2016 2002   PHURINE 6.0 03/14/2016 2002   GLUCOSEU NEGATIVE 03/14/2016 2002   HGBUR TRACE (A) 03/14/2016 2002   BILIRUBINUR NEGATIVE 03/14/2016 2002   KETONESUR NEGATIVE 03/14/2016 2002    PROTEINUR 30 (A) 03/14/2016 2002   NITRITE NEGATIVE 03/14/2016 2002   LEUKOCYTESUR NEGATIVE 03/14/2016 2002   Sepsis Labs: @LABRCNTIP (procalcitonin:4,lacticidven:4)    Recent Results (from the past 240 hour(s))  Blood Culture (routine x 2)     Status: None (Preliminary result)   Collection Time: 03/14/16 10:34 AM  Result Value Ref Range Status   Specimen Description BLOOD LEFT ANTECUBITAL  Final   Special Requests BOTTLES DRAWN AEROBIC AND ANAEROBIC 5ML  Final   Culture   Final    NO GROWTH 2 DAYS Performed at [facility]    Report Status PENDING  Incomplete  Blood Culture (routine x 2)     Status: None (Preliminary result)   Collection Time: 03/14/16 10:46 AM  Result Value Ref Range Status   Specimen Description BLOOD LEFT HAND  Final   Special Requests BOTTLES DRAWN AEROBIC AND ANAEROBIC 5ML  Final   Culture   Final    NO GROWTH 2 DAYS Performed at [facility]    Report Status PENDING  Incomplete  Anaerobic culture     Status: None (Preliminary result)   Collection Time: 03/14/16  3:01 PM  Result Value Ref Range Status   Specimen Description   Final    ABSCESS IRRIGATION AND DEBRIDEMENT SCROTAL ABCESS AND CYTOSCOPY   Special Requests NONE  Final   Culture   Final    HOLDING FOR POSSIBLE ANAEROBE Performed at [facility]    Report Status PENDING  Incomplete  Aerobic Culture (superficial specimen)     Status: None   Collection Time: 03/14/16  3:01 PM  Result Value Ref Range Status   Specimen Description   Final    ABSCESS IRRIGATION AND DEBRIDEMENT SCROTAL ABCESS AND CYTOSCOPY   Special Requests NONE  Final   Gram Stain   Final    FEW WBC PRESENT, PREDOMINANTLY MONONUCLEAR ABUNDANT GRAM NEGATIVE RODS FEW GRAM POSITIVE COCCI IN PAIRS FEW GRAM POSITIVE RODS    Culture   Final    NORMAL SKIN FLORA Performed at [facility]    Report Status 03/17/2016 FINAL  Final  Fungus Stain     Status: None   Collection Time: 03/14/16  3:01 PM   Result Value Ref Range Status   FUNGUS STAIN Final report  Final    Comment: (NOTE) Performed At: [facility] [address] [place], [place] [id number] Lindon Romp MD IW:[id number]    Fungal Source ABSCESS  Final    Comment: IRRIGATION AND DEBRIDEMENT SCROTAL ABCESS AND CYTOSCOPY  Fungal Stain reflex     Status: None   Collection Time: 03/14/16  3:01 PM  Result Value Ref Range Status   Fungal stain result 1 Comment  Final    Comment: (NOTE) KOH/Calcofluor preparation:  no fungus observed. Performed At: [facility] [place], [place] [id number] Lindon Romp MD AL:[id number]   MRSA PCR Screening     Status: None   Collection Time: 03/14/16  5:32 PM  Result Value Ref Range Status   MRSA by PCR NEGATIVE NEGATIVE Final  Urine culture     Status: None   Collection Time: 03/14/16  8:02 PM  Result Value Ref Range Status   Specimen Description URINE, CATHETERIZED  Final   Special Requests NONE  Final   Culture NO GROWTH Performed at [facility]   Final   Report Status 03/16/2016 FINAL  Final      Radiology Studies: Ct Abdomen Pelvis W Contrast Result Date: 03/14/2016  1. CT findings consistent with Fournier's gangrene, with extensive skin thickening, subcutaneous fat stranding and subcutaneous emphysema throughout the perineum, bilateral medial buttocks, right ischiorectal fossa and scrotum. 2. Subcutaneous abscess in the right perineum. Large right hydrocele. No ascites or intraperitoneal gas or fluid collections. 3. Additional findings include aortic atherosclerosis, coronary atherosclerosis, centrilobular emphysema and diffuse hepatic steatosis. These results were called by telephone at the time of interpretation on 03/14/2016 at 12:29 pm to Dr. Addison Lank , who verbally acknowledged these results. Electronically Signed   By: [name] M.D.   On: 03/14/2016 12:32   Dg  Chest Port 1 View Result Date: 03/14/2016  1. Mild right  base infiltrate cannot be excluded. 2. Mild cardiomegaly.   Dg Chest Port 1 View Result Date: 03/16/2016 Cardiomegaly and pulmonary venous congestion without definitive superimposed acute cardiopulmonary disease on these degraded AP portable examinations. Further evaluation with a PA and lateral chest radiographs may be obtained as clinically indicated.     Scheduled Meds: . aspirin EC  325 mg Oral QPM  . colesevelam  1,250 mg Oral BID WC  . fenofibrate  160 mg Oral Daily  . insulin aspart  0-9 Units Subcutaneous TID WC  . insulin aspart  3 Units Subcutaneous TID WC  . insulin glargine  8 Units Subcutaneous Daily  . ipratropium-albuterol  3 mL Nebulization TID  . magnesium oxide  400 mg Oral BID  . metoprolol tartrate  12.5 mg Oral BID  . piperacillin-tazobactam (ZOSYN)  IV  3.375 g Intravenous Q8H  . rosuvastatin  5 mg Oral QPM  . vancomycin  1,000 mg Intravenous Q8H   Continuous Infusions:     LOS: 3 days    Time spent: 25 minutes  Greater than 50% of the time spent on counseling and coordinating the care.   Leisa Lenz, MD [facility] Pager [phone]  If 7PM-7AM, please contact night-coverage [email or web address] Password [email or web address] 03/17/2016, 12:24 PM

## 2016-03-17 NOTE — Consult Note (Signed)
Urology Consult Note   Requesting Attending Physician:  Robbie Lis, MD Service Providing Consult: Urology Consulting Attending: Matilde Sprang, MD  Assessment:  Patient is a 65 y.o. 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 S/p OR debridement of necrotic tissue 9/29 with Dr. Matilde Sprang and Dr. Harlow Asa, again 10/1.   Interval: S/p 2nd look debridement yesterday. Wound bed looked relatively healthy, minor necrotic tissue amount removed (largely from deep right perirectal pocket). Packed with betadine soaked gauze again. A fib postop with RVR to 160s, BP 85/56. Started on dilt gtt. Hypercarbia with combative behavior, started on BIPAP. Stable HR overnight in 80-90s, normotensive now. Less confused. WBC 12. Stable Hb. Adequate UOP via Foley. Still on vanc/zosyn.   Recommendations: 1. Continue broad spectrum antibiotics. Follow up OR cultures 2. Can change ABD pads overlying betadine soaked gauze PRN. Do not remove betadine soaked gauze 3. OK to continue ASA from surgical perspective 4. Appreciate medicine's involvement in this patient's care, DM control/CAD, etc. 5. Plan for OR look again 10/3 for possible debridement and repeat packing if appropriate from medical/anesthetic perspective in light of A fib/respiratory issues  Thank you for this consult. Please contact the urology consult pager with any further questions/concerns. Sharmaine Base, MD Urology Surgical Resident  Subjective: Feels like he was combative overnight, but unsure why. Some confusion too that he admits. Now feels better, knows place, year.   Objective   Vital signs in last 24 hours: BP 134/85   Pulse 84   Temp 98.4 F (36.9 C) (Oral)   Resp 18   Ht 6' (1.829 m)   Wt 190 lb (86.2 kg)   SpO2 95%   BMI 25.77 kg/m   Intake/Output last 3 shifts: I/O last 3 completed shifts: In: 1695 [I.V.:745; IV Piggyback:950] Out: 4886 [Urine:4875; Stool:1;  Blood:10]  Physical Exam General: NAD, A&O HEENT: [place]/AT, EOMI, MMM, [place] in place Pulmonary: Normal work of breathing Cardiovascular: Regular rate & rhythm, HDS, adequate peripheral perfusion Abdomen: soft, NTTP, nondistended, no suprapubic fullness or tenderness GU: Foley draining yellow urine, mesh underwear in place with no significant soak through, overlying gauze in wound bed appears dry with no soak through, unable to fully assess perirectal and perineal aspect of wound/gauze Extremities: warm and well perfused, no edema   Most Recent Labs: Lab Results  Component Value Date   WBC 12.2 (H) 03/17/2016   HGB 10.6 (L) 03/17/2016   HCT 31.7 (L) 03/17/2016   PLT 300 03/17/2016    Lab Results  Component Value Date   NA 135 03/16/2016   K 3.7 03/16/2016   CL 104 03/16/2016   CO2 24 03/16/2016   BUN 11 03/16/2016   CREATININE 0.67 03/16/2016   CALCIUM 7.4 (L) 03/16/2016   MG 1.6 (L) 03/15/2016   PHOS 2.9 03/15/2016    Lab Results  Component Value Date   ALKPHOS 39 03/15/2016   BILITOT 1.2 03/15/2016   PROT 4.3 (L) 03/15/2016   ALBUMIN 1.9 (L) 03/15/2016   ALT 10 (L) 03/15/2016   AST 22 03/15/2016    No results found for: INR, APTT   Urine Culture: No growth   IMAGING: Dg Chest Port 1 View  Result Date: 03/16/2016 CLINICAL DATA:  Post I&D today now with confusion, hypoxia and hypotension. EXAM: PORTABLE CHEST 1 VIEW COMPARISON:  03/12/2016; 06/27/2010 FINDINGS: Examination is degraded secondary to exclusion of the left costophrenic angle. Grossly unchanged enlarged cardiac silhouette and mediastinal contours given reduced lung volumes. Atherosclerotic  plaque within the thoracic aorta. Pulmonary venous congestion without frank evidence of edema. Worsening bilateral infrahilar opacities favored to represent atelectasis. No discrete focal airspace opacities. No definite pleural effusion, though note, the left costophrenic angle is excluded from view. No pneumothorax. No  acute osseus abnormalities. IMPRESSION: Cardiomegaly and pulmonary venous congestion without definitive superimposed acute cardiopulmonary disease on these degraded AP portable examinations. Further evaluation with a PA and lateral chest radiographs may be obtained as clinically indicated. Electronically Signed   By: Sandi Mariscal M.D.   On: 03/16/2016 14:57

## 2016-03-17 NOTE — Op Note (Signed)
NAMENIKASH, BENSE NO.:  [id number]  MEDICAL RECORD NO.:  [id number]  LOCATION:  [id number]                         FACILITY:  [facility]  PHYSICIAN:  [name], MD      DATE OF BIRTH:  [birth year]  DATE OF PROCEDURE:  03/16/2016                              OPERATIVE REPORT   PREOPERATIVE DIAGNOSIS:  Fournier's gangrene.  POSTOPERATIVE DIAGNOSIS:  same  PROCEDURE:  Examination under anesthesia, dressing change, limited debridement of perineum.  SURGEON:  [name], MD.  ASSISTANTNicki Reaper A. Matilde Sprang, MD and Sharmaine Base, MD (resident).  ANESTHESIA:  General per Dr. Lorrene Reid.  ESTIMATED BLOOD LOSS:  Minimal.  PREPARATION:  Betadine.  COMPLICATIONS:  None.  INDICATIONS:  The patient is a 65 year old male who underwent surgical debridement and open packing of Fournier gangrene on March 14, 2016. The patient now returns to the operating room for evaluation under anesthesia, further debridement, and dressing change.  BODY OF REPORT:  Procedure was done in OR #1 at the [facility].  The patient was brought to the operating room, placed in supine position on the operating room table.  Following administration of general anesthesia, the patient was positioned in lithotomy.  The patient was then prepped and draped in usual aseptic fashion.  Previous packing was removed from the scrotum, perineum, and perirectal regions.  There are some drainage and some odor.  There are few small areas of persistent necrosis.  These were debrided sharply. There was a dark area of skin anterior into the right of the anal orifice which is debrided and hemostasis achieved with electrocautery. Wounds were then copiously irrigated with pulse lavage.  All tissue throughout the wound appears viable.  Wound was then packed with Betadine-soaked Kerlix gauze.  ABDs and mesh pants are applied as dressing.  The patient was taken out  of lithotomy.  Debridement of the scrotum and testicles will be dictated under a separate operative report by Dr. Matilde Sprang.   [name], MD, [name] Office: [phone]    TMG/MEDQ  D:  03/16/2016  T:  03/17/2016  Job:  [id number]  cc:   Reece Packer, MD Fax: [phone]

## 2016-03-17 NOTE — Progress Notes (Signed)
General Surgery [name]  03/17/2016  Assessment & Plan: Fournier's gangrene  Patient having trouble sleeping.   Pain controlled  Cont IV abx  OK to allow oral diet today.  NPO p MN  Plan return to OR in AM with urology for dressing change    Jonathan Adie, MD  Colorectal and General Surgery [facility]     Subjective: Patient alert and oriented, wife at bedside.  Less pain.  Not sleeping well  Objective: Vital signs in last 24 hours: Temp:  [97.7 F (36.5 C)-99.9 F (37.7 C)] 98.1 F (36.7 C) (10/02 0750) Pulse Rate:  [54-177] 84 (10/02 0750) Resp:  [13-25] 18 (10/02 0750) BP: (84-154)/(33-133) 134/85 (10/02 0750) SpO2:  [82 %-100 %] 95 % (10/02 0750) FiO2 (%):  [100 %] 100 % (10/01 1420) Last BM Date:  (PTA)  Intake/Output from previous day: 10/01 0701 - 10/02 0700 In: 1695 [I.V.:745; IV Piggyback:950] Out: 2585 [Urine:2575; Blood:10] Intake/Output this shift: No intake/output data recorded.  Physical Exam: HEENT - sclerae clear, mucous membranes moist GU - mesh holding dry ABD pads in place Neuro - alert & oriented, no focal deficits  Lab Results:   Recent Labs  03/16/16 0317 03/17/16 0306  WBC 14.5* 12.2*  HGB 10.9* 10.6*  HCT 32.0* 31.7*  PLT 228 300   BMET  Recent Labs  03/15/16 0122 03/16/16 0317  NA 127* 135  K 4.2 3.7  CL 100* 104  CO2 21* 24  GLUCOSE 167* 130*  BUN 22* 11  CREATININE 1.08 0.67  CALCIUM 6.7* 7.4*   PT/INR No results for input(s): LABPROT, INR in the last 72 hours. Comprehensive Metabolic Panel:    Component Value Date/Time   NA 135 03/16/2016 0317   NA 127 (L) 03/15/2016 0122   K 3.7 03/16/2016 0317   K 4.2 03/15/2016 0122   CL 104 03/16/2016 0317   CL 100 (L) 03/15/2016 0122   CO2 24 03/16/2016 0317   CO2 21 (L) 03/15/2016 0122   BUN 11 03/16/2016 0317   BUN 22 (H) 03/15/2016 0122   CREATININE 0.67 03/16/2016 0317   CREATININE 1.08 03/15/2016 0122   GLUCOSE 130  (H) 03/16/2016 0317   GLUCOSE 167 (H) 03/15/2016 0122   CALCIUM 7.4 (L) 03/16/2016 0317   CALCIUM 6.7 (L) 03/15/2016 0122   AST 22 03/15/2016 0122   AST 20 03/14/2016 1034   ALT 10 (L) 03/15/2016 0122   ALT 16 (L) 03/14/2016 1034   ALKPHOS 39 03/15/2016 0122   ALKPHOS 58 03/14/2016 1034   BILITOT 1.2 03/15/2016 0122   BILITOT 0.9 03/14/2016 1034   PROT 4.3 (L) 03/15/2016 0122   PROT 6.6 03/14/2016 1034   ALBUMIN 1.9 (L) 03/15/2016 0122   ALBUMIN 2.8 (L) 03/14/2016 1034    Studies/Results: Dg Chest Port 1 View  Result Date: 03/16/2016 CLINICAL DATA:  Post I&D today now with confusion, hypoxia and hypotension. EXAM: PORTABLE CHEST 1 VIEW COMPARISON:  03/12/2016; 06/27/2010 FINDINGS: Examination is degraded secondary to exclusion of the left costophrenic angle. Grossly unchanged enlarged cardiac silhouette and mediastinal contours given reduced lung volumes. Atherosclerotic plaque within the thoracic aorta. Pulmonary venous congestion without frank evidence of edema. Worsening bilateral infrahilar opacities favored to represent atelectasis. No discrete focal airspace opacities. No definite pleural effusion, though note, the left costophrenic angle is excluded from view. No pneumothorax. No acute osseus abnormalities. IMPRESSION: Cardiomegaly and pulmonary venous congestion without definitive superimposed acute cardiopulmonary disease on these degraded AP portable  examinations. Further evaluation with a PA and lateral chest radiographs may be obtained as clinically indicated. Electronically Signed   By: Sandi Mariscal M.D.   On: 03/16/2016 [id number]      Jonathan Tucker [phone]  [name], male   DOB: Nov 25, 1950, 65 y.o.   MRN: [id number]

## 2016-03-18 ENCOUNTER — Encounter (HOSPITAL_COMMUNITY)
Admission: EM | Disposition: A | Discharge status: Skilled Nursing Facility | Payer: Self-pay | Source: Home / Self Care | Attending: Internal Medicine

## 2016-03-18 ENCOUNTER — Inpatient Hospital Stay (HOSPITAL_COMMUNITY): Payer: Medicare Other | Admitting: Certified Registered Nurse Anesthetist

## 2016-03-18 ENCOUNTER — Inpatient Hospital Stay (HOSPITAL_COMMUNITY): Payer: Medicare Other

## 2016-03-18 ENCOUNTER — Encounter (HOSPITAL_COMMUNITY): Payer: Self-pay | Admitting: Anesthesiology

## 2016-03-18 DIAGNOSIS — I4891 Unspecified atrial fibrillation: Secondary | ICD-10-CM

## 2016-03-18 HISTORY — PX: IRRIGATION AND DEBRIDEMENT ABSCESS: SHX5252

## 2016-03-18 LAB — ECHOCARDIOGRAM COMPLETE
EERAT: 15.2
EWDT: 187 ms
FS: 32 % (ref 28–44)
HEIGHTINCHES: 72 in
IVS/LV PW RATIO, ED: 0.96
LA ID, A-P, ES: 47 mm
LA diam end sys: 47 mm
LA diam index: 2.26 cm/m2
LA vol A4C: 73 ml
LA vol index: 36.3 mL/m2
LA vol: 75.5 mL
LV TDI E'MEDIAL: 8.27
LV e' LATERAL: 8.16 cm/s
LVEEAVG: 15.2
LVEEMED: 15.2
MV Dec: 187
MV pk E vel: 124 m/s
MVPG: 6 mmHg
MVPKAVEL: 116 m/s
PW: 14.1 mm — AB (ref 0.6–1.1)
RV TAPSE: 24.2 mm
TDI e' lateral: 8.16
WEIGHTICAEL: 3040 [oz_av]

## 2016-03-18 LAB — CBC
HCT: 38.3 % — ABNORMAL LOW (ref 39.0–52.0)
Hemoglobin: 12.4 g/dL — ABNORMAL LOW (ref 13.0–17.0)
MCH: 30.5 pg (ref 26.0–34.0)
MCHC: 32.4 g/dL (ref 30.0–36.0)
MCV: 94.3 fL (ref 78.0–100.0)
PLATELETS: 377 10*3/uL (ref 150–400)
RBC: 4.06 MIL/uL — AB (ref 4.22–5.81)
RDW: 14.4 % (ref 11.5–15.5)
WBC: 12 10*3/uL — AB (ref 4.0–10.5)

## 2016-03-18 LAB — BASIC METABOLIC PANEL
ANION GAP: 5 (ref 5–15)
ANION GAP: 6 (ref 5–15)
BUN: 6 mg/dL (ref 6–20)
BUN: 6 mg/dL (ref 6–20)
CALCIUM: 8.1 mg/dL — AB (ref 8.9–10.3)
CHLORIDE: 101 mmol/L (ref 101–111)
CO2: 31 mmol/L (ref 22–32)
CO2: 32 mmol/L (ref 22–32)
Calcium: 8.4 mg/dL — ABNORMAL LOW (ref 8.9–10.3)
Chloride: 101 mmol/L (ref 101–111)
Creatinine, Ser: 0.74 mg/dL (ref 0.61–1.24)
Creatinine, Ser: 0.62 mg/dL (ref 0.61–1.24)
GFR calc Af Amer: >60 mL/min (ref >60)
GFR calc non Af Amer: >60 mL/min (ref >60)
GLUCOSE: 153 mg/dL — AB (ref 65–99)
GLUCOSE: 145 mg/dL — AB (ref 65–99)
POTASSIUM: 3.9 mmol/L (ref 3.5–5.1)
POTASSIUM: 4.2 mmol/L (ref 3.5–5.1)
SODIUM: 139 mmol/L (ref 135–145)
Sodium: 137 mmol/L (ref 135–145)

## 2016-03-18 LAB — GLUCOSE, CAPILLARY
GLUCOSE-CAPILLARY: 127 mg/dL — AB (ref 65–99)
GLUCOSE-CAPILLARY: 142 mg/dL — AB (ref 65–99)
Glucose-Capillary: 126 mg/dL — ABNORMAL HIGH (ref 65–99)
Glucose-Capillary: 114 mg/dL — ABNORMAL HIGH (ref 65–99)

## 2016-03-18 LAB — VANCOMYCIN, TROUGH: VANCOMYCIN TR: 14 ug/mL — AB (ref 15–20)

## 2016-03-18 LAB — TSH: TSH: 2.964 u[IU]/mL (ref 0.350–4.500)

## 2016-03-18 SURGERY — IRRIGATION AND DEBRIDEMENT ABSCESS
Anesthesia: General | Site: Perineum

## 2016-03-18 MED ORDER — LACTATED RINGERS IV SOLN
INTRAVENOUS | Status: DC | PRN
  Administered 2016-03-18: 14:00:00 via INTRAVENOUS

## 2016-03-18 MED ORDER — HYDROMORPHONE HCL 1 MG/ML IJ SOLN
INTRAMUSCULAR | Status: AC
  Filled 2016-03-18: qty 1

## 2016-03-18 MED ORDER — HYDROMORPHONE HCL 1 MG/ML IJ SOLN
0.2500 mg | INTRAMUSCULAR | Status: DC | PRN
  Administered 2016-03-18 (×2): 0.5 mg via INTRAVENOUS

## 2016-03-18 MED ORDER — LIDOCAINE 2% (20 MG/ML) 5 ML SYRINGE
INTRAMUSCULAR | Status: DC | PRN
  Administered 2016-03-18: 60 mg via INTRAVENOUS

## 2016-03-18 MED ORDER — LACTATED RINGERS IV SOLN
INTRAVENOUS | Status: DC
  Administered 2016-03-18: 1000 mL via INTRAVENOUS
  Administered 2016-03-18: 17:00:00 via INTRAVENOUS

## 2016-03-18 MED ORDER — DEXAMETHASONE SODIUM PHOSPHATE 10 MG/ML IJ SOLN
INTRAMUSCULAR | Status: AC
  Filled 2016-03-18: qty 1

## 2016-03-18 MED ORDER — PROPOFOL 10 MG/ML IV BOLUS
INTRAVENOUS | Status: AC
Start: 2016-03-18 — End: 2016-03-18
  Filled 2016-03-18: qty 20

## 2016-03-18 MED ORDER — ONDANSETRON HCL 4 MG/2ML IJ SOLN
INTRAMUSCULAR | Status: DC | PRN
  Administered 2016-03-18: 4 mg via INTRAVENOUS

## 2016-03-18 MED ORDER — ONDANSETRON HCL 4 MG/2ML IJ SOLN
INTRAMUSCULAR | Status: AC
  Filled 2016-03-18: qty 2

## 2016-03-18 MED ORDER — PROMETHAZINE HCL 25 MG/ML IJ SOLN: 6.2500 mg | INTRAMUSCULAR | Status: DC | PRN

## 2016-03-18 MED ORDER — 0.9 % SODIUM CHLORIDE (POUR BTL) OPTIME
TOPICAL | Status: DC | PRN
  Administered 2016-03-18: 1000 mL

## 2016-03-18 MED ORDER — PROPOFOL 10 MG/ML IV BOLUS
INTRAVENOUS | Status: DC | PRN
  Administered 2016-03-18: 160 mg via INTRAVENOUS

## 2016-03-18 MED ORDER — MIDAZOLAM HCL 2 MG/2ML IJ SOLN
INTRAMUSCULAR | Status: AC
  Filled 2016-03-18: qty 2

## 2016-03-18 MED ORDER — FENTANYL CITRATE (PF) 250 MCG/5ML IJ SOLN
INTRAMUSCULAR | Status: AC
  Filled 2016-03-18: qty 5

## 2016-03-18 MED ORDER — FENTANYL CITRATE (PF) 100 MCG/2ML IJ SOLN
INTRAMUSCULAR | Status: DC | PRN
  Administered 2016-03-18: 50 ug via INTRAVENOUS
  Administered 2016-03-18 (×2): 25 ug via INTRAVENOUS
  Administered 2016-03-18 (×3): 50 ug via INTRAVENOUS

## 2016-03-18 SURGICAL SUPPLY — 35 items
ADH SKN CLS APL DERMABOND .7 (GAUZE/BANDAGES/DRESSINGS)
BLADE SURG SZ10 CARB STEEL (BLADE) ×3 IMPLANT
BNDG GAUZE ELAST 4 BULKY (GAUZE/BANDAGES/DRESSINGS) ×4 IMPLANT
COVER SURGICAL LIGHT HANDLE (MISCELLANEOUS) ×1 IMPLANT
DECANTER SPIKE VIAL GLASS SM (MISCELLANEOUS) IMPLANT
DERMABOND ADVANCED (GAUZE/BANDAGES/DRESSINGS)
DERMABOND ADVANCED .7 DNX12 (GAUZE/BANDAGES/DRESSINGS) IMPLANT
DRAPE LAPAROTOMY TRNSV 102X78 (DRAPE) IMPLANT
DRSG PAD ABDOMINAL 8X10 ST (GAUZE/BANDAGES/DRESSINGS) ×4 IMPLANT
ELECT PENCIL ROCKER SW 15FT (MISCELLANEOUS) ×3 IMPLANT
ELECT REM PT RETURN 9FT ADLT (ELECTROSURGICAL) ×3
ELECTRODE REM PT RTRN 9FT ADLT (ELECTROSURGICAL) ×1 IMPLANT
GAUZE SPONGE 4X4 12PLY STRL (GAUZE/BANDAGES/DRESSINGS) ×3 IMPLANT
GLOVE BIOGEL PI IND STRL 7.0 (GLOVE) ×1 IMPLANT
GLOVE BIOGEL PI INDICATOR 7.0 (GLOVE) ×2
GOWN STRL REUS W/TWL 2XL LVL3 (GOWN DISPOSABLE) ×3 IMPLANT
GOWN STRL REUS W/TWL XL LVL3 (GOWN DISPOSABLE) ×3 IMPLANT
KIT BASIN OR (CUSTOM PROCEDURE TRAY) ×3 IMPLANT
NDL SAFETY ECLIPSE 18X1.5 (NEEDLE) IMPLANT
NEEDLE HYPO 18GX1.5 SHARP (NEEDLE) ×3
NEEDLE HYPO 22GX1.5 SAFETY (NEEDLE) ×1 IMPLANT
PACK BASIC VI WITH GOWN DISP (CUSTOM PROCEDURE TRAY) ×3 IMPLANT
SPONGE LAP 18X18 X RAY DECT (DISPOSABLE) ×5 IMPLANT
STAPLER VISISTAT 35W (STAPLE) IMPLANT
SUT VIC AB 2-0 SH 27 (SUTURE) ×9
SUT VIC AB 2-0 SH 27X BRD (SUTURE) IMPLANT
SUT VIC AB 3-0 SH 18 (SUTURE) IMPLANT
SUT VIC AB 3-0 SH 27 (SUTURE) ×3
SUT VIC AB 3-0 SH 27X BRD (SUTURE) IMPLANT
SUT VIC AB 4-0 PS2 18 (SUTURE) IMPLANT
SUT VICRYL 0 UR6 27IN ABS (SUTURE) ×4 IMPLANT
SYR 20CC LL (SYRINGE) ×5 IMPLANT
TOWEL OR 17X26 10 PK STRL BLUE (TOWEL DISPOSABLE) ×3 IMPLANT
TOWEL OR NON WOVEN STRL DISP B (DISPOSABLE) ×3 IMPLANT
YANKAUER SUCT BULB TIP 10FT TU (MISCELLANEOUS) ×3 IMPLANT

## 2016-03-18 NOTE — Progress Notes (Signed)
[name], male   DOB: 1950/10/27, 65 y.o.   MRN: [id number]  PROGRESS NOTE    Jonathan Tucker  [id number]:[id number] DOB: August 22, 1950 DOA: 03/14/2016  PCP: [name],[name], PA-C   Brief Narrative:  65 y.o. male with past medical history of type 2 diabetes mellitus (not on insulin), dyslipidemia, hypertension who presented to [facility] ED with perineal infection. He was seen by urology prior to the admission and was referred for admission due to concern for Fournier's gangrene.   In ED, BP was 62/29, HR 116, RR 30, T max 98.6 F and oxygen saturation 89-100%. Blood work was notable for WBC count 17, sodium 124, lactic acid 3.01. CT scan concerning for Fournier's gangrene.. Pt seen by surgery in consultation and underwent I&D of the perineal and scrotal abscess 9/29, 10/1 and plan is for another one today 10/3.   Hospital course was complicated with new onset atrial fibrillation after patient underwent incision and drainage 03/16/2016. He was on brief course of Cardizem drip and spontaneously converted to normal sinus rhythm. He was seen by cardiology in consultation.  Assessment & Plan:   Principal Problem:   Sepsis secondary to Fournier's gangrene / Perineal and scrotal abscess (HCC) / Lactic acidosis / Leukocytosis  - Sepsis criteria met on admission with hypotension, tachycardia, tachypnea, leukocytosis an dlactic acidosis. Source of infection perineal/ scrotal abscess  - Continue vanco and zosyn - Underwent I&D of the perineal and scrotal abscess 9/29, 10/1 and another procedure is planned for today  - Blood cultures negative so far - Cx from I&D 9/29 showed no growth  - Continue supportive care with analgesia as needed  Active Problems:   Acute respiratory failure with hypoxia / acute pulmonary vascular congestion - On 10/1 more hypoxic after I&D - respiratory status is stable - CXR yesterday 10/1 showed cardiomegaly and pulm vasc congestion - 2 D ECHO will be done today     New onset A  fib - CHADS vasc score 1 - Appreciate cardiology input - Patient was on brief course of Cardizem drip but spontaneously converted to normal sinus rhythm - Per oncology, no need for anticoagulation for now but if he goes into A. 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 without long term insulin use - A1c on this admission 6.5, indicates good glycemic control - CBG's in past 24 hours: 164, 106, 127 - Continue SSI    Hyponatremia - Due to sepsis - Improved with IV fluids to 135, 137    Acute renal failure (ARF) (HCC) - Due to sepsis - Cr normalized with IV fluids     Acute blood loss anemia - Hgb dropped from 13.9 to 10.5, possibly post procedural - Hgb stable at 10.6    Dyslipidemia associated with type 2 DM - Continue Welchol, fenofibrate, Crestor      Essential hypertension - Continue metoprolol    DVT prophylaxis: SCD's bilaterally  Code Status: full code  Family Communication: wife at the bedside this am Disposition Plan: remains in SDU due to sepsis    Consultants:   Surgery   Cardiology   Procedures:   I&D of the perineal and scrotal abscess 03/14/2016, 03/16/2016  Antimicrobials:   Vanco and zosyn 03/14/2016 -->    Subjective: No overnight events.   Objective: Vitals:   03/18/16 0741 03/18/16  0800 03/18/16 0900 03/18/16 1000  BP:  140/70  (!) 144/85  Pulse:  74 75 74  Resp:  18 20 16   Temp: 98.3 F (36.8 C)     TempSrc: Oral     SpO2: 99% 92% 91% 98%  Weight:      Height:        Intake/Output Summary (Last 24 hours) at 03/18/16 1023 Last data filed at 03/18/16 1000  Gross per 24 hour  Intake             1030 ml  Output             5850 ml  Net            -4820 ml   Filed Weights   03/14/16 1014  Weight: 86.2 kg (190 lb)    Examination:  General exam:  no distress, calm and comfortable Respiratory system: No wheezing, no rhonchi  Cardiovascular system: S1 & S2 heard, rate controlled  Gastrointestinal system: (+) BS, non tender, Abd pad (+) Central nervous system: No focal deficits  Extremities: No edema, palpable pulses bilaterally  Skin: perineal and scrotal abscess  Psychiatry: Normal mood and behavior  Data Reviewed: I have personally reviewed following labs and imaging studies  CBC:  Recent Labs Lab 03/14/16 1034 03/15/16 0122 03/16/16 0317 03/17/16 0306  WBC 17.0* 14.2* 14.5* 12.2*  NEUTROABS 14.9* 12.4*  --   --   HGB 13.9 10.5* 10.9* 10.6*  HCT 38.1* 30.5* 32.0* 31.7*  MCV 86.0 90.0 91.4 92.4  PLT 169 181 228 421   Basic Metabolic Panel:  Recent Labs Lab 03/14/16 1034 03/15/16 0122 03/16/16 0317 03/18/16 0314  NA 124* 127* 135 137  K 3.7 4.2 3.7 3.9  CL 88* 100* 104 101  CO2 24 21* 24 31  GLUCOSE 208* 167* 130* 153*  BUN 27* 22* 11 6  CREATININE 1.34* 1.08 0.67 0.74  CALCIUM 8.5* 6.7* 7.4* 8.1*  MG  --  1.6*  --   --   PHOS  --  2.9  --   --    GFR: Estimated Creatinine Clearance: 99.7 mL/min (by C-G formula based on SCr of 0.74 mg/dL). Liver Function Tests:  Recent Labs Lab 03/14/16 1034 03/15/16 0122  AST 20 22  ALT 16* 10*  ALKPHOS 58 39  BILITOT 0.9 1.2  PROT 6.6 4.3*  ALBUMIN 2.8* 1.9*   No results for input(s): LIPASE, AMYLASE in the last 168 hours. No results for input(s): AMMONIA in the last 168 hours. Coagulation Profile: No results for input(s): INR, PROTIME in the last 168 hours. Cardiac Enzymes: No results for input(s): CKTOTAL, CKMB, CKMBINDEX, TROPONINI in the last 168 hours. BNP (last 3 results) No results for input(s): PROBNP in the last 8760 hours. HbA1C: No results for input(s): HGBA1C in the last 72 hours. CBG:  Recent Labs Lab 03/17/16 0804 03/17/16 1216 03/17/16 1717 03/17/16 2109 03/18/16 0827  GLUCAP 158* 138* 164* 106* 127*   Lipid Profile: No results  for input(s): CHOL, HDL, LDLCALC, TRIG, CHOLHDL, LDLDIRECT in the last 72 hours. Thyroid Function Tests:  Recent Labs  03/18/16 0314  TSH 2.964   Anemia Panel: No results for input(s): VITAMINB12, FOLATE, FERRITIN, TIBC, IRON, RETICCTPCT in the last 72 hours. Urine analysis:    Component Value Date/Time   COLORURINE YELLOW 03/14/2016 2002   APPEARANCEUR CLEAR 03/14/2016 2002   LABSPEC 1.028 03/14/2016 2002   PHURINE 6.0 03/14/2016 2002   GLUCOSEU NEGATIVE 03/14/2016 2002   HGBUR  TRACE (A) 03/14/2016 2002   BILIRUBINUR NEGATIVE 03/14/2016 2002   [place] 03/14/2016 2002   PROTEINUR 30 (A) 03/14/2016 2002   NITRITE NEGATIVE 03/14/2016 2002   LEUKOCYTESUR NEGATIVE 03/14/2016 2002   Sepsis Labs: @LABRCNTIP (procalcitonin:4,lacticidven:4)  Results for orders placed or performed during the hospital encounter of 03/14/16  Blood Culture (routine x 2)     Status: None (Preliminary result)   Collection Time: 03/14/16 10:34 AM  Result Value Ref Range Status   Specimen Description BLOOD LEFT ANTECUBITAL  Final   Special Requests BOTTLES DRAWN AEROBIC AND ANAEROBIC 5ML  Final   Culture   Final    NO GROWTH 3 DAYS Performed at [facility]    Report Status PENDING  Incomplete  Blood Culture (routine x 2)     Status: None (Preliminary result)   Collection Time: 03/14/16 10:46 AM  Result Value Ref Range Status   Specimen Description BLOOD LEFT HAND  Final   Special Requests BOTTLES DRAWN AEROBIC AND ANAEROBIC 5ML  Final   Culture   Final    NO GROWTH 3 DAYS Performed at [facility]    Report Status PENDING  Incomplete  Anaerobic culture     Status: None (Preliminary result)   Collection Time: 03/14/16  3:01 PM  Result Value Ref Range Status   Specimen Description   Final    ABSCESS IRRIGATION AND DEBRIDEMENT SCROTAL ABCESS AND CYTOSCOPY   Special Requests NONE  Final   Culture   Final    HOLDING FOR POSSIBLE ANAEROBE Performed at [facility]      Report Status PENDING  Incomplete  Aerobic Culture (superficial specimen)     Status: None   Collection Time: 03/14/16  3:01 PM  Result Value Ref Range Status   Specimen Description   Final    ABSCESS IRRIGATION AND DEBRIDEMENT SCROTAL ABCESS AND CYTOSCOPY   Special Requests NONE  Final   Gram Stain   Final    FEW WBC PRESENT, PREDOMINANTLY MONONUCLEAR ABUNDANT GRAM NEGATIVE RODS FEW GRAM POSITIVE COCCI IN PAIRS FEW GRAM POSITIVE RODS    Culture   Final    NORMAL SKIN FLORA Performed at [facility]    Report Status 03/17/2016 FINAL  Final  Fungus Stain     Status: None   Collection Time: 03/14/16  3:01 PM  Result Value Ref Range Status   FUNGUS STAIN Final report  Final    Comment: (NOTE) Performed At: [facility] [place], [place] [id number] Lindon Romp MD EX:[id number]    Fungal Source ABSCESS  Final    Comment: IRRIGATION AND DEBRIDEMENT SCROTAL ABCESS AND CYTOSCOPY  Fungal Stain reflex     Status: None   Collection Time: 03/14/16  3:01 PM  Result Value Ref Range Status   Fungal stain result 1 Comment  Final    Comment: (NOTE) KOH/Calcofluor preparation:  no fungus observed. Performed At: [facility] [place], [place] [id number] Lindon Romp MD [id number]   MRSA PCR Screening     Status: None   Collection Time: 03/14/16  5:32 PM  Result Value Ref Range Status   MRSA by PCR NEGATIVE NEGATIVE Final    Comment:        The GeneXpert MRSA Assay (FDA approved for NASAL specimens only), is one component of a comprehensive MRSA colonization surveillance program. It is not intended to diagnose MRSA infection nor to guide or monitor treatment for MRSA infections.   Urine  culture     Status: None   Collection Time: 03/14/16  8:02 PM  Result Value Ref Range Status   Specimen Description URINE, CATHETERIZED  Final   Special Requests NONE  Final   Culture NO GROWTH Performed at [facility]    Final   Report Status 03/16/2016 FINAL  Final     Radiology Studies: Ct Abdomen Pelvis W Contrast Result Date: 03/14/2016  1. CT findings consistent with Fournier's gangrene, with extensive skin thickening, subcutaneous fat stranding and subcutaneous emphysema throughout the perineum, bilateral medial buttocks, right ischiorectal fossa and scrotum. 2. Subcutaneous abscess in the right perineum. Large right hydrocele. No ascites or intraperitoneal gas or fluid collections. 3. Additional findings include aortic atherosclerosis, coronary atherosclerosis, centrilobular emphysema and diffuse hepatic steatosis. These results were called by telephone at the time of interpretation on 03/14/2016 at 12:29 pm to Dr. Addison Lank , who verbally acknowledged these results. Electronically Signed   By: [name] M.D.   On: 03/14/2016 12:32   Dg Chest Port 1 View Result Date: 03/14/2016  1. Mild right base infiltrate cannot be excluded. 2. Mild cardiomegaly.   Dg Chest Port 1 View Result Date: 03/16/2016 Cardiomegaly and pulmonary venous congestion without definitive superimposed acute cardiopulmonary disease on these degraded AP portable examinations. Further evaluation with a PA and lateral chest radiographs may be obtained as clinically indicated.     Scheduled Meds: . aspirin EC  325 mg Oral QPM  . colesevelam  1,250 mg Oral BID WC  . fenofibrate  160 mg Oral Daily  . insulin aspart  0-9 Units Subcutaneous TID WC  . insulin aspart  3 Units Subcutaneous TID WC  . insulin glargine  8 Units Subcutaneous Daily  . ipratropium-albuterol  3 mL Nebulization TID  . magnesium oxide  400 mg Oral BID  . metoprolol tartrate  12.5 mg Oral BID  . piperacillin-tazobactam (ZOSYN)  IV  3.375 g Intravenous Q8H  . rosuvastatin  5 mg Oral QPM  . vancomycin  1,000 mg Intravenous Q8H   Continuous Infusions:     LOS: 4 days    Time spent: 25 minutes  Greater than 50% of the time spent on counseling and  coordinating the care.   Leisa Lenz, MD [facility] Pager [phone]  If 7PM-7AM, please contact night-coverage [email or web address] Password [email or web address] 03/18/2016, 10:23 AM

## 2016-03-18 NOTE — Transfer of Care (Signed)
Immediate Anesthesia Transfer of Care Note  Patient: Jonathan Tucker  Procedure(s) Performed: Procedure(s): DEBRIDEMENT AND PARTICAL CLOSURE PERINIAL WOUND (N/A)  Patient Location: PACU  Anesthesia Type:General  Level of Consciousness: awake, alert  and oriented  Airway & Oxygen Therapy: Patient Spontanous Breathing and Patient connected to face mask oxygen  Post-op Assessment: Report given to RN and Post -op Vital signs reviewed and stable  Post vital signs: Reviewed and stable  Last Vitals:  Vitals:   03/18/16 1200 03/18/16 1300  BP: 134/69   Pulse: 81 82  Resp: 20 18  Temp:      Last Pain:  Vitals:   03/18/16 1347  TempSrc:   PainSc: 4       Patients Stated Pain Goal: 2 ([id number])  Complications: No apparent anesthesia complications

## 2016-03-18 NOTE — Anesthesia Preprocedure Evaluation (Addendum)
Anesthesia Evaluation  Patient identified by MRN, date of birth, ID band Patient awake    Reviewed: Allergy & Precautions, NPO status , Patient's Chart, lab work & pertinent test results  Airway Mallampati: II  TM Distance: >3 FB Neck ROM: Full    Dental  (+) Edentulous Upper   Pulmonary COPD, Current Smoker,    Pulmonary exam normal breath sounds clear to auscultation       Cardiovascular hypertension, Normal cardiovascular exam Rhythm:Regular Rate:Normal     Neuro/Psych negative neurological ROS  negative psych ROS   GI/Hepatic negative GI ROS, Neg liver ROS,   Endo/Other  diabetes, Poorly Controlled  Renal/GU Renal disease  negative genitourinary   Musculoskeletal negative musculoskeletal ROS (+)   Abdominal   Peds negative pediatric ROS (+)  Hematology negative hematology ROS (+)   Anesthesia Other Findings   Reproductive/Obstetrics negative OB ROS                            Anesthesia Physical Anesthesia Plan  ASA: III  Anesthesia Plan: General   Post-op Pain Management:    Induction: Intravenous  Airway Management Planned: LMA  Additional Equipment:   Intra-op Plan:   Post-operative Plan: Extubation in OR  Informed Consent: I have reviewed the patients History and Physical, chart, labs and discussed the procedure including the risks, benefits and alternatives for the proposed anesthesia with the patient or authorized representative who has indicated his/her understanding and acceptance.   Dental advisory given  Plan Discussed with: CRNA  Anesthesia Plan Comments:         Anesthesia Quick Evaluation

## 2016-03-18 NOTE — Anesthesia Postprocedure Evaluation (Signed)
Anesthesia Post Note  Patient: Jonathan Tucker  Procedure(s) Performed: Procedure(s) (LRB): DEBRIDEMENT AND PARTICAL CLOSURE PERINIAL WOUND (N/A)  Patient location during evaluation: PACU Anesthesia Type: General Level of consciousness: awake and alert Pain management: pain level controlled Vital Signs Assessment: post-procedure vital signs reviewed and stable Respiratory status: spontaneous breathing, nonlabored ventilation, respiratory function stable and patient connected to nasal cannula oxygen Cardiovascular status: blood pressure returned to baseline and stable Postop Assessment: no signs of nausea or vomiting Anesthetic complications: no    Last Vitals:  Vitals:   03/18/16 2000 03/18/16 2010  BP: (!) 153/84   Pulse: 87   Resp: 18   Temp:  36.6 C    Last Pain:  Vitals:   03/18/16 2010  TempSrc: Oral  PainSc:                  [name]

## 2016-03-18 NOTE — Progress Notes (Signed)
Subjective:  Pt going back to OR today. No cardiac complaints overnight.  Objective:  Vital Signs in the last 24 hours: Temp:  [97.6 F (36.4 C)-98.9 F (37.2 C)] 98.3 F (36.8 C) (10/03 0741) Pulse Rate:  [70-94] 74 (10/03 1000) Resp:  [13-24] 16 (10/03 1000) BP: (108-144)/(54-85) 144/85 (10/03 1000) SpO2:  [91 %-99 %] 98 % (10/03 1000)  Intake/Output from previous day:  Intake/Output Summary (Last 24 hours) at 03/18/16 1112 Last data filed at 03/18/16 1000  Gross per 24 hour  Intake             1030 ml  Output             5850 ml  Net            -4820 ml    Physical Exam: General appearance: alert, cooperative, no distress and moderately obese Neck: no JVD Lungs: scattered wheezing c/w COPD Heart: regular rate and rhythm Neurologic: Grossly normal   Rate: 78  Rhythm: normal sinus rhythm and 5 bt NSWCT  Lab Results:  Recent Labs  03/16/16 0317 03/17/16 0306  WBC 14.5* 12.2*  HGB 10.9* 10.6*  PLT 228 300    Recent Labs  03/16/16 0317 03/18/16 0314  NA 135 137  K 3.7 3.9  CL 104 101  CO2 24 31  GLUCOSE 130* 153*  BUN 11 6  CREATININE 0.67 0.74   No results for input(s): TROPONINI in the last 72 hours.  Invalid input(s): CK, MB No results for input(s): INR in the last 72 hours.  Scheduled Meds: . aspirin EC  325 mg Oral QPM  . colesevelam  1,250 mg Oral BID WC  . fenofibrate  160 mg Oral Daily  . insulin aspart  0-9 Units Subcutaneous TID WC  . insulin aspart  3 Units Subcutaneous TID WC  . insulin glargine  8 Units Subcutaneous Daily  . ipratropium-albuterol  3 mL Nebulization TID  . magnesium oxide  400 mg Oral BID  . metoprolol tartrate  12.5 mg Oral BID  . piperacillin-tazobactam (ZOSYN)  IV  3.375 g Intravenous Q8H  . rosuvastatin  5 mg Oral QPM  . vancomycin  1,000 mg Intravenous Q8H   Continuous Infusions:  PRN Meds:.acetaminophen **OR** acetaminophen, fentaNYL (SUBLIMAZE) injection, haloperidol lactate, ipratropium-albuterol,  ondansetron **OR** ondansetron (ZOFRAN) IV, ondansetron (ZOFRAN) IV, senna-docusate, zolpidem   Imaging: Dg Chest Port 1 View  Result Date: 03/16/2016 CLINICAL DATA:  Post I&D today now with confusion, hypoxia and hypotension. EXAM: PORTABLE CHEST 1 VIEW COMPARISON:  03/12/2016; 06/27/2010 FINDINGS: Examination is degraded secondary to exclusion of the left costophrenic angle. Grossly unchanged enlarged cardiac silhouette and mediastinal contours given reduced lung volumes. Atherosclerotic plaque within the thoracic aorta. Pulmonary venous congestion without frank evidence of edema. Worsening bilateral infrahilar opacities favored to represent atelectasis. No discrete focal airspace opacities. No definite pleural effusion, though note, the left costophrenic angle is excluded from view. No pneumothorax. No acute osseus abnormalities. IMPRESSION: Cardiomegaly and pulmonary venous congestion without definitive superimposed acute cardiopulmonary disease on these degraded AP portable examinations. Further evaluation with a PA and lateral chest radiographs may be obtained as clinically indicated. Electronically Signed   By: Sandi Mariscal M.D.   On: 03/16/2016 14:57    Cardiac Studies: Echo pending  Assessment/Plan:  65 y/o overweight Caucasian male with a history of DM, COPD-2ppd smoker, HTN, and dyslipidemia, admitted 03/14/16 with sepsis and Fournier's gangrene. He underwent I&D 03/14/16 and debridement 03/16/16. After his debridement 10/1 he  developed respiratory failure requiring BiPap and went into rapid AF.   Principal Problem:   Fournier's gangrene in male Active Problems:   Uncontrolled diabetes mellitus ([place])   Hyponatremia   Leukocytosis   Acute renal insufficiency   Current every day smoker   Severe sepsis ([facility])   Rigors   COPD (chronic obstructive pulmonary disease) (H[facility])   Atrial fibrillation with RVR (H[facility])   Essential hypertension   Dyslipidemia   PLAN: Low dose lopressor added  yesterday. He is maintaining NSR. Echo done and pending. For I&D today- he is not anticoagulated.   Kerin Ransom PA-C 03/18/2016, 11:12 AM [phone]  Agree with above. Was off of the floor to OR at the time.   ECHO: - Left ventricle: The cavity size was normal. Systolic function was   normal. The estimated ejection fraction was in the range of 55%   to 60%. Wall motion was normal; there were no regional wall   motion abnormalities. - Left atrium: The atrium was mildly dilated.  Reassuring ECHO. No further recommendations at this time.  Will sign off. Please call with any questions.   Candee Furbish, MD

## 2016-03-18 NOTE — Progress Notes (Signed)
Pharmacy Antibiotic Follow-up Note  Jonathan Tucker is a 65 y.o. year-old male admitted on 03/14/2016.  Started on Vancomycin & Zosyn for perineal infection and Fournier's gangrene.  10/3:  - Day #5 antibiotics - S/p OR debridement 9/29, 10/1 - Plan for repeat debridement today 10/3 in OR due to Afib/resp issues after last I/D - VT this AM = 14 mcg/mL, slightly under goal but expect accumulation with q8h regimen therefore no change to dose  Assessment/Plan: - Continue Vanc to 1g q8h. Goal trough 15-67mcg/ml. - Cont Zosyn 3.375g IV Q8H infused over 4hrs. - Recheck Vanc trough at steady state as warranted - Follow up renal fxn, culture results, and clinical course.   Temp (24hrs), Avg:98.3 F (36.8 C), Min:97.6 F (36.4 C), Max:98.9 F (37.2 C)   Recent Labs Lab 03/14/16 1034 03/15/16 0122 03/16/16 0317 03/17/16 0306  WBC 17.0* 14.2* 14.5* 12.2*     Recent Labs Lab 03/14/16 1034 03/15/16 0122 03/16/16 0317 03/18/16 0314  CREATININE 1.34* 1.08 0.67 0.74   Estimated Creatinine Clearance: 99.7 mL/min (by C-G formula based on SCr of 0.74 mg/dL).    Allergies  Allergen Reactions  . Codeine     Does not tolerate, tolerates hydrocodone   . Flexeril [Cyclobenzaprine]     Burning/itching    Antimicrobials this admission:  9/29 Clinda x1 9/29 Zosyn >>  9/29 Vanc >>  Dose adjustments this admission:  10/1: VT = 74mcg/ml on 750mg  q12h, increase to 1g q8h. 10/3: VT = 28mcg/ml on 1g IV q8h - no change  Microbiology results:  9/29 MRSA PCR: neg 9/29 BCx: NGTD 9/29 wound abscess: normal skin flora 9/29 wound abscess anaerobic: NGTD 9/29 UCx: NGF  Thank you for allowing pharmacy to be a part of this patient's care.  Ralene Bathe, PharmD, BCPS 03/18/2016, 8:18 AM  Pager: [phone]

## 2016-03-18 NOTE — Op Note (Signed)
03/14/2016 - 03/18/2016  4:06 PM  PATIENT:  Jonathan Tucker  65 y.o. male  Patient Care Team: Lennie Odor, PA-C as PCP - General (Nurse Practitioner)  PRE-OPERATIVE DIAGNOSIS:  FORNIER'S GANGRENE GROIN  POST-OPERATIVE DIAGNOSIS:  FORNIER'S GANGRENE GROIN  PROCEDURE:  Procedure(s): [place]   Surgeon(s): Leighton Ruff, MD Bjorn Loser, MD   ANESTHESIA:   general  EBL: 56ml  Total I/O In: [id number] [I.V.:800; Other:30; IV Piggyback:250] Out: 615 [Urine:600; Blood:15]  DRAINS: none   SPECIMEN:  No Specimen  DISPOSITION OF SPECIMEN:  N/A  COUNTS:  YES  PLAN OF CARE: Pt admitted  PATIENT DISPOSITION:  PACU - hemodynamically stable.  INDICATION: 65 year old male who underwent significant debridement of Fournier's gangrene over the weekend. He is here for a second look procedure and possible closure of part of his wound.   OR FINDINGS: Good granulation tissue. No sign of progressive disease.  DESCRIPTION: the patient was identified in the preoperative holding area and taken to the OR where they were laid supine on the operating room table.  General anesthesia was induced without difficulty. The patient was then placed in lithotomy position.   SCDs were also noted to be in place prior to the initiation of anesthesia.  The patient was then prepped and draped in the usual sterile fashion.   A surgical timeout was performed indicating the correct patient, procedure, positioning and need for preoperative antibiotics.   The old dressing was removed. The wound was prepped with Betadine. I trimmed off a small portion of skin adjuvant that had become necrotic. I evaluated the women with Dr. Vikki Ports. He agreed that no further debridement needed to be done. I helped him mobilize the scrotum so that it could be partially closed. After this was completed I placed 0 Vicryl sutures on the anterior edges of the perianal skin flaps to bring this together  slightly. I then packed a Kerlix sponge into each cavity laterally and one into the anterior cavity. A dressing was then applied. The patient was awakened from anesthesia and sent to the postanesthesia care unit in stable condition. All counts were correct per operating room staff.

## 2016-03-18 NOTE — Op Note (Signed)
Preoperative diagnosis: Fournier's gangrene of scrotum and perineum Postoperative diagnosis: Fournier's gangrene of scrotum and perineum and moderate size hydrocele Surgery: Minimal debridement of Fournier's gangrene from scrotum; aspiration of hydrocele fluid; partial closure of Fournier's gangrene scrotal area Surgeon: Dr. Nicki Reaper Sundiata Ferrick  The patient has the above diagnoses and consented the above procedure. Myself a Gen. surgery went back to further debride any necrotic tissue. There was minimal tissue to debride in the scrotal area. The granulation tissue was excellent. He still had is moderate size hydrocele. I really felt that if I could make the hydrocele smaller and developed some tissue planes we could really pull some of the mobile tissue and skin medially to speed up healing. I felt the tissue was definitely healthy enough to do so.  I developed a plane with cautery and cutting current with careful dissection to mobilize a skin flap on the patient's right side off the hydrocele cephalad. I mobilized nicely. I left the skin flap very thick and healthy. I was cognizant to do so during the dissection.  Even with this mobilization I felt that aspiration and make an hydrocele smaller would be prudent and beneficial. I did not want to do a formal hydrocele repair in this setting. An 18-gauge needle was placed into the hydrocele and with gentle squeezing pressure I removed likely approximately 200 mL of clear fluid to yellow clear fluid. I wiped the area with Hibiclens.  I with Dr. Marcello Moores utilizing interrupted 2-0 Vicryl to partially close the scrotal area touching in the testicles. This closure looked excellent allowing [place] still the pack. She did the same near the rectal area. Packing and dressing was applied. The patient will be followed as per protocol.

## 2016-03-18 NOTE — Anesthesia Procedure Notes (Signed)
Procedure Name: LMA Insertion Date/Time: 03/18/2016 2:34 PM Performed by: Christell Faith L Pre-anesthesia Checklist: Patient identified, Emergency Drugs available, Suction available, Patient being monitored and Timeout performed Patient Re-evaluated:Patient Re-evaluated prior to inductionOxygen Delivery Method: Circle system utilized Preoxygenation: Pre-oxygenation with 100% oxygen Intubation Type: IV induction Ventilation: Mask ventilation without difficulty LMA: LMA inserted LMA Size: 4.0 Number of attempts: 1 Placement Confirmation: positive ETCO2,  CO2 detector and breath sounds checked- equal and bilateral Tube secured with: Tape Dental Injury: Teeth and Oropharynx as per pre-operative assessment

## 2016-03-18 NOTE — Progress Notes (Signed)
*  PRELIMINARY RESULTS* Echocardiogram TTE has been performed.  Leavy Cella 03/18/2016, 1:46 PM

## 2016-03-19 ENCOUNTER — Encounter (HOSPITAL_COMMUNITY): Payer: Self-pay | Admitting: General Surgery

## 2016-03-19 DIAGNOSIS — I4891 Unspecified atrial fibrillation: Secondary | ICD-10-CM

## 2016-03-19 LAB — CULTURE, BLOOD (ROUTINE X 2)
Culture: NO GROWTH
Culture: NO GROWTH

## 2016-03-19 LAB — CBC
HEMATOCRIT: 35 % — AB (ref 39.0–52.0)
HEMOGLOBIN: 11.4 g/dL — AB (ref 13.0–17.0)
MCH: 30.9 pg (ref 26.0–34.0)
MCHC: 32.6 g/dL (ref 30.0–36.0)
MCV: 94.9 fL (ref 78.0–100.0)
PLATELETS: 407 10*3/uL — AB (ref 150–400)
RBC: 3.69 MIL/uL — AB (ref 4.22–5.81)
RDW: 14.5 % (ref 11.5–15.5)
WBC: 10 10*3/uL (ref 4.0–10.5)

## 2016-03-19 LAB — BASIC METABOLIC PANEL
Anion gap: 4 — ABNORMAL LOW (ref 5–15)
BUN: 6 mg/dL (ref 6–20)
CHLORIDE: 98 mmol/L — AB (ref 101–111)
CO2: 35 mmol/L — AB (ref 22–32)
Calcium: 8.3 mg/dL — ABNORMAL LOW (ref 8.9–10.3)
Creatinine, Ser: 0.64 mg/dL (ref 0.61–1.24)
GFR calc Af Amer: >60 mL/min (ref >60)
GFR calc non Af Amer: >60 mL/min (ref >60)
GLUCOSE: 154 mg/dL — AB (ref 65–99)
POTASSIUM: 4.1 mmol/L (ref 3.5–5.1)
Sodium: 137 mmol/L (ref 135–145)

## 2016-03-19 LAB — GLUCOSE, CAPILLARY
GLUCOSE-CAPILLARY: 148 mg/dL — AB (ref 65–99)
Glucose-Capillary: 135 mg/dL — ABNORMAL HIGH (ref 65–99)
Glucose-Capillary: 131 mg/dL — ABNORMAL HIGH (ref 65–99)
Glucose-Capillary: 135 mg/dL — ABNORMAL HIGH (ref 65–99)

## 2016-03-19 LAB — ANAEROBIC CULTURE

## 2016-03-19 MED ORDER — HYDROCODONE-ACETAMINOPHEN 5-325 MG PO TABS
2.0000 | ORAL_TABLET | Freq: Four times a day (QID) | ORAL | Status: DC | PRN
  Administered 2016-03-19 – 2016-03-22 (×9): 2 via ORAL
  Filled 2016-03-19 (×9): qty 2

## 2016-03-19 MED ORDER — HYDROMORPHONE HCL 2 MG/ML IJ SOLN
2.0000 mg | INTRAMUSCULAR | Status: DC | PRN
  Administered 2016-03-19: 1 mg via INTRAVENOUS
  Administered 2016-03-20 – 2016-03-21 (×2): 2 mg via INTRAVENOUS
  Filled 2016-03-19 (×3): qty 1

## 2016-03-19 MED ORDER — HYDROMORPHONE HCL 1 MG/ML IJ SOLN
INTRAMUSCULAR | Status: AC
  Administered 2016-03-19: 0.5 mg via INTRAVENOUS
  Filled 2016-03-19: qty 1

## 2016-03-19 MED ORDER — HYDROMORPHONE HCL 1 MG/ML IJ SOLN
0.5000 mg | Freq: Once | INTRAMUSCULAR | Status: AC
  Administered 2016-03-19: 0.5 mg via INTRAVENOUS

## 2016-03-19 MED ORDER — HYDROMORPHONE HCL 1 MG/ML IJ SOLN
INTRAMUSCULAR | Status: AC
  Filled 2016-03-19: qty 1

## 2016-03-19 MED ORDER — IPRATROPIUM-ALBUTEROL 0.5-2.5 (3) MG/3ML IN SOLN: 3.0000 mL | Freq: Four times a day (QID) | RESPIRATORY_TRACT | Status: DC | PRN

## 2016-03-19 NOTE — Progress Notes (Signed)
[facility] Progress Note  1 Day Post-Op  Subjective: Sitting in bed, NAD. Wife at bedside. Rates pain around scrotum/groin at 8-9/10. Tolerating PO. Last BM was yesterday.  Objective: Vital signs in last 24 hours: Temp:  [97.8 F (36.6 C)-98.5 F (36.9 C)] 97.8 F (36.6 C) (10/04 0400) Pulse Rate:  [74-94] 78 (10/04 0600) Resp:  [13-23] 14 (10/04 0600) BP: (99-155)/(42-86) 116/64 (10/04 0600) SpO2:  [87 %-98 %] 96 % (10/04 0600) FiO2 (%):  [98 %] 98 % (10/03 1347) Last BM Date: 03/18/16  Intake/Output from previous day: 10/03 0701 - 10/04 0700 In: 1380 [I.V.:800; IV Piggyback:550] Out: 2415 [Urine:2400; Blood:15]  PE: Gen:  Alert, NAD, pleasant and cooperative Card:  RRR, no M/G/R heard Pulm:  CTABL GU: packing removed from inferior aspect of scrotum and perineum and replaced with saline-soaked kerlix (just under 1 kerlix). No necrotic tissue appreciated, tissue was healthy and pink with minimal yellow slough. Wound was repacked in 4 places: inferior scrotum, perineum (largest defect), and lateral to the sutures placed in perianal region.  Lab Results:   Recent Labs  03/18/16 1900 03/19/16 0320  WBC 12.0* 10.0  HGB 12.4* 11.4*  HCT 38.3* 35.0*  PLT 377 407*   BMET  Recent Labs  03/18/16 1900 03/19/16 0320  NA 139 137  K 4.2 4.1  CL 101 98*  CO2 32 35*  GLUCOSE 145* 154*  BUN 6 6  CREATININE 0.62 0.64  CALCIUM 8.4* 8.3*   CMP     Component Value Date/Time   NA 137 03/19/2016 0320   K 4.1 03/19/2016 0320   CL 98 (L) 03/19/2016 0320   CO2 35 (H) 03/19/2016 0320   GLUCOSE 154 (H) 03/19/2016 0320   BUN 6 03/19/2016 0320   CREATININE 0.64 03/19/2016 0320   CALCIUM 8.3 (L) 03/19/2016 0320   PROT 4.3 (L) 03/15/2016 0122   ALBUMIN 1.9 (L) 03/15/2016 0122   AST 22 03/15/2016 0122   ALT 10 (L) 03/15/2016 0122   ALKPHOS 39 03/15/2016 0122   BILITOT 1.2 03/15/2016 0122   GFRNONAA >60 03/19/2016 0320   GFRAA >60 03/19/2016 0320    Anti-infectives: Anti-infectives    Start     Dose/Rate Route Frequency Ordered Stop   03/16/16 1600  vancomycin (VANCOCIN) IVPB 1000 mg/200 mL premix     1,000 mg 200 mL/hr over 60 Minutes Intravenous Every 8 hours 03/16/16 1131     03/14/16 2200  vancomycin (VANCOCIN) IVPB 750 mg/150 ml premix  Status:  Discontinued     750 mg 150 mL/hr over 60 Minutes Intravenous Every 12 hours 03/14/16 1220 03/16/16 1131   03/14/16 2000  piperacillin-tazobactam (ZOSYN) IVPB 3.375 g     3.375 g 12.5 mL/hr over 240 Minutes Intravenous Every 8 hours 03/14/16 1218     03/14/16 1520  polymyxin B 500,000 Units, bacitracin 50,000 Units in sodium chloride irrigation 0.9 % 500 mL irrigation  Status:  Discontinued       As needed 03/14/16 1521 03/14/16 1603   03/14/16 1045  vancomycin (VANCOCIN) IVPB 1000 mg/200 mL premix     1,000 mg 200 mL/hr over 60 Minutes Intravenous  Once 03/14/16 1033 03/14/16 1348   03/14/16 1030  clindamycin (CLEOCIN) IVPB 900 mg     900 mg 100 mL/hr over 30 Minutes Intravenous  Once 03/14/16 1019 03/14/16 1246   03/14/16 1030  piperacillin-tazobactam (ZOSYN) IVPB 3.375 g     3.375 g 100 mL/hr over 30 Minutes Intravenous  Once 03/14/16 1027  03/14/16 1304     Assessment/Plan Fournier's Gangrene    S/p Irrigation and debridement 03/15/16, Dr. Harlow Asa and Dr. Lorra Hals    Dressing change in [place], limited debridement 03/16/16, Dr. Harlow Asa and Dr. Lorra Hals    Debridement and partial closure perineal wound 03/18/16. Dr. Leighton Ruff and Dr. Lorra Hals     Bedside dressing change today 03/19/16 - patient tolerated well with 37.5 of fentanyl, and 1.0 of dilaudid.   FEN: Regular  ID: Vancomycin, Zosyn  Plan: continue daily dressing changes at bedside, pre-medicating with dilaudid.  Will discuss transferring patient to the floor with MD, as the patient tolerated the dressing change well.   LOS: 5 days    [facility] 03/19/2016, 7:44  AM Pager: [phone] Consults: [phone] Mon-Fri 7:00 am-4:30 pm Sat-Sun 7:00 am-11:30 am

## 2016-03-19 NOTE — Consult Note (Signed)
Urology Consult Note   Requesting Attending Physician:  Louellen Molder, MD Service Providing Consult: Urology Consulting Attending: Matilde Sprang, MD  Assessment:  Patient is a 65 y.o. 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 S/p OR debridement of necrotic tissue 9/29, 10/1, and 10/3 with aspiration of right hydrocele on 10/3.   Interval: AFVSS. NAEON.  2.4L UOP via Foley. Cr 0.6. WBC 10. 9/29 blood cultures NGTD.  Recommendations: 1. Agree with broad spectrum antibiotics 2. Dressing to be changed today at bedside per general surgery. Urology is available for assistance PRN (can call [phone], Cleotis Lema - resident)  3. Appreciate medicine's involvement in this patient's care, DM control/CAD, etc. 4. Continue Foley catheter to straight drain  Thank you for this consult. Please contact the urology consult pager with any further questions/concerns. Sharmaine Base, MD Urology Surgical Resident  Subjective: Jonathan Tucker. No n/v. Pain is better, being controlled with meds. Tolerating reg diet. Nervous about bedside dressing change.  Objective   Vital signs in last 24 hours: BP 116/64   Pulse 78   Temp 98.1 F (36.7 C) (Oral)   Resp 14   Ht 6' (1.829 m)   Wt 190 lb (86.2 kg)   SpO2 96%   BMI 25.77 kg/m   Intake/Output last 3 shifts: I/O last 3 completed shifts: In: [id number] [I.V.:800; Other:90; IV [id number] Out: [id number] [Urine:5450; Blood:15]  Physical Exam General: NAD, A&O HEENT: [place]/AT, EOMI, MMM, [place] in place Pulmonary: Normal work of breathing Cardiovascular: Regular rate & rhythm, HDS, adequate peripheral perfusion Abdomen: soft, NTTP, nondistended, no suprapubic fullness or tenderness GU: Foley draining yellow urine, mesh underwear in place with no significant soak through, overlying gauze in wound bed appears dry with no soak through, scrotal & perineal dressing with minimal SS soakthrough Extremities: warm and well  perfused, no edema   Most Recent Labs: Lab Results  Component Value Date   WBC 10.0 03/19/2016   HGB 11.4 (L) 03/19/2016   HCT 35.0 (L) 03/19/2016   PLT 407 (H) 03/19/2016    Lab Results  Component Value Date   NA 137 03/19/2016   K 4.1 03/19/2016   CL 98 (L) 03/19/2016   CO2 35 (H) 03/19/2016   BUN 6 03/19/2016   CREATININE 0.64 03/19/2016   CALCIUM 8.3 (L) 03/19/2016   MG 1.6 (L) 03/15/2016   PHOS 2.9 03/15/2016    Lab Results  Component Value Date   ALKPHOS 39 03/15/2016   BILITOT 1.2 03/15/2016   PROT 4.3 (L) 03/15/2016   ALBUMIN 1.9 (L) 03/15/2016   ALT 10 (L) 03/15/2016   AST 22 03/15/2016    No results found for: INR, APTT   Urine Culture: No growth   IMAGING: No new

## 2016-03-19 NOTE — Progress Notes (Signed)
PROGRESS NOTE                                                                                                                                                                                                             Patient Demographics:    Jonathan Tucker, is a 65 y.o. male, DOB - Nov 06, 1950, [id number]  Admit date - 03/14/2016   Admitting Physician Murlean Iba, MD  Outpatient Primary MD for the patient is [name],[name], PA-C  LOS - 5  Outpatient Specialists:none  Chief Complaint  Patient presents with  . Groin Swelling       Brief Narrative   65 year old male with type 2 diabetes mellitus, dyslipidemia, hypertension was referred for admission by urology with concern for Fournier's gangrene. Patient was septic in the ED with hypotension (BP 62/29 mmHg) cardiac to 110s and tachypnea. He was afebrile. Blood work showed leukocytosis with WBC of 17, sodium of 124 and lactic acid of 3. CT scan was concerning for Fournier's gangrene. Patient seen by surgery and underwent I&D of the perineal and scrotal abscess on 9/29 and 10/1 followed by debridement of the gangrene and aspiration of hydrocele fluid with partial closure of the gangrene on 10/3.   Subjective:   Patient complains of pain during dressing changes. Afebrile.   Assessment  & Plan :    Principal Problem: Severe sepsis secondary to Fournier's gangrene Underwent multiple debridements by surgery and urology. Continue empiric vancomycin and Zosyn. Blood and scrotal abscess culture have been negative so far. Pain control with hydrocodone and Dilaudid when necessary. Dressing for surgery. Continue Foley catheter.  Active Problems: Acute hypoxic respiratory failure (HCC) Became hypoxic after I&D on 10/1, chest x-ray showing pulmonary vascular congestion. Respiratory status currently stable. 2-D echo on 10/3 showing EF of 55 and 60% with no wall motion  abnormality.  New onset A. fib with RVR Suspected this is associated with sepsis. Heart rate well controlled with low-dose beta blocker. Continue aspirin. CHADS2vasc score of 1, no need for anticoagulation. 2-D echo reviewed. Appreciate cardiology evaluation.  Hydrocele of scrotum Aspirated on 10/3.      Uncontrolled diabetes mellitus ([place]) Worsened with sepsis. A1c of 6.5. Now stable on low dose Lantus with pre-meal aspart.    Hyponatremia Secondary to dehydration and sepsis. Now resolved.  Acute kidney injury ([place]) Secondary to sepsis. Resolved  Acute blood loss anemia Currently stable.  Dyslipidemia Continue home medications  Essential hypertension Continue beta blocker.         Code Status : Full code  Family Communication  : Wife at bedside  Disposition Plan  : Home once clinically improved  Barriers For Discharge : Active symptoms. Transfer to telemetry. Continue Foley catheter for now. PT evaluation once pain better controlled.  Consults  :   Surgery Neurology Cardiology  Procedures  :  I&D of the perineal and scrotal abscess on 9/29, 10/1 and 10/4  DVT Prophylaxis  : Lovenox  Lab Results  Component Value Date   PLT 407 (H) 03/19/2016    Antibiotics  :   Anti-infectives    Start     Dose/Rate Route Frequency Ordered Stop   03/16/16 1600  vancomycin (VANCOCIN) IVPB 1000 mg/200 mL premix     1,000 mg 200 mL/hr over 60 Minutes Intravenous Every 8 hours 03/16/16 1131     03/14/16 2200  vancomycin (VANCOCIN) IVPB 750 mg/150 ml premix  Status:  Discontinued     750 mg 150 mL/hr over 60 Minutes Intravenous Every 12 hours 03/14/16 1220 03/16/16 1131   03/14/16 2000  piperacillin-tazobactam (ZOSYN) IVPB 3.375 g     3.375 g 12.5 mL/hr over 240 Minutes Intravenous Every 8 hours 03/14/16 1218     03/14/16 1520  polymyxin B 500,000 Units, bacitracin 50,000 Units in sodium chloride irrigation 0.9 % 500 mL irrigation  Status:  Discontinued       As  needed 03/14/16 1521 03/14/16 1603   03/14/16 1045  vancomycin (VANCOCIN) IVPB 1000 mg/200 mL premix     1,000 mg 200 mL/hr over 60 Minutes Intravenous  Once 03/14/16 1033 03/14/16 1348   03/14/16 1030  clindamycin (CLEOCIN) IVPB 900 mg     900 mg 100 mL/hr over 30 Minutes Intravenous  Once 03/14/16 1019 03/14/16 1246   03/14/16 1030  piperacillin-tazobactam (ZOSYN) IVPB 3.375 g     3.375 g 100 mL/hr over 30 Minutes Intravenous  Once 03/14/16 1027 03/14/16 1304        Objective:   Vitals:   03/19/16 0900 03/19/16 1000 03/19/16 1200 03/19/16 1301  BP: (!) 152/78 (!) 110/52 117/71 125/78  Pulse: 74 75 72 70  Resp: 17 12 15 18   Temp:   97.4 F (36.3 C) 97.4 F (36.3 C)  TempSrc:   Oral Oral  SpO2: 96% 94% 96% 97%  Weight:      Height:        Wt Readings from Last 3 Encounters:  03/14/16 86.2 kg (190 lb)     Intake/Output Summary (Last 24 hours) at 03/19/16 1311 Last data filed at 03/19/16 1227  Gross per 24 hour  Intake             1300 ml  Output             2765 ml  Net            -1465 ml     Physical Exam  Gen: not in distress HEENT: no pallor, moist mucosa, supple neck Chest: clear b/l, no added sounds CVS: N S1&S2, no murmurs, rubs or gallop GI: soft, NT, ND, BS+, Dressing over scrotum, Foley catheter+ Musculoskeletal: warm, no edema CNS: Alert and oriented, nonfocal    Data Review:    CBC  Recent Labs Lab 03/14/16 1034 03/15/16 0122 03/16/16 0317 03/17/16 0306 03/18/16 1900 03/19/16 0320  WBC 17.0* 14.2* 14.5* 12.2* 12.0* 10.0  HGB 13.9 10.5* 10.9* 10.6* 12.4* 11.4*  HCT 38.1* 30.5* 32.0* 31.7* 38.3* 35.0*  PLT 169 181 228 300 377 407*  MCV 86.0 90.0 91.4 92.4 94.3 94.9  MCH 31.4 31.0 31.1 30.9 30.5 30.9  MCHC 36.5* 34.4 34.1 33.4 32.4 32.6  RDW 13.2 13.8 14.1 14.4 14.4 14.5  LYMPHSABS 0.7 0.7  --   --   --   --   MONOABS 1.4* 1.0  --   --   --   --   EOSABS 0.0 0.0  --   --   --   --   BASOSABS 0.0 0.1  --   --   --   --      Chemistries   Recent Labs Lab 03/14/16 1034 03/15/16 0122 03/16/16 0317 03/18/16 0314 03/18/16 1900 03/19/16 0320  NA 124* 127* 135 137 139 137  K 3.7 4.2 3.7 3.9 4.2 4.1  CL 88* 100* 104 101 101 98*  CO2 24 21* 24 31 32 35*  GLUCOSE 208* 167* 130* 153* 145* 154*  BUN 27* 22* 11 6 6 6   CREATININE 1.34* 1.08 0.67 0.74 0.62 0.64  CALCIUM 8.5* 6.7* 7.4* 8.1* 8.4* 8.3*  MG  --  1.6*  --   --   --   --   AST 20 22  --   --   --   --   ALT 16* 10*  --   --   --   --   ALKPHOS 58 39  --   --   --   --   BILITOT 0.9 1.2  --   --   --   --    ------------------------------------------------------------------------------------------------------------------ No results for input(s): CHOL, HDL, LDLCALC, TRIG, CHOLHDL, LDLDIRECT in the last 72 hours.  Lab Results  Component Value Date   HGBA1C 6.5 (H) 03/14/2016   ------------------------------------------------------------------------------------------------------------------  Recent Labs  03/18/16 0314  TSH 2.964   ------------------------------------------------------------------------------------------------------------------ No results for input(s): VITAMINB12, FOLATE, FERRITIN, TIBC, IRON, RETICCTPCT in the last 72 hours.  Coagulation profile No results for input(s): INR, PROTIME in the last 168 hours.  No results for input(s): DDIMER in the last 72 hours.  Cardiac Enzymes No results for input(s): CKMB, TROPONINI, MYOGLOBIN in the last 168 hours.  Invalid input(s): CK ------------------------------------------------------------------------------------------------------------------ No results found for: BNP  Inpatient Medications  Scheduled Meds: . aspirin EC  325 mg Oral QPM  . colesevelam  1,250 mg Oral BID WC  . fenofibrate  160 mg Oral Daily  . HYDROmorphone      . insulin aspart  0-9 Units Subcutaneous TID WC  . insulin aspart  3 Units Subcutaneous TID WC  . insulin glargine  8 Units Subcutaneous Daily   . ipratropium-albuterol  3 mL Nebulization TID  . magnesium oxide  400 mg Oral BID  . metoprolol tartrate  12.5 mg Oral BID  . piperacillin-tazobactam (ZOSYN)  IV  3.375 g Intravenous Q8H  . rosuvastatin  5 mg Oral QPM  . vancomycin  1,000 mg Intravenous Q8H   Continuous Infusions:  PRN Meds:.acetaminophen **OR** acetaminophen, haloperidol lactate, HYDROmorphone (DILAUDID) injection, ipratropium-albuterol, ondansetron **OR** ondansetron (ZOFRAN) IV, senna-docusate, zolpidem  Micro Results Recent Results (from the past 240 hour(s))  Blood Culture (routine x 2)     Status: None (Preliminary result)   Collection Time: 03/14/16 10:34 AM  Result Value Ref Range Status   Specimen Description BLOOD LEFT ANTECUBITAL  Final   Special Requests BOTTLES DRAWN AEROBIC AND ANAEROBIC 5ML  Final  Culture   Final    NO GROWTH 4 DAYS Performed at [facility]    Report Status PENDING  Incomplete  Blood Culture (routine x 2)     Status: None (Preliminary result)   Collection Time: 03/14/16 10:46 AM  Result Value Ref Range Status   Specimen Description BLOOD LEFT HAND  Final   Special Requests BOTTLES DRAWN AEROBIC AND ANAEROBIC 5ML  Final   Culture   Final    NO GROWTH 4 DAYS Performed at [facility]    Report Status PENDING  Incomplete  Anaerobic culture     Status: None (Preliminary result)   Collection Time: 03/14/16  3:01 PM  Result Value Ref Range Status   Specimen Description   Final    ABSCESS IRRIGATION AND DEBRIDEMENT SCROTAL ABCESS AND CYTOSCOPY   Special Requests NONE  Final   Culture   Final    HOLDING FOR POSSIBLE ANAEROBE Performed at [facility]    Report Status PENDING  Incomplete  Aerobic Culture (superficial specimen)     Status: None   Collection Time: 03/14/16  3:01 PM  Result Value Ref Range Status   Specimen Description   Final    ABSCESS IRRIGATION AND DEBRIDEMENT SCROTAL ABCESS AND CYTOSCOPY   Special Requests NONE  Final   Gram Stain    Final    FEW WBC PRESENT, PREDOMINANTLY MONONUCLEAR ABUNDANT GRAM NEGATIVE RODS FEW GRAM POSITIVE COCCI IN PAIRS FEW GRAM POSITIVE RODS    Culture   Final    NORMAL SKIN FLORA Performed at [facility]    Report Status 03/17/2016 FINAL  Final  Fungus Stain     Status: None   Collection Time: 03/14/16  3:01 PM  Result Value Ref Range Status   FUNGUS STAIN Final report  Final    Comment: (NOTE) Performed At: [facility] [address] [place], [place] [id number] Lindon Romp MD [id number]    Fungal Source ABSCESS  Final    Comment: IRRIGATION AND DEBRIDEMENT SCROTAL ABCESS AND CYTOSCOPY  Fungal Stain reflex     Status: None   Collection Time: 03/14/16  3:01 PM  Result Value Ref Range Status   Fungal stain result 1 Comment  Final    Comment: (NOTE) KOH/Calcofluor preparation:  no fungus observed. Performed At: [facility] [place], [place] [id number] Lindon Romp MD [id number]   MRSA PCR Screening     Status: None   Collection Time: 03/14/16  5:32 PM  Result Value Ref Range Status   MRSA by PCR NEGATIVE NEGATIVE Final    Comment:        The GeneXpert MRSA Assay (FDA approved for NASAL specimens only), is one component of a comprehensive MRSA colonization surveillance program. It is not intended to diagnose MRSA infection nor to guide or monitor treatment for MRSA infections.   Urine culture     Status: None   Collection Time: 03/14/16  8:02 PM  Result Value Ref Range Status   Specimen Description URINE, CATHETERIZED  Final   Special Requests NONE  Final   Culture NO GROWTH Performed at [facility]   Final   Report Status 03/16/2016 FINAL  Final    Radiology Reports Ct Abdomen Pelvis W Contrast  Result Date: 03/14/2016 CLINICAL DATA:  65 year old male with diabetes mellitus and scrotal infection with fever and erythema spreading to the right buttock. Clinical concern for Fournier's gangrene. EXAM: CT  ABDOMEN AND PELVIS WITH CONTRAST  TECHNIQUE: Multidetector CT imaging of the abdomen and pelvis was performed using the standard protocol following bolus administration of intravenous contrast. CONTRAST:  166mL ISOVUE-300 IOPAMIDOL (ISOVUE-300) INJECTION 61% COMPARISON:  None. FINDINGS: Lower chest: No significant pulmonary nodules or acute consolidative airspace disease. Centrilobular emphysema at the lung bases. Coronary atherosclerosis. Hepatobiliary: Diffuse hepatic steatosis. No liver mass. Normal gallbladder with no radiopaque cholelithiasis. No biliary ductal dilatation. Pancreas: Normal, with no mass or duct dilation. Spleen: Normal size. No mass. Adrenals/Urinary Tract: Normal adrenals. No hydronephrosis. Hypodense 0.6 cm renal cortical lesion in the lateral interpolar left kidney, too small to characterize, for which no further follow-up is required. No additional renal lesions. Normal bladder. Stomach/Bowel: Grossly normal stomach. Normal caliber small bowel with no small bowel wall thickening. Normal appendix. Normal large bowel with no diverticulosis, large bowel wall thickening or pericolonic fat stranding. Vascular/Lymphatic: Atherosclerotic nonaneurysmal abdominal aorta. Patent portal, splenic, hepatic and renal veins. Bilateral inguinal adenopathy measuring up to 2.1 cm on the right (series 2/ image 78) and 2.0 cm on the left (series 2/ image 77). No additional pathologically enlarged abdominopelvic nodes. Reproductive: Normal size prostate with nonspecific internal prostatic calcifications. Symmetric normal size seminal vesicles. Large right hydrocele measuring 7.5 x 6.4 cm (series 3/image 18). No left hydrocele. Severe thickening of the skin and subcutaneous soft tissues of the entire scrotum. Skin thickening, subcutaneous fat stranding and extensive subcutaneous emphysema throughout the perineum and bilateral medial buttocks, extending into the right ischial rectal fossa and deep right upper  scrotal soft tissues. Subcutaneous abscess in the right perineum measuring 4.1 x 2.7 x 3.1 cm (series 2/image 99), demonstrating air-fluid level and thick enhancing wall. No additional focal drainable fluid collections in the abdomen or pelvis. Other: No pneumoperitoneum, ascites or focal fluid collection. Musculoskeletal: No aggressive appearing focal osseous lesions. Mild-to-moderate thoracolumbar spondylosis. IMPRESSION: 1. CT findings consistent with Fournier's gangrene, with extensive skin thickening, subcutaneous fat stranding and subcutaneous emphysema throughout the perineum, bilateral medial buttocks, right ischiorectal fossa and scrotum. 2. Subcutaneous abscess in the right perineum. Large right hydrocele. No ascites or intraperitoneal gas or fluid collections. 3. Additional findings include aortic atherosclerosis, coronary atherosclerosis, centrilobular emphysema and diffuse hepatic steatosis. These results were called by telephone at the time of interpretation on 03/14/2016 at 12:29 pm to Dr. Addison Lank , who verbally acknowledged these results. Electronically Signed   By: [name] M.D.   On: 03/14/2016 12:32   Dg Chest Port 1 View  Result Date: 03/16/2016 CLINICAL DATA:  Post I&D today now with confusion, hypoxia and hypotension. EXAM: PORTABLE CHEST 1 VIEW COMPARISON:  03/12/2016; 06/27/2010 FINDINGS: Examination is degraded secondary to exclusion of the left costophrenic angle. Grossly unchanged enlarged cardiac silhouette and mediastinal contours given reduced lung volumes. Atherosclerotic plaque within the thoracic aorta. Pulmonary venous congestion without frank evidence of edema. Worsening bilateral infrahilar opacities favored to represent atelectasis. No discrete focal airspace opacities. No definite pleural effusion, though note, the left costophrenic angle is excluded from view. No pneumothorax. No acute osseus abnormalities. IMPRESSION: Cardiomegaly and pulmonary venous congestion  without definitive superimposed acute cardiopulmonary disease on these degraded AP portable examinations. Further evaluation with a PA and lateral chest radiographs may be obtained as clinically indicated. Electronically Signed   By: Sandi Mariscal M.D.   On: 03/16/2016 14:57   Dg Chest Port 1 View  Result Date: 03/14/2016 CLINICAL DATA:  Sepsis.  Smoker. EXAM: PORTABLE CHEST 1 VIEW COMPARISON:  06/27/2010 . FINDINGS: Mediastinum and hilar structures normal. Mild cardiomegaly.  Mild right base infiltrate cannot be excluded. No pleural effusion or pneumothorax . No acute bony abnormality. IMPRESSION: 1. Mild right base infiltrate cannot be excluded. 2. Mild cardiomegaly. Electronically Signed   By: Marcello Moores  Register   On: 03/14/2016 12:40    Time Spent in minutes  35   Louellen Molder M.D on 03/19/2016 at 1:11 PM  Between 7am to 7pm - Pager - [phone]  After 7pm go to [email or web address] - password [facility]  [facility] -  Office  [phone]

## 2016-03-20 LAB — CBC
HEMATOCRIT: 35.1 % — AB (ref 39.0–52.0)
HEMOGLOBIN: 11.2 g/dL — AB (ref 13.0–17.0)
MCH: 30.4 pg (ref 26.0–34.0)
MCHC: 31.9 g/dL (ref 30.0–36.0)
MCV: 95.4 fL (ref 78.0–100.0)
Platelets: 386 10*3/uL (ref 150–400)
RBC: 3.68 MIL/uL — ABNORMAL LOW (ref 4.22–5.81)
RDW: 14.3 % (ref 11.5–15.5)
WBC: 6.9 10*3/uL (ref 4.0–10.5)

## 2016-03-20 LAB — GLUCOSE, CAPILLARY
GLUCOSE-CAPILLARY: 162 mg/dL — AB (ref 65–99)
GLUCOSE-CAPILLARY: 150 mg/dL — AB (ref 65–99)
Glucose-Capillary: 122 mg/dL — ABNORMAL HIGH (ref 65–99)
Glucose-Capillary: 142 mg/dL — ABNORMAL HIGH (ref 65–99)

## 2016-03-20 LAB — CBC AND DIFFERENTIAL: WBC: 6.9 10*3/mL

## 2016-03-20 MED ORDER — HYDROMORPHONE HCL 1 MG/ML IJ SOLN
1.0000 mg | Freq: Two times a day (BID) | INTRAMUSCULAR | Status: DC
  Administered 2016-03-21 – 2016-03-22 (×2): 1 mg via INTRAVENOUS
  Filled 2016-03-20 (×4): qty 1

## 2016-03-20 MED ORDER — SULFAMETHOXAZOLE-TRIMETHOPRIM 800-160 MG PO TABS
1.0000 | ORAL_TABLET | Freq: Two times a day (BID) | ORAL | Status: DC
  Administered 2016-03-20 – 2016-03-25 (×11): 1 via ORAL
  Filled 2016-03-20 (×12): qty 1

## 2016-03-20 NOTE — Consult Note (Signed)
Urology Consult Note   Requesting Attending Physician:  Louellen Molder, MD Service Providing Consult: Urology Consulting Attending: Matilde Sprang, MD  Assessment:  Patient is a 65 y.o. 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 S/p OR debridement of necrotic tissue 9/29, 10/1, and 10/3 with aspiration of right hydrocele on 10/3.   Interval: AFVSS. 950cc total UOP recorded. WBC 6.9. Hb 11.2. Mixed anaerobes present; GNRs, GPC, Gram positive rods. Blood cultures NGTD. Dressing changed with general surgery today.  Recommendations: 1. Antibiotics per medicine. Would appear appropriate to transition to PO trial  2. Agree with general surgery's plan to attempt to get to BID dressing changes by tomorrow 10/6 3. Appreciate medicine's involvement in this patient's care, DM control/CAD, etc. 4. Continue Foley catheter to straight drain  Thank you for this consult. Please contact the urology consult pager with any further questions/concerns. Sharmaine Base, MD Urology Surgical Resident  Subjective: Moved to floor. Tolerated dressing change at bedside yesterday, albeit with some significant pain. Current pain is 8/10, worse with ambulation/sitting. Annoyed by situation and care on the floor. No n/v.   Objective   Vital signs in last 24 hours: BP 121/77 (BP Location: Left Arm)   Pulse 70   Temp 98.4 F (36.9 C) (Oral)   Resp 18   Ht 6' (1.829 m)   Wt 190 lb (86.2 kg)   SpO2 92%   BMI 25.77 kg/m   Intake/Output last 3 shifts: I/O last 3 completed shifts: In: 23 [P.O.:840; Other:3050; IV Piggyback:1000] Out: 2600 [Urine:2600]  Physical Exam General: NAD, A&O HEENT: [place]/AT, EOMI, MMM Pulmonary: Normal work of breathing Cardiovascular: Regular rate & rhythm, HDS, adequate peripheral perfusion Abdomen: soft, NTTP, nondistended, no suprapubic fullness or tenderness GU: Foley draining yellow urine, mesh underwear in place with no  significant soak through, overlying gauze in wound bed appears dry with no soak through, scrotal & perineal dressing with minimal SS soakthrough; dressing changed revealing healthy appearing red/pink granulation tissue with minor fibrinous exudate, there are a couple of sutures in the perineal right & left buttock skin flaps re-approximating tissue, no significant foul smell or obvious areas of necrosis Extremities: warm and well perfused, no edema   Most Recent Labs: Lab Results  Component Value Date   WBC 6.9 03/20/2016   HGB 11.2 (L) 03/20/2016   HCT 35.1 (L) 03/20/2016   PLT 386 03/20/2016    Lab Results  Component Value Date   NA 137 03/19/2016   K 4.1 03/19/2016   CL 98 (L) 03/19/2016   CO2 35 (H) 03/19/2016   BUN 6 03/19/2016   CREATININE 0.64 03/19/2016   CALCIUM 8.3 (L) 03/19/2016   MG 1.6 (L) 03/15/2016   PHOS 2.9 03/15/2016    Lab Results  Component Value Date   ALKPHOS 39 03/15/2016   BILITOT 1.2 03/15/2016   PROT 4.3 (L) 03/15/2016   ALBUMIN 1.9 (L) 03/15/2016   ALT 10 (L) 03/15/2016   AST 22 03/15/2016    No results found for: INR, APTT   Urine Culture: No growth   IMAGING: No new

## 2016-03-20 NOTE — Progress Notes (Signed)
PT Cancellation Note  Patient Details Name: Jonathan Tucker MRN: [id number] DOB: [birth year]   Cancelled Treatment:    Reason Eval/Treat Not Completed: Pain limiting ability to participate, per MD note, patient declining PT eval today, will check back 10/6.   Claretha Cooper 03/20/2016, 2:21 PM Tresa Endo PT [phone]

## 2016-03-20 NOTE — Care Management Important Message (Signed)
Important Message  Patient Details  Name: Jonathan Tucker MRN: [id number] Date of Birth: 07/06/1950   Medicare Important Message Given:  Yes    Camillo Flaming 03/20/2016, 9:09 AMImportant Message  Patient Details  Name: Jonathan Tucker MRN: [id number] Date of Birth: 17-Jan-1951   Medicare Important Message Given:  Yes    Camillo Flaming 03/20/2016, 9:09 AM

## 2016-03-20 NOTE — Progress Notes (Signed)
PROGRESS NOTE                                                                                                                                                                                                             Patient Demographics:    Jonathan Tucker, is a 66 y.o. male, DOB - Oct 21, 1950, [id number]  Admit date - 03/14/2016   Admitting Physician Murlean Iba, MD  Outpatient Primary MD for the patient is [name],[name], PA-C  LOS - 6  Outpatient Specialists:none  Chief Complaint  Patient presents with  . Groin Swelling       Brief Narrative   65 year old male with type 2 diabetes mellitus, dyslipidemia, hypertension was referred for admission by urology with concern for Fournier's gangrene. Patient was septic in the ED with hypotension (BP 62/29 mmHg) cardiac to 110s and tachypnea. He was afebrile. Blood work showed leukocytosis with WBC of 17, sodium of 124 and lactic acid of 3. CT scan was concerning for Fournier's gangrene. Patient seen by surgery and underwent I&D of the perineal and scrotal abscess on 9/29 and 10/1 followed by debridement of the gangrene and aspiration of hydrocele fluid with partial closure of the gangrene on 10/3.   Subjective:   Still has pain worse with dressing.   Assessment  & Plan :    Principal Problem: Severe sepsis secondary to Fournier's gangrene Underwent multiple debridements by surgery and urology.  Blood and scrotal abscess culture negative. Switch abx to bactrim. Treat for  at least 2 weeks.  Pain control with hydrocodone and Dilaudid when necessary. Dressing per surgery. Continue Foley catheter.  Active Problems: Acute hypoxic respiratory failure (HCC) Became hypoxic after I&D on 10/1, chest x-ray showing pulmonary vascular congestion. Respiratory status currently stable. 2-D echo on 10/3 showing EF of 55 and 60% with no wall motion abnormality. Currently  stable.  New onset A. fib with RVR Suspected this is associated with sepsis. Heart rate well controlled with low-dose beta blocker. Continue aspirin. CHADS2vasc score of 1, no need for anticoagulation. 2-D echo reviewed. Appreciate cardiology evaluation.  Hydrocele of scrotum Aspirated on 10/3.      Uncontrolled diabetes mellitus ([place]) Worsened with sepsis. A1c of 6.5. Now stable on low dose Lantus with pre-meal aspart.    Hyponatremia Secondary to dehydration and sepsis. Now resolved.  Acute kidney injury ([place])  Secondary to sepsis. Resolved.    Acute blood loss anemia Currently stable.  Dyslipidemia Continue home medications  Essential hypertension Continue beta blocker.   Pt refusing PT eval today due to pain, will request for tomorrow      Code Status : Full code  Family Communication  : Wife at bedside  Disposition Plan  : Home once clinically improved  Barriers For Discharge : active symptoms. IV pain meds. PT eval pending  Consults  :   Surgery Neurology Cardiology  Procedures  :  I&D of the perineal and scrotal abscess on 9/29, 10/1 and 10/4  DVT Prophylaxis  : Lovenox  Lab Results  Component Value Date   PLT 386 03/20/2016    Antibiotics  :   Anti-infectives    Start     Dose/Rate Route Frequency Ordered Stop   03/20/16 1300  sulfamethoxazole-trimethoprim (BACTRIM DS,SEPTRA DS) 800-160 MG per tablet 1 tablet     1 tablet Oral Every 12 hours 03/20/16 1248     03/16/16 1600  vancomycin (VANCOCIN) IVPB 1000 mg/200 mL premix  Status:  Discontinued     1,000 mg 200 mL/hr over 60 Minutes Intravenous Every 8 hours 03/16/16 1131 03/20/16 1248   03/14/16 2200  vancomycin (VANCOCIN) IVPB 750 mg/150 ml premix  Status:  Discontinued     750 mg 150 mL/hr over 60 Minutes Intravenous Every 12 hours 03/14/16 1220 03/16/16 1131   03/14/16 2000  piperacillin-tazobactam (ZOSYN) IVPB 3.375 g  Status:  Discontinued     3.375 g 12.5 mL/hr over 240 Minutes  Intravenous Every 8 hours 03/14/16 1218 03/20/16 1248   03/14/16 1520  polymyxin B 500,000 Units, bacitracin 50,000 Units in sodium chloride irrigation 0.9 % 500 mL irrigation  Status:  Discontinued       As needed 03/14/16 1521 03/14/16 1603   03/14/16 1045  vancomycin (VANCOCIN) IVPB 1000 mg/200 mL premix     1,000 mg 200 mL/hr over 60 Minutes Intravenous  Once 03/14/16 1033 03/14/16 1348   03/14/16 1030  clindamycin (CLEOCIN) IVPB 900 mg     900 mg 100 mL/hr over 30 Minutes Intravenous  Once 03/14/16 1019 03/14/16 1246   03/14/16 1030  piperacillin-tazobactam (ZOSYN) IVPB 3.375 g     3.375 g 100 mL/hr over 30 Minutes Intravenous  Once 03/14/16 1027 03/14/16 1304        Objective:   Vitals:   03/19/16 1301 03/19/16 2052 03/20/16 0432 03/20/16 1100  BP: 125/78 124/81 121/77   Pulse: 70 76 70   Resp: 18 18 18    Temp: 97.4 F (36.3 C) 98.3 F (36.8 C) 98.4 F (36.9 C)   TempSrc: Oral Oral Oral   SpO2: 97% 100% 92% 96%  Weight:      Height:        Wt Readings from Last 3 Encounters:  03/14/16 86.2 kg (190 lb)     Intake/Output Summary (Last 24 hours) at 03/20/16 1248 Last data filed at 03/20/16 0756  Gross per 24 hour  Intake             4580 ml  Output              650 ml  Net             3930 ml     Physical Exam  Gen: not in distress HEENT: moist mucosa, supple neck Chest: clear b/l, no added sounds CVS: N S1&S2, no murmurs GI: soft, NT, ND, BS+, Dressing over scrotum,  Foley catheter+ Musculoskeletal: warm, no edema     Data Review:    CBC  Recent Labs Lab 03/14/16 1034 03/15/16 0122 03/16/16 0317 03/17/16 0306 03/18/16 1900 03/19/16 0320 03/20/16 0502  WBC 17.0* 14.2* 14.5* 12.2* 12.0* 10.0 6.9  HGB 13.9 10.5* 10.9* 10.6* 12.4* 11.4* 11.2*  HCT 38.1* 30.5* 32.0* 31.7* 38.3* 35.0* 35.1*  PLT 169 181 228 300 377 407* 386  MCV 86.0 90.0 91.4 92.4 94.3 94.9 95.4  MCH 31.4 31.0 31.1 30.9 30.5 30.9 30.4  MCHC 36.5* 34.4 34.1 33.4 32.4 32.6 31.9   RDW 13.2 13.8 14.1 14.4 14.4 14.5 14.3  LYMPHSABS 0.7 0.7  --   --   --   --   --   MONOABS 1.4* 1.0  --   --   --   --   --   EOSABS 0.0 0.0  --   --   --   --   --   BASOSABS 0.0 0.1  --   --   --   --   --     Chemistries   Recent Labs Lab 03/14/16 1034 03/15/16 0122 03/16/16 0317 03/18/16 0314 03/18/16 1900 03/19/16 0320  NA 124* 127* 135 137 139 137  K 3.7 4.2 3.7 3.9 4.2 4.1  CL 88* 100* 104 101 101 98*  CO2 24 21* 24 31 32 35*  GLUCOSE 208* 167* 130* 153* 145* 154*  BUN 27* 22* 11 6 6 6   CREATININE 1.34* 1.08 0.67 0.74 0.62 0.64  CALCIUM 8.5* 6.7* 7.4* 8.1* 8.4* 8.3*  MG  --  1.6*  --   --   --   --   AST 20 22  --   --   --   --   ALT 16* 10*  --   --   --   --   ALKPHOS 58 39  --   --   --   --   BILITOT 0.9 1.2  --   --   --   --    ------------------------------------------------------------------------------------------------------------------ No results for input(s): CHOL, HDL, LDLCALC, TRIG, CHOLHDL, LDLDIRECT in the last 72 hours.  Lab Results  Component Value Date   HGBA1C 6.5 (H) 03/14/2016   ------------------------------------------------------------------------------------------------------------------  Recent Labs  03/18/16 0314  TSH 2.964   ------------------------------------------------------------------------------------------------------------------ No results for input(s): VITAMINB12, FOLATE, FERRITIN, TIBC, IRON, RETICCTPCT in the last 72 hours.  Coagulation profile No results for input(s): INR, PROTIME in the last 168 hours.  No results for input(s): DDIMER in the last 72 hours.  Cardiac Enzymes No results for input(s): CKMB, TROPONINI, MYOGLOBIN in the last 168 hours.  Invalid input(s): CK ------------------------------------------------------------------------------------------------------------------ No results found for: BNP  Inpatient Medications  Scheduled Meds: . aspirin EC  325 mg Oral QPM  . colesevelam  1,250 mg  Oral BID WC  . fenofibrate  160 mg Oral Daily  . insulin aspart  0-9 Units Subcutaneous TID WC  . insulin aspart  3 Units Subcutaneous TID WC  . insulin glargine  8 Units Subcutaneous Daily  . metoprolol tartrate  12.5 mg Oral BID  . rosuvastatin  5 mg Oral QPM  . sulfamethoxazole-trimethoprim  1 tablet Oral Q12H   Continuous Infusions:  PRN Meds:.acetaminophen **OR** acetaminophen, haloperidol lactate, HYDROcodone-acetaminophen, HYDROmorphone (DILAUDID) injection, ipratropium-albuterol, ondansetron **OR** ondansetron (ZOFRAN) IV, senna-docusate, zolpidem  Micro Results Recent Results (from the past 240 hour(s))  Blood Culture (routine x 2)     Status: None   Collection Time: 03/14/16 10:34 AM  Result Value Ref Range Status   Specimen Description BLOOD LEFT ANTECUBITAL  Final   Special Requests BOTTLES DRAWN AEROBIC AND ANAEROBIC 5ML  Final   Culture   Final    NO GROWTH 5 DAYS Performed at [facility]    Report Status 03/19/2016 FINAL  Final  Blood Culture (routine x 2)     Status: None   Collection Time: 03/14/16 10:46 AM  Result Value Ref Range Status   Specimen Description BLOOD LEFT HAND  Final   Special Requests BOTTLES DRAWN AEROBIC AND ANAEROBIC 5ML  Final   Culture   Final    NO GROWTH 5 DAYS Performed at [facility]    Report Status 03/19/2016 FINAL  Final  Anaerobic culture     Status: None   Collection Time: 03/14/16  3:01 PM  Result Value Ref Range Status   Specimen Description   Final    ABSCESS IRRIGATION AND DEBRIDEMENT SCROTAL ABCESS AND CYTOSCOPY   Special Requests NONE  Final   Culture   Final    MIXED ANAEROBIC FLORA PRESENT.  CALL LAB IF FURTHER IID REQUIRED.   Report Status 03/19/2016 FINAL  Final  Aerobic Culture (superficial specimen)     Status: None   Collection Time: 03/14/16  3:01 PM  Result Value Ref Range Status   Specimen Description   Final    ABSCESS IRRIGATION AND DEBRIDEMENT SCROTAL ABCESS AND CYTOSCOPY   Special  Requests NONE  Final   Gram Stain   Final    FEW WBC PRESENT, PREDOMINANTLY MONONUCLEAR ABUNDANT GRAM NEGATIVE RODS FEW GRAM POSITIVE COCCI IN PAIRS FEW GRAM POSITIVE RODS    Culture   Final    NORMAL SKIN FLORA Performed at [facility]    Report Status 03/17/2016 FINAL  Final  Fungus Stain     Status: None   Collection Time: 03/14/16  3:01 PM  Result Value Ref Range Status   FUNGUS STAIN Final report  Final    Comment: (NOTE) Performed At: [facility] [place], [place] [id number] Lindon Romp MD [id number]    Fungal Source ABSCESS  Final    Comment: IRRIGATION AND DEBRIDEMENT SCROTAL ABCESS AND CYTOSCOPY  Fungal Stain reflex     Status: None   Collection Time: 03/14/16  3:01 PM  Result Value Ref Range Status   Fungal stain result 1 Comment  Final    Comment: (NOTE) KOH/Calcofluor preparation:  no fungus observed. Performed At: [facility] [place], [place] [id number] Lindon Romp MD [id number]   MRSA PCR Screening     Status: None   Collection Time: 03/14/16  5:32 PM  Result Value Ref Range Status   MRSA by PCR NEGATIVE NEGATIVE Final    Comment:        The GeneXpert MRSA Assay (FDA approved for NASAL specimens only), is one component of a comprehensive MRSA colonization surveillance program. It is not intended to diagnose MRSA infection nor to guide or monitor treatment for MRSA infections.   Urine culture     Status: None   Collection Time: 03/14/16  8:02 PM  Result Value Ref Range Status   Specimen Description URINE, CATHETERIZED  Final   Special Requests NONE  Final   Culture NO GROWTH Performed at [facility]   Final   Report Status 03/16/2016 FINAL  Final    Radiology Reports Ct Abdomen Pelvis W Contrast  Result Date: 03/14/2016 CLINICAL DATA:  65 year old male  with diabetes mellitus and scrotal infection with fever and erythema spreading to the right buttock. Clinical  concern for Fournier's gangrene. EXAM: CT ABDOMEN AND PELVIS WITH CONTRAST TECHNIQUE: Multidetector CT imaging of the abdomen and pelvis was performed using the standard protocol following bolus administration of intravenous contrast. CONTRAST:  131mL ISOVUE-300 IOPAMIDOL (ISOVUE-300) INJECTION 61% COMPARISON:  None. FINDINGS: Lower chest: No significant pulmonary nodules or acute consolidative airspace disease. Centrilobular emphysema at the lung bases. Coronary atherosclerosis. Hepatobiliary: Diffuse hepatic steatosis. No liver mass. Normal gallbladder with no radiopaque cholelithiasis. No biliary ductal dilatation. Pancreas: Normal, with no mass or duct dilation. Spleen: Normal size. No mass. Adrenals/Urinary Tract: Normal adrenals. No hydronephrosis. Hypodense 0.6 cm renal cortical lesion in the lateral interpolar left kidney, too small to characterize, for which no further follow-up is required. No additional renal lesions. Normal bladder. Stomach/Bowel: Grossly normal stomach. Normal caliber small bowel with no small bowel wall thickening. Normal appendix. Normal large bowel with no diverticulosis, large bowel wall thickening or pericolonic fat stranding. Vascular/Lymphatic: Atherosclerotic nonaneurysmal abdominal aorta. Patent portal, splenic, hepatic and renal veins. Bilateral inguinal adenopathy measuring up to 2.1 cm on the right (series 2/ image 78) and 2.0 cm on the left (series 2/ image 77). No additional pathologically enlarged abdominopelvic nodes. Reproductive: Normal size prostate with nonspecific internal prostatic calcifications. Symmetric normal size seminal vesicles. Large right hydrocele measuring 7.5 x 6.4 cm (series 3/image 18). No left hydrocele. Severe thickening of the skin and subcutaneous soft tissues of the entire scrotum. Skin thickening, subcutaneous fat stranding and extensive subcutaneous emphysema throughout the perineum and bilateral medial buttocks, extending into the right  ischial rectal fossa and deep right upper scrotal soft tissues. Subcutaneous abscess in the right perineum measuring 4.1 x 2.7 x 3.1 cm (series 2/image 99), demonstrating air-fluid level and thick enhancing wall. No additional focal drainable fluid collections in the abdomen or pelvis. Other: No pneumoperitoneum, ascites or focal fluid collection. Musculoskeletal: No aggressive appearing focal osseous lesions. Mild-to-moderate thoracolumbar spondylosis. IMPRESSION: 1. CT findings consistent with Fournier's gangrene, with extensive skin thickening, subcutaneous fat stranding and subcutaneous emphysema throughout the perineum, bilateral medial buttocks, right ischiorectal fossa and scrotum. 2. Subcutaneous abscess in the right perineum. Large right hydrocele. No ascites or intraperitoneal gas or fluid collections. 3. Additional findings include aortic atherosclerosis, coronary atherosclerosis, centrilobular emphysema and diffuse hepatic steatosis. These results were called by telephone at the time of interpretation on 03/14/2016 at 12:29 pm to Dr. Addison Lank , who verbally acknowledged these results. Electronically Signed   By: [name] M.D.   On: 03/14/2016 12:32   Dg Chest Port 1 View  Result Date: 03/16/2016 CLINICAL DATA:  Post I&D today now with confusion, hypoxia and hypotension. EXAM: PORTABLE CHEST 1 VIEW COMPARISON:  03/12/2016; 06/27/2010 FINDINGS: Examination is degraded secondary to exclusion of the left costophrenic angle. Grossly unchanged enlarged cardiac silhouette and mediastinal contours given reduced lung volumes. Atherosclerotic plaque within the thoracic aorta. Pulmonary venous congestion without frank evidence of edema. Worsening bilateral infrahilar opacities favored to represent atelectasis. No discrete focal airspace opacities. No definite pleural effusion, though note, the left costophrenic angle is excluded from view. No pneumothorax. No acute osseus abnormalities. IMPRESSION:  Cardiomegaly and pulmonary venous congestion without definitive superimposed acute cardiopulmonary disease on these degraded AP portable examinations. Further evaluation with a PA and lateral chest radiographs may be obtained as clinically indicated. Electronically Signed   By: Sandi Mariscal M.D.   On: 03/16/2016 14:57   Dg Chest Port 1  View  Result Date: 03/14/2016 CLINICAL DATA:  Sepsis.  Smoker. EXAM: PORTABLE CHEST 1 VIEW COMPARISON:  06/27/2010 . FINDINGS: Mediastinum and hilar structures normal. Mild cardiomegaly. Mild right base infiltrate cannot be excluded. No pleural effusion or pneumothorax . No acute bony abnormality. IMPRESSION: 1. Mild right base infiltrate cannot be excluded. 2. Mild cardiomegaly. Electronically Signed   By: Marcello Moores  Register   On: 03/14/2016 12:40    Time Spent in minutes  25   Louellen Molder M.D on 03/20/2016 at 12:48 PM  Between 7am to 7pm - Pager - [phone]  After 7pm go to [email or web address] - password [facility]  [facility] -  Office  [phone]

## 2016-03-20 NOTE — Progress Notes (Signed)
[facility] Progress Note  2 Days Post-Op  Subjective: NAE overnight, VSS. Rates his pain as 8/10. Tolerating diet. Last BM was 2-3 days ago and was loose, non-bloody.   Objective: Vital signs in last 24 hours: Temp:  [97.4 F (36.3 C)-98.4 F (36.9 C)] 98.4 F (36.9 C) (10/05 0432) Pulse Rate:  [70-78] 70 (10/05 0432) Resp:  [12-19] 18 (10/05 0432) BP: (110-152)/(52-81) 121/77 (10/05 0432) SpO2:  [92 %-100 %] 92 % (10/05 0432) Last BM Date: 03/18/16  Intake/Output from previous day: 10/04 0701 - 10/05 0700 In: 4590 [P.O.:840; IV Piggyback:700] Out: 950 [Urine:950] Intake/Output this shift: No intake/output data recorded.  PE: Gen:  Alert, NAD, pleasant and cooperative Card:  RRR, no M/G/R heard Pulm:  CTABL Abd:  GU: packing removed and dressing changed without complication. Tissue is pink with some yellow slough at wound edges and deep aspects of wound, no necrotic tissue appreciated. 1 saline soaked kerlix packed into perineum/scrotum. 2 small pieces of saline kerlix packed inferior to gluteal sutures.  Lab Results:   Recent Labs  03/19/16 0320 03/20/16 0502  WBC 10.0 6.9  HGB 11.4* 11.2*  HCT 35.0* 35.1*  PLT 407* 386   BMET  Recent Labs  03/18/16 1900 03/19/16 0320  NA 139 137  K 4.2 4.1  CL 101 98*  CO2 32 35*  GLUCOSE 145* 154*  BUN 6 6  CREATININE 0.62 0.64  CALCIUM 8.4* 8.3*   CMP     Component Value Date/Time   NA 137 03/19/2016 0320   K 4.1 03/19/2016 0320   CL 98 (L) 03/19/2016 0320   CO2 35 (H) 03/19/2016 0320   GLUCOSE 154 (H) 03/19/2016 0320   BUN 6 03/19/2016 0320   CREATININE 0.64 03/19/2016 0320   CALCIUM 8.3 (L) 03/19/2016 0320   PROT 4.3 (L) 03/15/2016 0122   ALBUMIN 1.9 (L) 03/15/2016 0122   AST 22 03/15/2016 0122   ALT 10 (L) 03/15/2016 0122   ALKPHOS 39 03/15/2016 0122   BILITOT 1.2 03/15/2016 0122   GFRNONAA >60 03/19/2016 0320   GFRAA >60 03/19/2016 0320   Anti-infectives: Anti-infectives    Start      Dose/Rate Route Frequency Ordered Stop   03/16/16 1600  vancomycin (VANCOCIN) IVPB 1000 mg/200 mL premix     1,000 mg 200 mL/hr over 60 Minutes Intravenous Every 8 hours 03/16/16 1131     03/14/16 2200  vancomycin (VANCOCIN) IVPB 750 mg/150 ml premix  Status:  Discontinued     750 mg 150 mL/hr over 60 Minutes Intravenous Every 12 hours 03/14/16 1220 03/16/16 1131   03/14/16 2000  piperacillin-tazobactam (ZOSYN) IVPB 3.375 g     3.375 g 12.5 mL/hr over 240 Minutes Intravenous Every 8 hours 03/14/16 1218     03/14/16 1520  polymyxin B 500,000 Units, bacitracin 50,000 Units in sodium chloride irrigation 0.9 % 500 mL irrigation  Status:  Discontinued       As needed 03/14/16 1521 03/14/16 1603   03/14/16 1045  vancomycin (VANCOCIN) IVPB 1000 mg/200 mL premix     1,000 mg 200 mL/hr over 60 Minutes Intravenous  Once 03/14/16 1033 03/14/16 1348   03/14/16 1030  clindamycin (CLEOCIN) IVPB 900 mg     900 mg 100 mL/hr over 30 Minutes Intravenous  Once 03/14/16 1019 03/14/16 1246   03/14/16 1030  piperacillin-tazobactam (ZOSYN) IVPB 3.375 g     3.375 g 100 mL/hr over 30 Minutes Intravenous  Once 03/14/16 1027 03/14/16 1304  Assessment/Plan Assessment/Plan Fournier's Gangrene    S/p Irrigation and debridement 03/15/16, Dr. Harlow Asa and Dr. Lorra Hals    Dressing change in [place], limited debridement 03/16/16, Dr. Harlow Asa and Dr. Lorra Hals    Debridement and partial closure perineal wound 03/18/16. Dr. Leighton Ruff and Dr. Lorra Hals    Bedside dressing changes beginning 03/19/16   FEN: Regular  ID: Vancomycin, Zosyn  Plan: continue pre-medicating with 1-2 mg IV dilaudid and transition to BID wet-to-dry dressing changes tomorrow.  PT eval   LOS: 6 days    [facility] 03/20/2016, 7:44 AM Pager: [phone] Consults: [phone] Mon-Fri 7:00 am-4:30 pm Sat-Sun 7:00 am-11:30 am

## 2016-03-21 LAB — GLUCOSE, CAPILLARY
GLUCOSE-CAPILLARY: 123 mg/dL — AB (ref 65–99)
GLUCOSE-CAPILLARY: 110 mg/dL — AB (ref 65–99)
GLUCOSE-CAPILLARY: 178 mg/dL — AB (ref 65–99)
Glucose-Capillary: 125 mg/dL — ABNORMAL HIGH (ref 65–99)

## 2016-03-21 MED ORDER — METOPROLOL TARTRATE 50 MG PO TABS
50.0000 mg | ORAL_TABLET | Freq: Two times a day (BID) | ORAL | Status: DC
  Administered 2016-03-21 – 2016-03-25 (×8): 50 mg via ORAL
  Filled 2016-03-21 (×8): qty 1

## 2016-03-21 MED ORDER — METOPROLOL TARTRATE 25 MG PO TABS
25.0000 mg | ORAL_TABLET | Freq: Once | ORAL | Status: AC
  Administered 2016-03-21: 25 mg via ORAL

## 2016-03-21 NOTE — Consult Note (Signed)
Urology Consult Note   Requesting Attending Physician:  Louellen Molder, MD Service Providing Consult: Urology Consulting Attending: Matilde Sprang, MD  Assessment:  Patient is a 65 y.o. 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 S/p OR debridement of necrotic tissue 9/29, 10/1, and 10/3 with aspiration of right hydrocele on 10/3.   Interval: AFVSS. 7L UOP recorded via Foley (5.1 overnight). No new labs. Switched to PO bactrim  Recommendations: 1. Agree with general surgery's plan for BID dressing changes  2. Appreciate medicine's involvement in this patient's care, DM control/CAD, etc.  Thank you for this consult. Please contact the urology consult pager with any further questions/concerns. Sharmaine Base, MD Urology Surgical Resident  Subjective: 7/10 pain at baseline. Not ambulating. Significant pain with dressing change but tolerated at bedside yesterday. No n/v. Decreased appetite. + flatus/bm.  Objective   Vital signs in last 24 hours: BP (!) 150/62   Pulse 75   Temp 98.5 F (36.9 C) (Oral)   Resp 18   Ht 6' (1.829 m)   Wt 190 lb (86.2 kg)   SpO2 93%   BMI 25.77 kg/m   Intake/Output last 3 shifts: I/O last 3 completed shifts: In: [id number] [P.O.:920; Other:3050; IV Piggyback:450] Out: [id number] [Urine:7250]  Physical Exam General: NAD, A&O HEENT: [place]/AT, EOMI, MMM Pulmonary: Normal work of breathing Cardiovascular: Regular rate & rhythm, HDS, adequate peripheral perfusion Abdomen: soft, NTTP, nondistended, no suprapubic fullness or tenderness GU: Foley draining yellow urine, mesh underwear in place with no significant soak through, overlying gauze in wound bed appears dry with no soak through, scrotal & perineal dressing with minimal SS soakthrough; dressing changed revealing healthy appearing red/pink granulation tissue with minor fibrinous exudate, there are a couple of sutures in the perineal right & left buttock skin  flaps re-approximating tissue, no significant foul smell or obvious areas of necrosis Extremities: warm and well perfused, no edema   Most Recent Labs: Lab Results  Component Value Date   WBC 6.9 03/20/2016   HGB 11.2 (L) 03/20/2016   HCT 35.1 (L) 03/20/2016   PLT 386 03/20/2016    Lab Results  Component Value Date   NA 137 03/19/2016   K 4.1 03/19/2016   CL 98 (L) 03/19/2016   CO2 35 (H) 03/19/2016   BUN 6 03/19/2016   CREATININE 0.64 03/19/2016   CALCIUM 8.3 (L) 03/19/2016   MG 1.6 (L) 03/15/2016   PHOS 2.9 03/15/2016    Lab Results  Component Value Date   ALKPHOS 39 03/15/2016   BILITOT 1.2 03/15/2016   PROT 4.3 (L) 03/15/2016   ALBUMIN 1.9 (L) 03/15/2016   ALT 10 (L) 03/15/2016   AST 22 03/15/2016    No results found for: INR, APTT   Urine Culture: No growth   IMAGING: No new

## 2016-03-21 NOTE — Care Management Note (Signed)
Case Management Note  Patient Details  Name: Jonathan Tucker MRN: [id number] Date of Birth: 04/26/1951  Subjective/Objective: Spoke to patient in rm about d/c plans-informed of HHC(Intermittent services) vs SNF-patient feels SNF may be more appropriate since dsg changes is something he feels his family will not be able to do.CSW already following.                   Action/Plan:d/c plan SNF.   Expected Discharge Date:   (unknown)               Expected Discharge Plan:  Skilled Nursing Facility  In-House Referral:  Clinical Social Work  Discharge planning Services  CM Consult  Post Acute Care Choice:    Choice offered to:     DME Arranged:    DME Agency:     HH Arranged:    [place] Agency:     Status of Service:  In process, will continue to follow  If discussed at Long Length of Stay Meetings, dates discussed:    Additional Comments:  Dessa Phi, RN 03/21/2016, 1:28 PM

## 2016-03-21 NOTE — Evaluation (Signed)
Physical Therapy Evaluation Patient Details Name: Jonathan Tucker MRN: [id number] DOB: 15-Aug-1950 Today's Date: 03/21/2016   History of Present Illness  65 year old male with type 2 diabetes mellitus, dyslipidemia, hypertension was admitted 03/14/16 with concern  for Fournier's gangrene. S/p I and D 9/30 and partial closure 10/3.   Clinical Impression  The patient did mobilize to sitting at the bed edge, made attempt to stand but limited by pain and returned to sidelying. The patient complains of dizziness when rolling to the Right. He did not report dizziness when sitting on the bed edge after rolling to the left. Will continue to assess dizziness. Recommend OT consult.  Pt admitted with above diagnosis. Pt currently with functional limitations due to the deficits listed below (see PT Problem List). Pt will benefit from skilled PT to increase their independence and safety with mobility to allow discharge to the venue listed below.   The patient did put forth effort and mobilized within his tolerance and is willing to try again later. RN to notify PT when timing is good.  ALSO, PATIENT CAN BENEFIT FROM OVERHEAD TRAPEZE.    Follow Up Recommendations Home health PT;SNF;Supervision/Assistance - 24 hour (depends on progress.)    Equipment Recommendations  Rolling walker with 5" wheels    Recommendations for Other Services OT consult     Precautions / Restrictions Precautions Precaution Comments: patient has been bedrest  since 9/29, has perineal wounds. Patient complains of increased dizziness when he rolls to the right.      Mobility  Bed Mobility Overal bed mobility: Needs Assistance Bed Mobility: Rolling;Sidelying to Sit;Sit to Sidelying Rolling: Modified independent (Device/Increase time) Sidelying to sit: Mod assist     Sit to sidelying: Mod assist General bed mobility comments: use of bed rails to roll to the left and sit up on bed edge, tends to sit to the right to offset weight .   Able to get self back to sidelying. sat x 2 minutes ,limited by pain.  Transfers Overall transfer level: Needs assistance   Transfers: Sit to/from Stand           General transfer comment: made attempt to stand from the bed at the RW, unable to fully stand due to pain.   Ambulation/Gait                Stairs            Wheelchair Mobility    Modified Rankin (Stroke Patients Only)       Balance Overall balance assessment: Needs assistance Sitting-balance support: Bilateral upper extremity supported;Feet supported   Sitting balance - Comments: relies on the arms for support to decrease weight on the bottom.                                     Pertinent Vitals/Pain Pain Assessment: 0-10 Pain Score: 9  Pain Location: periarea, abdomen, lower right side Pain Descriptors / Indicators: Cramping;Discomfort;Grimacing;Guarding;Sore;Restless Pain Intervention(s): Limited activity within patient's tolerance;Monitored during session;Premedicated before session;Repositioned    Home Living Family/patient expects to be discharged to:: Private residence Living Arrangements: Spouse/significant other Available Help at Discharge: Family Type of Home: House Home Access: Stairs to enter   Technical brewer of Steps: 3 Home Layout: One level Home Equipment: None      Prior Function Level of Independence: Independent               Hand  Dominance        Extremity/Trunk Assessment   Upper Extremity Assessment: Overall WFL for tasks assessed           Lower Extremity Assessment: Generalized weakness         Communication   Communication: No difficulties  Cognition Arousal/Alertness: Awake/alert Behavior During Therapy: Restless;WFL for tasks assessed/performed;Anxious Overall Cognitive Status: Within Functional Limits for tasks assessed                      General Comments      Exercises     Assessment/Plan     PT Assessment Patient needs continued PT services  PT Problem List Decreased activity tolerance;Decreased strength;Decreased mobility;Pain          PT Treatment Interventions DME instruction;Gait training;Functional mobility training;Therapeutic activities;Stair training;Therapeutic exercise;Patient/family education    PT Goals (Current goals can be found in the Care Plan section)  Acute Rehab PT Goals Patient Stated Goal: to get up and walk, not be in pain PT Goal Formulation: With patient/family Time For Goal Achievement: 04/04/16 Potential to Achieve Goals: Good    Frequency Min 3X/week   Barriers to discharge        Co-evaluation               End of Session   Activity Tolerance: Patient limited by pain Patient left: in bed;with call bell/phone within reach;with family/visitor present Nurse Communication: Mobility status         Time: 0805-0821 PT Time Calculation (min) (ACUTE ONLY): 16 min   Charges:   PT Evaluation $PT Eval Low Complexity: 1 Procedure     PT G CodesClaretha Cooper 03/21/2016, 8:35 AM Tresa Endo PT [phone]

## 2016-03-21 NOTE — Progress Notes (Signed)
PROGRESS NOTE                                                                                                                                                                                                             Patient Demographics:    Jonathan Tucker, is a 65 y.o. male, DOB - 01-21-1951, [id number]  Admit date - 03/14/2016   Admitting Physician Murlean Iba, MD  Outpatient Primary MD for the patient is [name],[name], PA-C  LOS - 7  Outpatient Specialists:none  Chief Complaint  Patient presents with  . Groin Swelling       Brief Narrative   65 year old male with type 2 diabetes mellitus, dyslipidemia, hypertension was referred for admission by urology with concern for Fournier's gangrene. Patient was septic in the ED with hypotension (BP 62/29 mmHg) cardiac to 110s and tachypnea. He was afebrile. Blood work showed leukocytosis with WBC of 17, sodium of 124 and lactic acid of 3. CT scan was concerning for Fournier's gangrene. Patient seen by surgery and underwent I&D of the perineal and scrotal abscess on 9/29 and 10/1 followed by debridement of the gangrene and aspiration of hydrocele fluid with partial closure of the gangrene on 10/3.   Subjective:   Still has pain worse with dressing.Was able to sit at the site of the bed today.   Assessment  & Plan :    Principal Problem: Severe sepsis secondary to Fournier's gangrene Underwent multiple debridements by surgery and urology.  Blood and scrotal abscess culture negative. Switched abx to bactrim. Treat for at least 2 weeks.  Pain control with hydrocodone and Dilaudid when necessary. Dressing per surgery. Continue Foley catheter.  Active Problems: Acute hypoxic respiratory failure (HCC) Became hypoxic after I&D on 10/1, chest x-ray showing pulmonary vascular congestion. Respiratory status currently stable. 2-D echo on 10/3 showing EF of 55 and 60% with no  wall motion abnormality. Currently stable.  New onset A. fib with RVR Suspected this is associated with sepsis. Low-dose beta blocker but heart rate elevated to 120-130s (worsened with pain). Increase metoprolol to 50 mg twice a day. Continue aspirin. CHADS2vasc score of 1, no need for anticoagulation. 2-D echo reviewed. Appreciate cardiology evaluation.  Hydrocele of scrotum Aspirated on 10/3.      Uncontrolled diabetes mellitus ([place]) Worsened with sepsis. A1c of 6.5. Now stable on low  dose Lantus with pre-meal aspart.    Hyponatremia Secondary to dehydration and sepsis. Now resolved.  Acute kidney injury ([place]) Secondary to sepsis. Resolved.    Acute blood loss anemia Currently stable.  Dyslipidemia Continue home medications  Essential hypertension On metoprolol. Dose increased for better rate control.         Code Status : Full code  Family Communication  : Wife at bedside  Disposition Plan  : Home once clinically improved  Barriers For Discharge : active symptoms. IV pain meds. PT eval   Consults  :   Surgery Neurology Cardiology  Procedures  :  I&D of the perineal and scrotal abscess on 9/29, 10/1 and 10/4  DVT Prophylaxis  : Lovenox  Lab Results  Component Value Date   PLT 386 03/20/2016    Antibiotics  :   Anti-infectives    Start     Dose/Rate Route Frequency Ordered Stop   03/20/16 1500  sulfamethoxazole-trimethoprim (BACTRIM DS,SEPTRA DS) 800-160 MG per tablet 1 tablet     1 tablet Oral Every 12 hours 03/20/16 1248     03/16/16 1600  vancomycin (VANCOCIN) IVPB 1000 mg/200 mL premix  Status:  Discontinued     1,000 mg 200 mL/hr over 60 Minutes Intravenous Every 8 hours 03/16/16 1131 03/20/16 1248   03/14/16 2200  vancomycin (VANCOCIN) IVPB 750 mg/150 ml premix  Status:  Discontinued     750 mg 150 mL/hr over 60 Minutes Intravenous Every 12 hours 03/14/16 1220 03/16/16 1131   03/14/16 2000  piperacillin-tazobactam (ZOSYN) IVPB 3.375 g   Status:  Discontinued     3.375 g 12.5 mL/hr over 240 Minutes Intravenous Every 8 hours 03/14/16 1218 03/20/16 1248   03/14/16 1520  polymyxin B 500,000 Units, bacitracin 50,000 Units in sodium chloride irrigation 0.9 % 500 mL irrigation  Status:  Discontinued       As needed 03/14/16 1521 03/14/16 1603   03/14/16 1045  vancomycin (VANCOCIN) IVPB 1000 mg/200 mL premix     1,000 mg 200 mL/hr over 60 Minutes Intravenous  Once 03/14/16 1033 03/14/16 1348   03/14/16 1030  clindamycin (CLEOCIN) IVPB 900 mg     900 mg 100 mL/hr over 30 Minutes Intravenous  Once 03/14/16 1019 03/14/16 1246   03/14/16 1030  piperacillin-tazobactam (ZOSYN) IVPB 3.375 g     3.375 g 100 mL/hr over 30 Minutes Intravenous  Once 03/14/16 1027 03/14/16 1304        Objective:   Vitals:   03/20/16 1100 03/20/16 1503 03/20/16 2135 03/21/16 0602  BP:  136/65 (!) 153/81 134/80  Pulse:  73 86 75  Resp:  16 18 18   Temp:  98.4 F (36.9 C) 98.8 F (37.1 C) 98.5 F (36.9 C)  TempSrc:  Oral Oral Oral  SpO2: 96% 94% 90% 93%  Weight:      Height:        Wt Readings from Last 3 Encounters:  03/14/16 86.2 kg (190 lb)     Intake/Output Summary (Last 24 hours) at 03/21/16 1038 Last data filed at 03/21/16 0850  Gross per 24 hour  Intake              200 ml  Output             7100 ml  Net            -6900 ml     Physical Exam  Gen: not in distress HEENT: moist mucosa, supple neck Chest: clear b/l, no  added sounds CVS: s1&s2 Tachycardic, no murmurs  GI: soft, NT, ND, BS+, Dressing over scrotum, Foley catheter+ Musculoskeletal: warm, no edema     Data Review:    CBC  Recent Labs Lab 03/15/16 0122 03/16/16 0317 03/17/16 0306 03/18/16 1900 03/19/16 0320 03/20/16 0502  WBC 14.2* 14.5* 12.2* 12.0* 10.0 6.9  HGB 10.5* 10.9* 10.6* 12.4* 11.4* 11.2*  HCT 30.5* 32.0* 31.7* 38.3* 35.0* 35.1*  PLT 181 228 300 377 407* 386  MCV 90.0 91.4 92.4 94.3 94.9 95.4  MCH 31.0 31.1 30.9 30.5 30.9 30.4  MCHC  34.4 34.1 33.4 32.4 32.6 31.9  RDW 13.8 14.1 14.4 14.4 14.5 14.3  LYMPHSABS 0.7  --   --   --   --   --   MONOABS 1.0  --   --   --   --   --   EOSABS 0.0  --   --   --   --   --   BASOSABS 0.1  --   --   --   --   --     Chemistries   Recent Labs Lab 03/15/16 0122 03/16/16 0317 03/18/16 0314 03/18/16 1900 03/19/16 0320  NA 127* 135 137 139 137  K 4.2 3.7 3.9 4.2 4.1  CL 100* 104 101 101 98*  CO2 21* 24 31 32 35*  GLUCOSE 167* 130* 153* 145* 154*  BUN 22* 11 6 6 6   CREATININE 1.08 0.67 0.74 0.62 0.64  CALCIUM 6.7* 7.4* 8.1* 8.4* 8.3*  MG 1.6*  --   --   --   --   AST 22  --   --   --   --   ALT 10*  --   --   --   --   ALKPHOS 39  --   --   --   --   BILITOT 1.2  --   --   --   --    ------------------------------------------------------------------------------------------------------------------ No results for input(s): CHOL, HDL, LDLCALC, TRIG, CHOLHDL, LDLDIRECT in the last 72 hours.  Lab Results  Component Value Date   HGBA1C 6.5 (H) 03/14/2016   ------------------------------------------------------------------------------------------------------------------ No results for input(s): TSH, T4TOTAL, T3FREE, THYROIDAB in the last 72 hours.  Invalid input(s): FREET3 ------------------------------------------------------------------------------------------------------------------ No results for input(s): VITAMINB12, FOLATE, FERRITIN, TIBC, IRON, RETICCTPCT in the last 72 hours.  Coagulation profile No results for input(s): INR, PROTIME in the last 168 hours.  No results for input(s): DDIMER in the last 72 hours.  Cardiac Enzymes No results for input(s): CKMB, TROPONINI, MYOGLOBIN in the last 168 hours.  Invalid input(s): CK ------------------------------------------------------------------------------------------------------------------ No results found for: BNP  Inpatient Medications  Scheduled Meds: . aspirin EC  325 mg Oral QPM  . colesevelam  1,250 mg  Oral BID WC  . fenofibrate  160 mg Oral Daily  .  HYDROmorphone (DILAUDID) injection  1 mg Intravenous BID  . insulin aspart  0-9 Units Subcutaneous TID WC  . insulin aspart  3 Units Subcutaneous TID WC  . insulin glargine  8 Units Subcutaneous Daily  . metoprolol tartrate  50 mg Oral BID  . metoprolol tartrate  25 mg Oral Once  . rosuvastatin  5 mg Oral QPM  . sulfamethoxazole-trimethoprim  1 tablet Oral Q12H   Continuous Infusions:  PRN Meds:.acetaminophen **OR** acetaminophen, haloperidol lactate, HYDROcodone-acetaminophen, HYDROmorphone (DILAUDID) injection, ipratropium-albuterol, ondansetron **OR** ondansetron (ZOFRAN) IV, senna-docusate, zolpidem  Micro Results Recent Results (from the past 240 hour(s))  Blood Culture (routine x 2)  Status: None   Collection Time: 03/14/16 10:34 AM  Result Value Ref Range Status   Specimen Description BLOOD LEFT ANTECUBITAL  Final   Special Requests BOTTLES DRAWN AEROBIC AND ANAEROBIC 5ML  Final   Culture   Final    NO GROWTH 5 DAYS Performed at [facility]    Report Status 03/19/2016 FINAL  Final  Blood Culture (routine x 2)     Status: None   Collection Time: 03/14/16 10:46 AM  Result Value Ref Range Status   Specimen Description BLOOD LEFT HAND  Final   Special Requests BOTTLES DRAWN AEROBIC AND ANAEROBIC 5ML  Final   Culture   Final    NO GROWTH 5 DAYS Performed at [facility]    Report Status 03/19/2016 FINAL  Final  Anaerobic culture     Status: None   Collection Time: 03/14/16  3:01 PM  Result Value Ref Range Status   Specimen Description   Final    ABSCESS IRRIGATION AND DEBRIDEMENT SCROTAL ABCESS AND CYTOSCOPY   Special Requests NONE  Final   Culture   Final    MIXED ANAEROBIC FLORA PRESENT.  CALL LAB IF FURTHER IID REQUIRED.   Report Status 03/19/2016 FINAL  Final  Aerobic Culture (superficial specimen)     Status: None   Collection Time: 03/14/16  3:01 PM  Result Value Ref Range Status   Specimen  Description   Final    ABSCESS IRRIGATION AND DEBRIDEMENT SCROTAL ABCESS AND CYTOSCOPY   Special Requests NONE  Final   Gram Stain   Final    FEW WBC PRESENT, PREDOMINANTLY MONONUCLEAR ABUNDANT GRAM NEGATIVE RODS FEW GRAM POSITIVE COCCI IN PAIRS FEW GRAM POSITIVE RODS    Culture   Final    NORMAL SKIN FLORA Performed at [facility]    Report Status 03/17/2016 FINAL  Final  Fungus Stain     Status: None   Collection Time: 03/14/16  3:01 PM  Result Value Ref Range Status   FUNGUS STAIN Final report  Final    Comment: (NOTE) Performed At: [facility] [place], [place] [id number] Lindon Romp MD [id number]    Fungal Source ABSCESS  Final    Comment: IRRIGATION AND DEBRIDEMENT SCROTAL ABCESS AND CYTOSCOPY  Fungal Stain reflex     Status: None   Collection Time: 03/14/16  3:01 PM  Result Value Ref Range Status   Fungal stain result 1 Comment  Final    Comment: (NOTE) KOH/Calcofluor preparation:  no fungus observed. Performed At: [facility] [place], [place] [id number] Lindon Romp MD [id number]   MRSA PCR Screening     Status: None   Collection Time: 03/14/16  5:32 PM  Result Value Ref Range Status   MRSA by PCR NEGATIVE NEGATIVE Final    Comment:        The GeneXpert MRSA Assay (FDA approved for NASAL specimens only), is one component of a comprehensive MRSA colonization surveillance program. It is not intended to diagnose MRSA infection nor to guide or monitor treatment for MRSA infections.   Urine culture     Status: None   Collection Time: 03/14/16  8:02 PM  Result Value Ref Range Status   Specimen Description URINE, CATHETERIZED  Final   Special Requests NONE  Final   Culture NO GROWTH Performed at [facility]   Final   Report Status 03/16/2016 FINAL  Final    Radiology Reports Ct Abdomen Pelvis W  Contrast  Result Date: 03/14/2016 CLINICAL DATA:  65 year old male with diabetes  mellitus and scrotal infection with fever and erythema spreading to the right buttock. Clinical concern for Fournier's gangrene. EXAM: CT ABDOMEN AND PELVIS WITH CONTRAST TECHNIQUE: Multidetector CT imaging of the abdomen and pelvis was performed using the standard protocol following bolus administration of intravenous contrast. CONTRAST:  184mL ISOVUE-300 IOPAMIDOL (ISOVUE-300) INJECTION 61% COMPARISON:  None. FINDINGS: Lower chest: No significant pulmonary nodules or acute consolidative airspace disease. Centrilobular emphysema at the lung bases. Coronary atherosclerosis. Hepatobiliary: Diffuse hepatic steatosis. No liver mass. Normal gallbladder with no radiopaque cholelithiasis. No biliary ductal dilatation. Pancreas: Normal, with no mass or duct dilation. Spleen: Normal size. No mass. Adrenals/Urinary Tract: Normal adrenals. No hydronephrosis. Hypodense 0.6 cm renal cortical lesion in the lateral interpolar left kidney, too small to characterize, for which no further follow-up is required. No additional renal lesions. Normal bladder. Stomach/Bowel: Grossly normal stomach. Normal caliber small bowel with no small bowel wall thickening. Normal appendix. Normal large bowel with no diverticulosis, large bowel wall thickening or pericolonic fat stranding. Vascular/Lymphatic: Atherosclerotic nonaneurysmal abdominal aorta. Patent portal, splenic, hepatic and renal veins. Bilateral inguinal adenopathy measuring up to 2.1 cm on the right (series 2/ image 78) and 2.0 cm on the left (series 2/ image 77). No additional pathologically enlarged abdominopelvic nodes. Reproductive: Normal size prostate with nonspecific internal prostatic calcifications. Symmetric normal size seminal vesicles. Large right hydrocele measuring 7.5 x 6.4 cm (series 3/image 18). No left hydrocele. Severe thickening of the skin and subcutaneous soft tissues of the entire scrotum. Skin thickening, subcutaneous fat stranding and extensive subcutaneous  emphysema throughout the perineum and bilateral medial buttocks, extending into the right ischial rectal fossa and deep right upper scrotal soft tissues. Subcutaneous abscess in the right perineum measuring 4.1 x 2.7 x 3.1 cm (series 2/image 99), demonstrating air-fluid level and thick enhancing wall. No additional focal drainable fluid collections in the abdomen or pelvis. Other: No pneumoperitoneum, ascites or focal fluid collection. Musculoskeletal: No aggressive appearing focal osseous lesions. Mild-to-moderate thoracolumbar spondylosis. IMPRESSION: 1. CT findings consistent with Fournier's gangrene, with extensive skin thickening, subcutaneous fat stranding and subcutaneous emphysema throughout the perineum, bilateral medial buttocks, right ischiorectal fossa and scrotum. 2. Subcutaneous abscess in the right perineum. Large right hydrocele. No ascites or intraperitoneal gas or fluid collections. 3. Additional findings include aortic atherosclerosis, coronary atherosclerosis, centrilobular emphysema and diffuse hepatic steatosis. These results were called by telephone at the time of interpretation on 03/14/2016 at 12:29 pm to Dr. Addison Lank , who verbally acknowledged these results. Electronically Signed   By: [name] M.D.   On: 03/14/2016 12:32   Dg Chest Port 1 View  Result Date: 03/16/2016 CLINICAL DATA:  Post I&D today now with confusion, hypoxia and hypotension. EXAM: PORTABLE CHEST 1 VIEW COMPARISON:  03/12/2016; 06/27/2010 FINDINGS: Examination is degraded secondary to exclusion of the left costophrenic angle. Grossly unchanged enlarged cardiac silhouette and mediastinal contours given reduced lung volumes. Atherosclerotic plaque within the thoracic aorta. Pulmonary venous congestion without frank evidence of edema. Worsening bilateral infrahilar opacities favored to represent atelectasis. No discrete focal airspace opacities. No definite pleural effusion, though note, the left costophrenic  angle is excluded from view. No pneumothorax. No acute osseus abnormalities. IMPRESSION: Cardiomegaly and pulmonary venous congestion without definitive superimposed acute cardiopulmonary disease on these degraded AP portable examinations. Further evaluation with a PA and lateral chest radiographs may be obtained as clinically indicated. Electronically Signed   By: Eldridge Abrahams.D.  On: 03/16/2016 14:57   Dg Chest Port 1 View  Result Date: 03/14/2016 CLINICAL DATA:  Sepsis.  Smoker. EXAM: PORTABLE CHEST 1 VIEW COMPARISON:  06/27/2010 . FINDINGS: Mediastinum and hilar structures normal. Mild cardiomegaly. Mild right base infiltrate cannot be excluded. No pleural effusion or pneumothorax . No acute bony abnormality. IMPRESSION: 1. Mild right base infiltrate cannot be excluded. 2. Mild cardiomegaly. Electronically Signed   By: Marcello Moores  Register   On: 03/14/2016 12:40    Time Spent in minutes  25   Louellen Molder M.D on 03/21/2016 at 10:38 AM  Between 7am to 7pm - Pager - [phone]  After 7pm go to [email or web address] - password [facility]  [facility] -  Office  [phone]

## 2016-03-21 NOTE — NC FL2 (Signed)
[place] LEVEL OF CARE SCREENING TOOL     IDENTIFICATION  Patient Name: Jonathan Tucker Birthdate: 03-Nov-1950 Sex: male Admission Date (Current Location): 03/14/2016  [facility] and [place] Number:  Herbalist and Address:  [facility],  [place] [address] [place]      Provider Number: [id number]  Attending Physician Name and Address:  Louellen Molder, MD  Relative Name and Phone Number:       Current Level of Care: Hospital Recommended Level of Care: [place] Prior Approval Number:    Date Approved/Denied:   PASRR Number: [id number]  Discharge Plan:      Current Diagnoses: Patient Active Problem List   Diagnosis Date Noted  . Atrial fibrillation with RVR ([place]) 03/17/2016  . Essential hypertension 03/17/2016  . Dyslipidemia 03/17/2016  . Uncontrolled diabetes mellitus ([place]) 03/14/2016  . Fournier's gangrene in male 03/14/2016  . Hyponatremia 03/14/2016  . Leukocytosis 03/14/2016  . Acute renal insufficiency 03/14/2016  . Current every day smoker 03/14/2016  . Severe sepsis ([place]) 03/14/2016  . Rigors 03/14/2016  . COPD (chronic obstructive pulmonary disease) ([place]) 03/14/2016    Orientation RESPIRATION BLADDER Height & Weight     Self, Time, Situation, Place  Normal Incontinent, Indwelling catheter Weight: 190 lb (86.2 kg) Height:  6' (182.9 cm)  BEHAVIORAL SYMPTOMS/MOOD NEUROLOGICAL BOWEL NUTRITION STATUS      Continent Diet (Regular)  AMBULATORY STATUS COMMUNICATION OF NEEDS Skin   Extensive Assist Verbally Surgical wounds (Incision (Closed) 03/14/16 Scrotum & Incision (Closed) 03/16/16 Perineum )                       Personal Care Assistance Level of Assistance  Bathing, Dressing Bathing Assistance: Limited assistance   Dressing Assistance: Limited assistance     Functional Limitations Info             [place]  PT (By licensed PT), OT (By licensed OT)     PT  Frequency: 5 OT Frequency: 5            Contractures      Additional Factors Info  Code Status, Allergies Code Status Info: Fullcode Allergies Info: Codeine, Flexeril Cyclobenzaprine, Tape           Current Medications (03/21/2016):  This is the current hospital active medication list Current Facility-Administered Medications  Medication Dose Route Frequency Provider Last Rate Last Dose  . acetaminophen (TYLENOL) tablet 650 mg  650 mg Oral Q6H PRN Clanford Marisa Hua, MD   650 mg at 03/19/16 0518   Or  . acetaminophen (TYLENOL) suppository 650 mg  650 mg Rectal Q6H PRN Clanford Marisa Hua, MD      . aspirin EC tablet 325 mg  325 mg Oral QPM Clanford Marisa Hua, MD   325 mg at 03/20/16 1718  . colesevelam [facility]) tablet 1,250 mg  1,250 mg Oral BID WC Clanford Marisa Hua, MD   1,250 mg at 03/21/16 0843  . fenofibrate tablet 160 mg  160 mg Oral Daily Clanford Marisa Hua, MD   160 mg at 03/21/16 1043  . haloperidol lactate (HALDOL) injection 1 mg  1 mg Intravenous Q6H PRN Robbie Lis, MD      . HYDROcodone-acetaminophen (NORCO/VICODIN) 5-325 MG per tablet 2 tablet  2 tablet Oral Q6H PRN Louellen Molder, MD   2 tablet at 03/21/16 0610  . HYDROmorphone (DILAUDID) injection 1 mg  1 mg Intravenous BID Leighton Ruff,  MD   1 mg at 03/21/16 1042  . HYDROmorphone (DILAUDID) injection 2 mg  2 mg Intravenous Q4H PRN Nishant Dhungel, MD   2 mg at 03/20/16 0810  . insulin aspart (novoLOG) injection 0-9 Units  0-9 Units Subcutaneous TID WC Clanford Marisa Hua, MD   1 Units at 03/21/16 0843  . insulin aspart (novoLOG) injection 3 Units  3 Units Subcutaneous TID WC Clanford Marisa Hua, MD   3 Units at 03/21/16 0843  . insulin glargine (LANTUS) injection 8 Units  8 Units Subcutaneous Daily Clanford Marisa Hua, MD   8 Units at 03/21/16 1043  . ipratropium-albuterol (DUONEB) 0.5-2.5 (3) MG/3ML nebulizer solution 3 mL  3 mL Nebulization Q6H PRN Nishant Dhungel, MD      . metoprolol (LOPRESSOR) tablet 50 mg   50 mg Oral BID Nishant Dhungel, MD      . ondansetron (ZOFRAN) tablet 4 mg  4 mg Oral Q6H PRN Clanford Marisa Hua, MD       Or  . ondansetron (ZOFRAN) injection 4 mg  4 mg Intravenous Q6H PRN Clanford L Johnson, MD      . rosuvastatin (CRESTOR) tablet 5 mg  5 mg Oral QPM Clanford Marisa Hua, MD   5 mg at 03/20/16 1718  . senna-docusate (Senokot-S) tablet 1 tablet  1 tablet Oral QHS PRN Clanford Marisa Hua, MD      . sulfamethoxazole-trimethoprim (BACTRIM DS,SEPTRA DS) 800-160 MG per tablet 1 tablet  1 tablet Oral Q12H Nishant Dhungel, MD   1 tablet at 03/21/16 1043  . zolpidem (AMBIEN) tablet 5 mg  5 mg Oral QHS PRN Clanford Marisa Hua, MD   5 mg at 03/14/16 2235     Discharge Medications: Please see discharge summary for a list of discharge medications.  Relevant Imaging Results:  Relevant Lab Results:   Additional Information SSN: [id number]  Standley Brooking, LCSW

## 2016-03-21 NOTE — Progress Notes (Signed)
[facility] Progress Note  3 Days Post-Op  Subjective: NAE overnight, VSS.  Having some night sweats   Objective: Vital signs in last 24 hours: Temp:  [98.4 F (36.9 C)-98.8 F (37.1 C)] 98.5 F (36.9 C) (10/06 0602) Pulse Rate:  [73-86] 75 (10/06 0602) Resp:  [16-18] 18 (10/06 0602) BP: (134-153)/(65-81) 134/80 (10/06 0602) SpO2:  [90 %-96 %] 93 % (10/06 0602) Last BM Date: 03/18/16  Intake/Output from previous day: 10/05 0701 - 10/06 0700 In: 640 [P.O.:440; IV Piggyback:200] Out: [id number] [Urine:7250] Intake/Output this shift: No intake/output data recorded.  PE: Gen:  Alert, NAD, pleasant and cooperative Card:  RRR, no M/G/R heard Abd: soft GU: dressing dry and intact  Lab Results:   Recent Labs  03/19/16 0320 03/20/16 0502  WBC 10.0 6.9  HGB 11.4* 11.2*  HCT 35.0* 35.1*  PLT 407* 386   BMET  Recent Labs  03/18/16 1900 03/19/16 0320  NA 139 137  K 4.2 4.1  CL 101 98*  CO2 32 35*  GLUCOSE 145* 154*  BUN 6 6  CREATININE 0.62 0.64  CALCIUM 8.4* 8.3*   CMP     Component Value Date/Time   NA 137 03/19/2016 0320   K 4.1 03/19/2016 0320   CL 98 (L) 03/19/2016 0320   CO2 35 (H) 03/19/2016 0320   GLUCOSE 154 (H) 03/19/2016 0320   BUN 6 03/19/2016 0320   CREATININE 0.64 03/19/2016 0320   CALCIUM 8.3 (L) 03/19/2016 0320   PROT 4.3 (L) 03/15/2016 0122   ALBUMIN 1.9 (L) 03/15/2016 0122   AST 22 03/15/2016 0122   ALT 10 (L) 03/15/2016 0122   ALKPHOS 39 03/15/2016 0122   BILITOT 1.2 03/15/2016 0122   GFRNONAA >60 03/19/2016 0320   GFRAA >60 03/19/2016 0320   Anti-infectives: Anti-infectives    Start     Dose/Rate Route Frequency Ordered Stop   03/20/16 1500  sulfamethoxazole-trimethoprim (BACTRIM DS,SEPTRA DS) 800-160 MG per tablet 1 tablet     1 tablet Oral Every 12 hours 03/20/16 1248     03/16/16 1600  vancomycin (VANCOCIN) IVPB 1000 mg/200 mL premix  Status:  Discontinued     1,000 mg 200 mL/hr over 60 Minutes Intravenous Every 8  hours 03/16/16 1131 03/20/16 1248   03/14/16 2200  vancomycin (VANCOCIN) IVPB 750 mg/150 ml premix  Status:  Discontinued     750 mg 150 mL/hr over 60 Minutes Intravenous Every 12 hours 03/14/16 1220 03/16/16 1131   03/14/16 2000  piperacillin-tazobactam (ZOSYN) IVPB 3.375 g  Status:  Discontinued     3.375 g 12.5 mL/hr over 240 Minutes Intravenous Every 8 hours 03/14/16 1218 03/20/16 1248   03/14/16 1520  polymyxin B 500,000 Units, bacitracin 50,000 Units in sodium chloride irrigation 0.9 % 500 mL irrigation  Status:  Discontinued       As needed 03/14/16 1521 03/14/16 1603   03/14/16 1045  vancomycin (VANCOCIN) IVPB 1000 mg/200 mL premix     1,000 mg 200 mL/hr over 60 Minutes Intravenous  Once 03/14/16 1033 03/14/16 1348   03/14/16 1030  clindamycin (CLEOCIN) IVPB 900 mg     900 mg 100 mL/hr over 30 Minutes Intravenous  Once 03/14/16 1019 03/14/16 1246   03/14/16 1030  piperacillin-tazobactam (ZOSYN) IVPB 3.375 g     3.375 g 100 mL/hr over 30 Minutes Intravenous  Once 03/14/16 1027 03/14/16 1304     Assessment/Plan Assessment/Plan Fournier's Gangrene    S/p Irrigation and debridement 03/15/16, Dr. Harlow Asa and Dr. Lorra Hals  Dressing change in OR, limited debridement 03/16/16, Dr. Harlow Asa and Dr. Lorra Hals    Debridement and partial closure perineal wound 03/18/16. Dr. Leighton Ruff and Dr. Lorra Hals    Bedside dressing changes beginning 03/19/16   FEN: Regular  ID: Vancomycin, Zosyn  Plan: pre-medicate with 1mg  IV dilaudid and transition to BID wet-to-dry dressing changes today.  PT eval   LOS: 7 days    Rosario Adie, MD  Colorectal and [facility]

## 2016-03-21 NOTE — Progress Notes (Signed)
Patient doing better Frustrated re dressing changes and pain medication Scrotum looks good Will leave primary care to general surgery Will follow and see in clinic as well

## 2016-03-21 NOTE — Progress Notes (Signed)
Physical Therapy Treatment Patient Details Name: Jonathan Tucker MRN: [id number] DOB: 03-07-1951 Today's Date: 03/21/2016    History of Present Illness 65 year old male with type 2 diabetes mellitus, dyslipidemia, hypertension was admitted 03/14/16 with concern  for Fournier's gangrene. S/p I and D 9/30 and partial closure 10/3.     PT Comments    The patient did move to sitting for a few seconds. Reports that the packing is most uncomfortable. Continue PT while in acute care.  Follow Up Recommendations  SNF;Supervision/Assistance - 24 hour     Equipment Recommendations  None recommended by PT    Recommendations for Other Services       Precautions / Restrictions Precautions Precaution Comments: patient has been bedrest  since 9/29, has perineal wounds. Patient complains of increased dizziness when he rolls to the right.    Mobility  Bed Mobility     Rolling: Modified independent (Device/Increase time) Sidelying to sit: Supervision     Sit to sidelying: Supervision General bed mobility comments: able to sit up using rails, sat 10 sceonds and retrned to sidelying. Discussed with patient maybe trying to roll to abdomen and place feet to floor and stand up. He currently is more interested in getting infection cleared and feels he can stand when  ready.  Transfers                    Ambulation/Gait                 Stairs            Wheelchair Mobility    Modified Rankin (Stroke Patients Only)       Balance                                    Cognition Arousal/Alertness: Awake/alert                          Exercises      General Comments        Pertinent Vitals/Pain Pain Score: 9  Pain Location: periarea Pain Descriptors / Indicators: Tender;Operative site guarding Pain Intervention(s): Premedicated before session;Repositioned    Home Living                      Prior Function            PT  Goals (current goals can now be found in the care plan section) Progress towards PT goals: Progressing toward goals    Frequency    Min 3X/week      PT Plan Current plan remains appropriate    Co-evaluation             End of Session   Activity Tolerance: Patient limited by pain Patient left: in bed;with call bell/phone within reach     Time: 1615-1630 PT Time Calculation (min) (ACUTE ONLY): 15 min  Charges:  $Therapeutic Activity: 8-22 mins                    G Codes:      Claretha Cooper 03/21/2016, 4:38 PM

## 2016-03-21 NOTE — Clinical Social Work Placement (Signed)
Patient has a bed at [facility]. CSW has completed FL2 & will continue to follow and assist with discharge when ready.    [name], [facility] Clinical Social Worker cell #: [phone]     CLINICAL SOCIAL WORK PLACEMENT  NOTE  Date:  03/21/2016  Patient Details  Name: Jonathan Tucker MRN: [id number] Date of Birth: [birth year]  Clinical Social Work is seeking post-discharge placement for this patient at the [place] level of care (*CSW will initial, date and re-position this form in  chart as items are completed):  Yes   Patient/family provided with [place] Work Department's list of facilities offering this level of care within the geographic area requested by the patient (or if unable, by the patient's family).  Yes   Patient/family informed of their freedom to choose among providers that offer the needed level of care, that participate in Medicare, Medicaid or managed care program needed by the patient, have an available bed and are willing to accept the patient.  Yes   Patient/family informed of [facility]'s ownership interest in [facility] and [facility], as well as of the fact that they are under no obligation to receive care at these facilities.  PASRR submitted to EDS on 03/21/16     PASRR number received on 03/21/16     Existing PASRR number confirmed on       FL2 transmitted to all facilities in geographic area requested by pt/family on 03/21/16     FL2 transmitted to all facilities within larger geographic area on       Patient informed that his/her managed care company has contracts with or will negotiate with certain facilities, including the following:        Yes   Patient/family informed of bed offers received.  Patient chooses bed at [facility] recommends and patient chooses bed at      Patient to be transferred to [facility] on   .  Patient to be transferred to facility by       Patient family notified on   of transfer.  Name of family member notified:        PHYSICIAN       Additional Comment:    _______________________________________________ Standley Brooking, LCSW 03/21/2016, 1:52 PM

## 2016-03-21 NOTE — Clinical Social Work Note (Signed)
Clinical Social Work Assessment  Patient Details  Name: Jonathan Tucker MRN: [id number] Date of Birth: 19-Dec-1950  Date of referral:  03/21/16               Reason for consult:                   Permission sought to share information with:  Chartered certified accountant granted to share information::  Yes, Verbal Permission Granted  Name::        Agency::     Relationship::     Contact Information:     Housing/Transportation Living arrangements for the past 2 months:  Single Family Home Source of Information:  Patient, Spouse Patient Interpreter Needed:  None Criminal Activity/Legal Involvement Pertinent to Current Situation/Hospitalization:  No - Comment as needed Significant Relationships:  Spouse Lives with:  Spouse Do you feel safe going back to the place where you live?  No Need for family participation in patient care:  Yes (Comment)  Care giving concerns:  CSW received consult from [facility], Juliann Pulse that patient & wife at bedside are now agreeable with plan for SNF.    Social Worker assessment / plan:  Patient's wife is concerned about dressing care on the wound, feels that if he were to go to a SNF before returning that it would have a better chance at healing.   Employment status:  Retired Forensic scientist:  Information systems manager PT Recommendations:  [place] [facility], Home with [place] / Referral to community resources:  [place]  Patient/Family's Response to care:  Patient states that he has never been to SNF before, but that his father had been to [place] and actually passed away there about 4 years ago & his mother had been to [employer]. Patient is agreeable to go to [place] confirmed with Suanne Marker at [facility] that they would be able to take him when ready.   Patient/Family's Understanding of and Emotional Response to Diagnosis, Current Treatment, and Prognosis:  Patient's wife expressed concern about being able to take care of  his wound currently, feels that he would benefit from a wound nurse at SNF that could assist her in teaching how to care for it.   Emotional Assessment Appearance:  Appears stated age Attitude/Demeanor/Rapport:    Affect (typically observed):    Orientation:  Oriented to Self, Oriented to Place, Oriented to  Time, Oriented to Situation Alcohol / Substance use:    Psych involvement (Current and /or in the community):     Discharge Needs  Concerns to be addressed:    Readmission within the last 30 days:    Current discharge risk:    Barriers to Discharge:      Standley Brooking, LCSW 03/21/2016, 1:49 PM

## 2016-03-22 LAB — GLUCOSE, CAPILLARY
Glucose-Capillary: 166 mg/dL — ABNORMAL HIGH (ref 65–99)
Glucose-Capillary: 130 mg/dL — ABNORMAL HIGH (ref 65–99)
Glucose-Capillary: 241 mg/dL — ABNORMAL HIGH (ref 65–99)
Glucose-Capillary: 144 mg/dL — ABNORMAL HIGH (ref 65–99)

## 2016-03-22 MED ORDER — HYDROMORPHONE HCL 1 MG/ML IJ SOLN
1.0000 mg | INTRAMUSCULAR | Status: DC | PRN
  Administered 2016-03-22 – 2016-03-23 (×2): 1 mg via INTRAVENOUS
  Filled 2016-03-22 (×2): qty 1

## 2016-03-22 MED ORDER — ENOXAPARIN SODIUM 40 MG/0.4ML ~~LOC~~ SOLN
40.0000 mg | SUBCUTANEOUS | Status: DC
  Administered 2016-03-22 – 2016-03-25 (×4): 40 mg via SUBCUTANEOUS
  Filled 2016-03-22 (×4): qty 0.4

## 2016-03-22 NOTE — Progress Notes (Signed)
[facility] Progress Note  4 Days Post-Op  Subjective: Feeling better, dressing change this am went well  Objective: Vital signs in last 24 hours: Temp:  [97.8 F (36.6 C)-98.7 F (37.1 C)] 97.8 F (36.6 C) (10/07 0546) Pulse Rate:  [66-77] 66 (10/07 0546) Resp:  [16-18] 18 (10/07 0546) BP: (130-150)/(62-85) 130/85 (10/07 0546) SpO2:  [91 %-94 %] 92 % (10/07 0546) Last BM Date: 03/18/16  Intake/Output from previous day: 10/06 0701 - 10/07 0700 In: -  Out: 3850 [Urine:3850] Intake/Output this shift: No intake/output data recorded.  PE: Gen:  Alert, NAD, pleasant and cooperative Card:  RRR, no M/G/R heard Abd: soft GU: dressing dry and intact  Lab Results:   Recent Labs  03/20/16 0502  WBC 6.9  HGB 11.2*  HCT 35.1*  PLT 386   BMET No results for input(s): NA, K, CL, CO2, GLUCOSE, BUN, CREATININE, CALCIUM in the last 72 hours. CMP     Component Value Date/Time   NA 137 03/19/2016 0320   K 4.1 03/19/2016 0320   CL 98 (L) 03/19/2016 0320   CO2 35 (H) 03/19/2016 0320   GLUCOSE 154 (H) 03/19/2016 0320   BUN 6 03/19/2016 0320   CREATININE 0.64 03/19/2016 0320   CALCIUM 8.3 (L) 03/19/2016 0320   PROT 4.3 (L) 03/15/2016 0122   ALBUMIN 1.9 (L) 03/15/2016 0122   AST 22 03/15/2016 0122   ALT 10 (L) 03/15/2016 0122   ALKPHOS 39 03/15/2016 0122   BILITOT 1.2 03/15/2016 0122   GFRNONAA >60 03/19/2016 0320   GFRAA >60 03/19/2016 0320   Anti-infectives: Anti-infectives    Start     Dose/Rate Route Frequency Ordered Stop   03/20/16 1500  sulfamethoxazole-trimethoprim (BACTRIM DS,SEPTRA DS) 800-160 MG per tablet 1 tablet     1 tablet Oral Every 12 hours 03/20/16 1248     03/16/16 1600  vancomycin (VANCOCIN) IVPB 1000 mg/200 mL premix  Status:  Discontinued     1,000 mg 200 mL/hr over 60 Minutes Intravenous Every 8 hours 03/16/16 1131 03/20/16 1248   03/14/16 2200  vancomycin (VANCOCIN) IVPB 750 mg/150 ml premix  Status:  Discontinued     750 mg 150  mL/hr over 60 Minutes Intravenous Every 12 hours 03/14/16 1220 03/16/16 1131   03/14/16 2000  piperacillin-tazobactam (ZOSYN) IVPB 3.375 g  Status:  Discontinued     3.375 g 12.5 mL/hr over 240 Minutes Intravenous Every 8 hours 03/14/16 1218 03/20/16 1248   03/14/16 1520  polymyxin B 500,000 Units, bacitracin 50,000 Units in sodium chloride irrigation 0.9 % 500 mL irrigation  Status:  Discontinued       As needed 03/14/16 1521 03/14/16 1603   03/14/16 1045  vancomycin (VANCOCIN) IVPB 1000 mg/200 mL premix     1,000 mg 200 mL/hr over 60 Minutes Intravenous  Once 03/14/16 1033 03/14/16 1348   03/14/16 1030  clindamycin (CLEOCIN) IVPB 900 mg     900 mg 100 mL/hr over 30 Minutes Intravenous  Once 03/14/16 1019 03/14/16 1246   03/14/16 1030  piperacillin-tazobactam (ZOSYN) IVPB 3.375 g     3.375 g 100 mL/hr over 30 Minutes Intravenous  Once 03/14/16 1027 03/14/16 1304     Assessment/Plan Assessment/Plan Fournier's Gangrene    S/p Irrigation and debridement 03/15/16, Dr. Harlow Asa and Dr. Lorra Hals    Dressing change in [place], limited debridement 03/16/16, Dr. Harlow Asa and Dr. Lorra Hals    Debridement and partial closure perineal wound 03/18/16.  FEN: Regular  ID: Vancomycin, Zosyn: consider d/c  Foley out per urology DVT prohylaxis: restarted Lovenox  Plan: Cont to pre-medicate with 1mg  IV dilaudid prior to BID wet-to-dry dressing changes.  Hopefully can switch to PO meds prior to dressing changes soon.    LOS: 8 days    Rosario Adie, MD  Colorectal and [facility]

## 2016-03-22 NOTE — Progress Notes (Signed)
PROGRESS NOTE                                                                                                                                                                                                             Patient Demographics:    Jonathan Tucker, is a 65 y.o. male, DOB - [birth year], XN:[id number]  Admit date - 03/14/2016   Admitting Physician Murlean Iba, MD  Outpatient Primary MD for the patient is [name],[name], PA-C  LOS - 8  Outpatient Specialists:none  Chief Complaint  Patient presents with  . Groin Swelling       Brief Narrative   65 year old male with type 2 diabetes mellitus, dyslipidemia, hypertension was referred for admission by urology with concern for Fournier's gangrene. Patient was septic in the ED with hypotension (BP 62/29 mmHg) cardiac to 110s and tachypnea. He was afebrile. Blood work showed leukocytosis with WBC of 17, sodium of 124 and lactic acid of 3. CT scan was concerning for Fournier's gangrene. Patient seen by surgery and underwent I&D of the perineal and scrotal abscess on 9/29 and 10/1 followed by debridement of the gangrene and aspiration of hydrocele fluid with partial closure of the gangrene on 10/3.   Subjective:   Pain slowly improving. Was able to sit on the side of the bed again this morning.   Assessment  & Plan :    Principal Problem: Severe sepsis secondary to Fournier's gangrene multiple debridements by surgery and urology.  Blood and scrotal abscess culture negative. Now on oral Bactrim (treat for total 2 weeks;stop date 10/13) Pain control with hydrocodone and Dilaudid as needed. Will reduce Dilaudid dose. Continue Foley catheter.  Active Problems: Acute hypoxic respiratory failure (HCC) Following I&D secondary to pulmonary vascular congestion. 2-D echo on 10/3 showing EF of 55 and 60% with no wall motion abnormality. Currently stable.  New onset A. fib  with RVR Suspected this is associated with sepsis. Heart rate improved after metoprolol dose increased. Continue aspirin. CHADS2vasc score of 1, no need for anticoagulation. 2-D echo reviewed. Appreciate cardiology evaluation.  Hydrocele of scrotum Aspirated on 10/3.    Uncontrolled diabetes mellitus ([place]) Worsened with sepsis. A1c of 6.5. Now stable on low dose Lantus with pre-meal aspart.    Hyponatremia Secondary to dehydration and sepsis. Now resolved.  Acute kidney injury ([place]) Secondary to sepsis.  Resolved.    Acute blood loss anemia Currently stable.  Dyslipidemia Continue home medications  Essential hypertension On metoprolol.          Code Status : Full code  Family Communication  : Wife at bedside  Disposition Plan  : SNF early next week once pain better controlled  Barriers For Discharge : Requiring IV pain meds.  Consults  :   Surgery Neurology Cardiology  Procedures  :  I&D of the perineal and scrotal abscess on 9/29, 10/1 and 10/4  DVT Prophylaxis  : Lovenox  Lab Results  Component Value Date   PLT 386 03/20/2016    Antibiotics  :   Anti-infectives    Start     Dose/Rate Route Frequency Ordered Stop   03/20/16 1500  sulfamethoxazole-trimethoprim (BACTRIM DS,SEPTRA DS) 800-160 MG per tablet 1 tablet     1 tablet Oral Every 12 hours 03/20/16 1248     03/16/16 1600  vancomycin (VANCOCIN) IVPB 1000 mg/200 mL premix  Status:  Discontinued     1,000 mg 200 mL/hr over 60 Minutes Intravenous Every 8 hours 03/16/16 1131 03/20/16 1248   03/14/16 2200  vancomycin (VANCOCIN) IVPB 750 mg/150 ml premix  Status:  Discontinued     750 mg 150 mL/hr over 60 Minutes Intravenous Every 12 hours 03/14/16 1220 03/16/16 1131   03/14/16 2000  piperacillin-tazobactam (ZOSYN) IVPB 3.375 g  Status:  Discontinued     3.375 g 12.5 mL/hr over 240 Minutes Intravenous Every 8 hours 03/14/16 1218 03/20/16 1248   03/14/16 1520  polymyxin B 500,000 Units, bacitracin  50,000 Units in sodium chloride irrigation 0.9 % 500 mL irrigation  Status:  Discontinued       As needed 03/14/16 1521 03/14/16 1603   03/14/16 1045  vancomycin (VANCOCIN) IVPB 1000 mg/200 mL premix     1,000 mg 200 mL/hr over 60 Minutes Intravenous  Once 03/14/16 1033 03/14/16 1348   03/14/16 1030  clindamycin (CLEOCIN) IVPB 900 mg     900 mg 100 mL/hr over 30 Minutes Intravenous  Once 03/14/16 1019 03/14/16 1246   03/14/16 1030  piperacillin-tazobactam (ZOSYN) IVPB 3.375 g     3.375 g 100 mL/hr over 30 Minutes Intravenous  Once 03/14/16 1027 03/14/16 1304        Objective:   Vitals:   03/21/16 1055 03/21/16 1400 03/21/16 2142 03/22/16 0546  BP: (!) 150/62 (!) 142/76 138/82 130/85  Pulse:  68 77 66  Resp:   16 18  Temp:  98.5 F (36.9 C) 98.7 F (37.1 C) 97.8 F (36.6 C)  TempSrc:  Oral Oral Oral  SpO2:  91% 94% 92%  Weight:      Height:        Wt Readings from Last 3 Encounters:  03/14/16 86.2 kg (190 lb)     Intake/Output Summary (Last 24 hours) at 03/22/16 1215 Last data filed at 03/22/16 0846  Gross per 24 hour  Intake              240 ml  Output             3350 ml  Net            -3110 ml     Physical Exam  Gen: not in distress HEENT: moist mucosa, supple neck Chest: clear b/l, no added sounds CVS: s1&s2 No murmurs GI: soft, NT, ND, Dressing over scrotum, Foley catheter+ Musculoskeletal: warm, no edema     Data Review:  CBC  Recent Labs Lab 03/16/16 0317 03/17/16 0306 03/18/16 1900 03/19/16 0320 03/20/16 0502  WBC 14.5* 12.2* 12.0* 10.0 6.9  HGB 10.9* 10.6* 12.4* 11.4* 11.2*  HCT 32.0* 31.7* 38.3* 35.0* 35.1*  PLT 228 300 377 407* 386  MCV 91.4 92.4 94.3 94.9 95.4  MCH 31.1 30.9 30.5 30.9 30.4  MCHC 34.1 33.4 32.4 32.6 31.9  RDW 14.1 14.4 14.4 14.5 14.3    Chemistries   Recent Labs Lab 03/16/16 0317 03/18/16 0314 03/18/16 1900 03/19/16 0320  NA 135 137 139 137  K 3.7 3.9 4.2 4.1  CL 104 101 101 98*  CO2 24 31 32 35*    GLUCOSE 130* 153* 145* 154*  BUN 11 6 6 6   CREATININE 0.67 0.74 0.62 0.64  CALCIUM 7.4* 8.1* 8.4* 8.3*   ------------------------------------------------------------------------------------------------------------------ No results for input(s): CHOL, HDL, LDLCALC, TRIG, CHOLHDL, LDLDIRECT in the last 72 hours.  Lab Results  Component Value Date   HGBA1C 6.5 (H) 03/14/2016   ------------------------------------------------------------------------------------------------------------------ No results for input(s): TSH, T4TOTAL, T3FREE, THYROIDAB in the last 72 hours.  Invalid input(s): FREET3 ------------------------------------------------------------------------------------------------------------------ No results for input(s): VITAMINB12, FOLATE, FERRITIN, TIBC, IRON, RETICCTPCT in the last 72 hours.  Coagulation profile No results for input(s): INR, PROTIME in the last 168 hours.  No results for input(s): DDIMER in the last 72 hours.  Cardiac Enzymes No results for input(s): CKMB, TROPONINI, MYOGLOBIN in the last 168 hours.  Invalid input(s): CK ------------------------------------------------------------------------------------------------------------------ No results found for: BNP  Inpatient Medications  Scheduled Meds: . aspirin EC  325 mg Oral QPM  . colesevelam  1,250 mg Oral BID WC  . enoxaparin (LOVENOX) injection  40 mg Subcutaneous Q24H  . fenofibrate  160 mg Oral Daily  .  HYDROmorphone (DILAUDID) injection  1 mg Intravenous BID  . insulin aspart  0-9 Units Subcutaneous TID WC  . insulin aspart  3 Units Subcutaneous TID WC  . insulin glargine  8 Units Subcutaneous Daily  . metoprolol tartrate  50 mg Oral BID  . rosuvastatin  5 mg Oral QPM  . sulfamethoxazole-trimethoprim  1 tablet Oral Q12H   Continuous Infusions:  PRN Meds:.acetaminophen **OR** acetaminophen, haloperidol lactate, HYDROcodone-acetaminophen, HYDROmorphone (DILAUDID) injection,  ipratropium-albuterol, ondansetron **OR** ondansetron (ZOFRAN) IV, senna-docusate, zolpidem  Micro Results Recent Results (from the past 240 hour(s))  Blood Culture (routine x 2)     Status: None   Collection Time: 03/14/16 10:34 AM  Result Value Ref Range Status   Specimen Description BLOOD LEFT ANTECUBITAL  Final   Special Requests BOTTLES DRAWN AEROBIC AND ANAEROBIC 5ML  Final   Culture   Final    NO GROWTH 5 DAYS Performed at [facility]    Report Status 03/19/2016 FINAL  Final  Blood Culture (routine x 2)     Status: None   Collection Time: 03/14/16 10:46 AM  Result Value Ref Range Status   Specimen Description BLOOD LEFT HAND  Final   Special Requests BOTTLES DRAWN AEROBIC AND ANAEROBIC 5ML  Final   Culture   Final    NO GROWTH 5 DAYS Performed at [facility]    Report Status 03/19/2016 FINAL  Final  Anaerobic culture     Status: None   Collection Time: 03/14/16  3:01 PM  Result Value Ref Range Status   Specimen Description   Final    ABSCESS IRRIGATION AND DEBRIDEMENT SCROTAL ABCESS AND CYTOSCOPY   Special Requests NONE  Final   Culture   Final    MIXED ANAEROBIC  FLORA PRESENT.  CALL LAB IF FURTHER IID REQUIRED.   Report Status 03/19/2016 FINAL  Final  Aerobic Culture (superficial specimen)     Status: None   Collection Time: 03/14/16  3:01 PM  Result Value Ref Range Status   Specimen Description   Final    ABSCESS IRRIGATION AND DEBRIDEMENT SCROTAL ABCESS AND CYTOSCOPY   Special Requests NONE  Final   Gram Stain   Final    FEW WBC PRESENT, PREDOMINANTLY MONONUCLEAR ABUNDANT GRAM NEGATIVE RODS FEW GRAM POSITIVE COCCI IN PAIRS FEW GRAM POSITIVE RODS    Culture   Final    NORMAL SKIN FLORA Performed at [facility]    Report Status 03/17/2016 FINAL  Final  Fungus Stain     Status: None   Collection Time: 03/14/16  3:01 PM  Result Value Ref Range Status   FUNGUS STAIN Final report  Final    Comment: (NOTE) Performed At: [facility] [place], [place] [id number] Lindon Romp MD [id number]    Fungal Source ABSCESS  Final    Comment: IRRIGATION AND DEBRIDEMENT SCROTAL ABCESS AND CYTOSCOPY  Fungal Stain reflex     Status: None   Collection Time: 03/14/16  3:01 PM  Result Value Ref Range Status   Fungal stain result 1 Comment  Final    Comment: (NOTE) KOH/Calcofluor preparation:  no fungus observed. Performed At: [facility] [place], [place] [id number] Lindon Romp MD [id number]   MRSA PCR Screening     Status: None   Collection Time: 03/14/16  5:32 PM  Result Value Ref Range Status   MRSA by PCR NEGATIVE NEGATIVE Final    Comment:        The GeneXpert MRSA Assay (FDA approved for NASAL specimens only), is one component of a comprehensive MRSA colonization surveillance program. It is not intended to diagnose MRSA infection nor to guide or monitor treatment for MRSA infections.   Urine culture     Status: None   Collection Time: 03/14/16  8:02 PM  Result Value Ref Range Status   Specimen Description URINE, CATHETERIZED  Final   Special Requests NONE  Final   Culture NO GROWTH Performed at [facility]   Final   Report Status 03/16/2016 FINAL  Final    Radiology Reports Ct Abdomen Pelvis W Contrast  Result Date: 03/14/2016 CLINICAL DATA:  65 year old male with diabetes mellitus and scrotal infection with fever and erythema spreading to the right buttock. Clinical concern for Fournier's gangrene. EXAM: CT ABDOMEN AND PELVIS WITH CONTRAST TECHNIQUE: Multidetector CT imaging of the abdomen and pelvis was performed using the standard protocol following bolus administration of intravenous contrast. CONTRAST:  155mL ISOVUE-300 IOPAMIDOL (ISOVUE-300) INJECTION 61% COMPARISON:  None. FINDINGS: Lower chest: No significant pulmonary nodules or acute consolidative airspace disease. Centrilobular emphysema at the lung bases. Coronary atherosclerosis.  Hepatobiliary: Diffuse hepatic steatosis. No liver mass. Normal gallbladder with no radiopaque cholelithiasis. No biliary ductal dilatation. Pancreas: Normal, with no mass or duct dilation. Spleen: Normal size. No mass. Adrenals/Urinary Tract: Normal adrenals. No hydronephrosis. Hypodense 0.6 cm renal cortical lesion in the lateral interpolar left kidney, too small to characterize, for which no further follow-up is required. No additional renal lesions. Normal bladder. Stomach/Bowel: Grossly normal stomach. Normal caliber small bowel with no small bowel wall thickening. Normal appendix. Normal large bowel with no diverticulosis, large bowel wall thickening or pericolonic fat stranding. Vascular/Lymphatic: Atherosclerotic nonaneurysmal abdominal aorta. Patent portal, splenic, hepatic and  renal veins. Bilateral inguinal adenopathy measuring up to 2.1 cm on the right (series 2/ image 78) and 2.0 cm on the left (series 2/ image 77). No additional pathologically enlarged abdominopelvic nodes. Reproductive: Normal size prostate with nonspecific internal prostatic calcifications. Symmetric normal size seminal vesicles. Large right hydrocele measuring 7.5 x 6.4 cm (series 3/image 18). No left hydrocele. Severe thickening of the skin and subcutaneous soft tissues of the entire scrotum. Skin thickening, subcutaneous fat stranding and extensive subcutaneous emphysema throughout the perineum and bilateral medial buttocks, extending into the right ischial rectal fossa and deep right upper scrotal soft tissues. Subcutaneous abscess in the right perineum measuring 4.1 x 2.7 x 3.1 cm (series 2/image 99), demonstrating air-fluid level and thick enhancing wall. No additional focal drainable fluid collections in the abdomen or pelvis. Other: No pneumoperitoneum, ascites or focal fluid collection. Musculoskeletal: No aggressive appearing focal osseous lesions. Mild-to-moderate thoracolumbar spondylosis. IMPRESSION: 1. CT findings  consistent with Fournier's gangrene, with extensive skin thickening, subcutaneous fat stranding and subcutaneous emphysema throughout the perineum, bilateral medial buttocks, right ischiorectal fossa and scrotum. 2. Subcutaneous abscess in the right perineum. Large right hydrocele. No ascites or intraperitoneal gas or fluid collections. 3. Additional findings include aortic atherosclerosis, coronary atherosclerosis, centrilobular emphysema and diffuse hepatic steatosis. These results were called by telephone at the time of interpretation on 03/14/2016 at 12:29 pm to Dr. Addison Lank , who verbally acknowledged these results. Electronically Signed   By: [name] M.D.   On: 03/14/2016 12:32   Dg Chest Port 1 View  Result Date: 03/16/2016 CLINICAL DATA:  Post I&D today now with confusion, hypoxia and hypotension. EXAM: PORTABLE CHEST 1 VIEW COMPARISON:  03/12/2016; 06/27/2010 FINDINGS: Examination is degraded secondary to exclusion of the left costophrenic angle. Grossly unchanged enlarged cardiac silhouette and mediastinal contours given reduced lung volumes. Atherosclerotic plaque within the thoracic aorta. Pulmonary venous congestion without frank evidence of edema. Worsening bilateral infrahilar opacities favored to represent atelectasis. No discrete focal airspace opacities. No definite pleural effusion, though note, the left costophrenic angle is excluded from view. No pneumothorax. No acute osseus abnormalities. IMPRESSION: Cardiomegaly and pulmonary venous congestion without definitive superimposed acute cardiopulmonary disease on these degraded AP portable examinations. Further evaluation with a PA and lateral chest radiographs may be obtained as clinically indicated. Electronically Signed   By: Sandi Mariscal M.D.   On: 03/16/2016 14:57   Dg Chest Port 1 View  Result Date: 03/14/2016 CLINICAL DATA:  Sepsis.  Smoker. EXAM: PORTABLE CHEST 1 VIEW COMPARISON:  06/27/2010 . FINDINGS: Mediastinum and hilar  structures normal. Mild cardiomegaly. Mild right base infiltrate cannot be excluded. No pleural effusion or pneumothorax . No acute bony abnormality. IMPRESSION: 1. Mild right base infiltrate cannot be excluded. 2. Mild cardiomegaly. Electronically Signed   By: Marcello Moores  Register   On: 03/14/2016 12:40    Time Spent in minutes  25   Louellen Molder M.D on 03/22/2016 at 12:15 PM  Between 7am to 7pm - Pager - [phone]  After 7pm go to [email or web address] - password [email or web address]  [facility] -  Office  [phone]

## 2016-03-23 LAB — GLUCOSE, CAPILLARY
GLUCOSE-CAPILLARY: 153 mg/dL — AB (ref 65–99)
GLUCOSE-CAPILLARY: 134 mg/dL — AB (ref 65–99)
GLUCOSE-CAPILLARY: 193 mg/dL — AB (ref 65–99)
Glucose-Capillary: 106 mg/dL — ABNORMAL HIGH (ref 65–99)

## 2016-03-23 MED ORDER — HYDROMORPHONE HCL 1 MG/ML IJ SOLN
1.0000 mg | Freq: Once | INTRAMUSCULAR | Status: AC
  Administered 2016-03-23: 1 mg via INTRAVENOUS

## 2016-03-23 MED ORDER — OXYCODONE HCL 5 MG PO TABS
5.0000 mg | ORAL_TABLET | ORAL | Status: DC | PRN
  Administered 2016-03-24 – 2016-03-25 (×3): 5 mg via ORAL
  Filled 2016-03-23 (×3): qty 1

## 2016-03-23 MED ORDER — HYDROMORPHONE HCL 1 MG/ML IJ SOLN
1.0000 mg | Freq: Four times a day (QID) | INTRAMUSCULAR | Status: DC | PRN
  Filled 2016-03-23 (×3): qty 1

## 2016-03-23 MED ORDER — HYDROMORPHONE HCL 1 MG/ML IJ SOLN
1.0000 mg | Freq: Two times a day (BID) | INTRAMUSCULAR | Status: DC | PRN
  Administered 2016-03-23 – 2016-03-24 (×3): 1 mg via INTRAVENOUS
  Filled 2016-03-23: qty 1

## 2016-03-23 NOTE — Progress Notes (Signed)
PROGRESS NOTE                                                                                                                                                                                                             Patient Demographics:    Jonathan Tucker, is a 65 y.o. male, DOB - Aug 24, 1950, [id number]  Admit date - 03/14/2016   Admitting Physician Murlean Iba, MD  Outpatient Primary MD for the patient is [name],[name], PA-C  LOS - 9  Outpatient Specialists:none  Chief Complaint  Patient presents with  . Groin Swelling       Brief Narrative   65 year old male with type 2 diabetes mellitus, dyslipidemia, hypertension was referred for admission by urology with concern for Fournier's gangrene. Patient was septic in the ED with hypotension (BP 62/29 mmHg) cardiac to 110s and tachypnea. He was afebrile. Blood work showed leukocytosis with WBC of 17, sodium of 124 and lactic acid of 3. CT scan was concerning for Fournier's gangrene. Patient seen by surgery and underwent I&D of the perineal and scrotal abscess on 9/29 and 10/1 followed by debridement of the gangrene and aspiration of hydrocele fluid with partial closure of the gangrene on 10/3.   Subjective:   Pain getting better. Patient able to sit on the side of bed for longer duration.   Assessment  & Plan :    Principal Problem: Severe sepsis secondary to Fournier's gangrene multiple debridements by surgery and urology.  Blood and scrotal abscess culture negative. Now on oral Bactrim (treat for total 2 weeks;stop date 10/13) Pain control primarily with hydrocodone, use Dilaudid as needed and before dressing changes.  Continue Foley catheter.  Active Problems: Acute hypoxic respiratory failure (HCC) Following I&D secondary to pulmonary vascular congestion. 2-D echo on 10/3 showing EF of 55 and 60% with no wall motion abnormality. Currently stable.  New  onset A. fib with RVR Suspected this is associated with sepsis. Heart rate stable after metoprolol dose increased. Continue aspirin. CHADS2vasc score of 1, no need for anticoagulation. 2-D echo reviewed. Appreciate cardiology evaluation.  Hydrocele of scrotum Aspirated on 10/3.    Uncontrolled diabetes mellitus ([place]) Worsened with sepsis. A1c of 6.5. Now stable on low dose Lantus with pre-meal aspart.    Hyponatremia Secondary to dehydration and sepsis. Now resolved.  Acute kidney injury ([place]) Secondary to  sepsis. Resolved.    Acute blood loss anemia Currently stable.  Dyslipidemia Continue home medications  Essential hypertension On metoprolol.          Code Status : Full code  Family Communication  : Wife at bedside  Disposition Plan  : SNF possibly tomorrow.  Barriers For Discharge : Requiring IV pain meds.  Consults  :   Surgery Neurology Cardiology  Procedures  :  I&D of the perineal and scrotal abscess on 9/29, 10/1 and 10/4  DVT Prophylaxis  : Lovenox  Lab Results  Component Value Date   PLT 386 03/20/2016    Antibiotics  :   Anti-infectives    Start     Dose/Rate Route Frequency Ordered Stop   03/20/16 1500  sulfamethoxazole-trimethoprim (BACTRIM DS,SEPTRA DS) 800-160 MG per tablet 1 tablet     1 tablet Oral Every 12 hours 03/20/16 1248     03/16/16 1600  vancomycin (VANCOCIN) IVPB 1000 mg/200 mL premix  Status:  Discontinued     1,000 mg 200 mL/hr over 60 Minutes Intravenous Every 8 hours 03/16/16 1131 03/20/16 1248   03/14/16 2200  vancomycin (VANCOCIN) IVPB 750 mg/150 ml premix  Status:  Discontinued     750 mg 150 mL/hr over 60 Minutes Intravenous Every 12 hours 03/14/16 1220 03/16/16 1131   03/14/16 2000  piperacillin-tazobactam (ZOSYN) IVPB 3.375 g  Status:  Discontinued     3.375 g 12.5 mL/hr over 240 Minutes Intravenous Every 8 hours 03/14/16 1218 03/20/16 1248   03/14/16 1520  polymyxin B 500,000 Units, bacitracin 50,000 Units in  sodium chloride irrigation 0.9 % 500 mL irrigation  Status:  Discontinued       As needed 03/14/16 1521 03/14/16 1603   03/14/16 1045  vancomycin (VANCOCIN) IVPB 1000 mg/200 mL premix     1,000 mg 200 mL/hr over 60 Minutes Intravenous  Once 03/14/16 1033 03/14/16 1348   03/14/16 1030  clindamycin (CLEOCIN) IVPB 900 mg     900 mg 100 mL/hr over 30 Minutes Intravenous  Once 03/14/16 1019 03/14/16 1246   03/14/16 1030  piperacillin-tazobactam (ZOSYN) IVPB 3.375 g     3.375 g 100 mL/hr over 30 Minutes Intravenous  Once 03/14/16 1027 03/14/16 1304        Objective:   Vitals:   03/22/16 0546 03/22/16 1530 03/22/16 2226 03/23/16 0545  BP: 130/85 106/65 118/73 108/71  Pulse: 66 66 71 65  Resp: 18 18 18 16   Temp: 97.8 F (36.6 C) 98 F (36.7 C) 98.1 F (36.7 C) 98 F (36.7 C)  TempSrc: Oral Oral Oral Oral  SpO2: 92% 93% 92% 92%  Weight:      Height:        Wt Readings from Last 3 Encounters:  03/14/16 86.2 kg (190 lb)     Intake/Output Summary (Last 24 hours) at 03/23/16 1309 Last data filed at 03/23/16 0925  Gross per 24 hour  Intake              240 ml  Output             3450 ml  Net            -3210 ml     Physical Exam  Gen: not in distress HEENT: moist mucosa, supple neck Chest: clear b/l, CVS: s1&s2 No murmurs GI: soft, NT, Dressing over scrotum, Foley catheter+ Musculoskeletal: warm, no edema     Data Review:    CBC  Recent Labs Lab 03/17/16 0306  03/18/16 1900 03/19/16 0320 03/20/16 0502  WBC 12.2* 12.0* 10.0 6.9  HGB 10.6* 12.4* 11.4* 11.2*  HCT 31.7* 38.3* 35.0* 35.1*  PLT 300 377 407* 386  MCV 92.4 94.3 94.9 95.4  MCH 30.9 30.5 30.9 30.4  MCHC 33.4 32.4 32.6 31.9  RDW 14.4 14.4 14.5 14.3    Chemistries   Recent Labs Lab 03/18/16 0314 03/18/16 1900 03/19/16 0320  NA 137 139 137  K 3.9 4.2 4.1  CL 101 101 98*  CO2 31 32 35*  GLUCOSE 153* 145* 154*  BUN 6 6 6   CREATININE 0.74 0.62 0.64  CALCIUM 8.1* 8.4* 8.3*    ------------------------------------------------------------------------------------------------------------------ No results for input(s): CHOL, HDL, LDLCALC, TRIG, CHOLHDL, LDLDIRECT in the last 72 hours.  Lab Results  Component Value Date   HGBA1C 6.5 (H) 03/14/2016   ------------------------------------------------------------------------------------------------------------------ No results for input(s): TSH, T4TOTAL, T3FREE, THYROIDAB in the last 72 hours.  Invalid input(s): FREET3 ------------------------------------------------------------------------------------------------------------------ No results for input(s): VITAMINB12, FOLATE, FERRITIN, TIBC, IRON, RETICCTPCT in the last 72 hours.  Coagulation profile No results for input(s): INR, PROTIME in the last 168 hours.  No results for input(s): DDIMER in the last 72 hours.  Cardiac Enzymes No results for input(s): CKMB, TROPONINI, MYOGLOBIN in the last 168 hours.  Invalid input(s): CK ------------------------------------------------------------------------------------------------------------------ No results found for: BNP  Inpatient Medications  Scheduled Meds: . aspirin EC  325 mg Oral QPM  . colesevelam  1,250 mg Oral BID WC  . enoxaparin (LOVENOX) injection  40 mg Subcutaneous Q24H  . fenofibrate  160 mg Oral Daily  . insulin aspart  0-9 Units Subcutaneous TID WC  . insulin aspart  3 Units Subcutaneous TID WC  . insulin glargine  8 Units Subcutaneous Daily  . metoprolol tartrate  50 mg Oral BID  . rosuvastatin  5 mg Oral QPM  . sulfamethoxazole-trimethoprim  1 tablet Oral Q12H   Continuous Infusions:  PRN Meds:.acetaminophen **OR** acetaminophen, haloperidol lactate, HYDROmorphone (DILAUDID) injection, HYDROmorphone (DILAUDID) injection, ipratropium-albuterol, ondansetron **OR** ondansetron (ZOFRAN) IV, oxyCODONE, senna-docusate, zolpidem  Micro Results Recent Results (from the past 240 hour(s))  Blood  Culture (routine x 2)     Status: None   Collection Time: 03/14/16 10:34 AM  Result Value Ref Range Status   Specimen Description BLOOD LEFT ANTECUBITAL  Final   Special Requests BOTTLES DRAWN AEROBIC AND ANAEROBIC 5ML  Final   Culture   Final    NO GROWTH 5 DAYS Performed at [facility]    Report Status 03/19/2016 FINAL  Final  Blood Culture (routine x 2)     Status: None   Collection Time: 03/14/16 10:46 AM  Result Value Ref Range Status   Specimen Description BLOOD LEFT HAND  Final   Special Requests BOTTLES DRAWN AEROBIC AND ANAEROBIC 5ML  Final   Culture   Final    NO GROWTH 5 DAYS Performed at [facility]    Report Status 03/19/2016 FINAL  Final  Anaerobic culture     Status: None   Collection Time: 03/14/16  3:01 PM  Result Value Ref Range Status   Specimen Description   Final    ABSCESS IRRIGATION AND DEBRIDEMENT SCROTAL ABCESS AND CYTOSCOPY   Special Requests NONE  Final   Culture   Final    MIXED ANAEROBIC FLORA PRESENT.  CALL LAB IF FURTHER IID REQUIRED.   Report Status 03/19/2016 FINAL  Final  Aerobic Culture (superficial specimen)     Status: None   Collection Time: 03/14/16  3:01 PM  Result Value Ref Range Status   Specimen Description   Final    ABSCESS IRRIGATION AND DEBRIDEMENT SCROTAL ABCESS AND CYTOSCOPY   Special Requests NONE  Final   Gram Stain   Final    FEW WBC PRESENT, PREDOMINANTLY MONONUCLEAR ABUNDANT GRAM NEGATIVE RODS FEW GRAM POSITIVE COCCI IN PAIRS FEW GRAM POSITIVE RODS    Culture   Final    NORMAL SKIN FLORA Performed at [facility]    Report Status 03/17/2016 FINAL  Final  Fungus Stain     Status: None   Collection Time: 03/14/16  3:01 PM  Result Value Ref Range Status   FUNGUS STAIN Final report  Final    Comment: (NOTE) Performed At: [facility] [place], [place] [id number] Lindon Romp MD [id number]    Fungal Source ABSCESS  Final    Comment: IRRIGATION AND DEBRIDEMENT  SCROTAL ABCESS AND CYTOSCOPY  Fungal Stain reflex     Status: None   Collection Time: 03/14/16  3:01 PM  Result Value Ref Range Status   Fungal stain result 1 Comment  Final    Comment: (NOTE) KOH/Calcofluor preparation:  no fungus observed. Performed At: [facility] [place], [place] [id number] Lindon Romp MD [id number]   MRSA PCR Screening     Status: None   Collection Time: 03/14/16  5:32 PM  Result Value Ref Range Status   MRSA by PCR NEGATIVE NEGATIVE Final    Comment:        The GeneXpert MRSA Assay (FDA approved for NASAL specimens only), is one component of a comprehensive MRSA colonization surveillance program. It is not intended to diagnose MRSA infection nor to guide or monitor treatment for MRSA infections.   Urine culture     Status: None   Collection Time: 03/14/16  8:02 PM  Result Value Ref Range Status   Specimen Description URINE, CATHETERIZED  Final   Special Requests NONE  Final   Culture NO GROWTH Performed at [facility]   Final   Report Status 03/16/2016 FINAL  Final    Radiology Reports Ct Abdomen Pelvis W Contrast  Result Date: 03/14/2016 CLINICAL DATA:  65 year old male with diabetes mellitus and scrotal infection with fever and erythema spreading to the right buttock. Clinical concern for Fournier's gangrene. EXAM: CT ABDOMEN AND PELVIS WITH CONTRAST TECHNIQUE: Multidetector CT imaging of the abdomen and pelvis was performed using the standard protocol following bolus administration of intravenous contrast. CONTRAST:  129mL ISOVUE-300 IOPAMIDOL (ISOVUE-300) INJECTION 61% COMPARISON:  None. FINDINGS: Lower chest: No significant pulmonary nodules or acute consolidative airspace disease. Centrilobular emphysema at the lung bases. Coronary atherosclerosis. Hepatobiliary: Diffuse hepatic steatosis. No liver mass. Normal gallbladder with no radiopaque cholelithiasis. No biliary ductal dilatation. Pancreas: Normal, with  no mass or duct dilation. Spleen: Normal size. No mass. Adrenals/Urinary Tract: Normal adrenals. No hydronephrosis. Hypodense 0.6 cm renal cortical lesion in the lateral interpolar left kidney, too small to characterize, for which no further follow-up is required. No additional renal lesions. Normal bladder. Stomach/Bowel: Grossly normal stomach. Normal caliber small bowel with no small bowel wall thickening. Normal appendix. Normal large bowel with no diverticulosis, large bowel wall thickening or pericolonic fat stranding. Vascular/Lymphatic: Atherosclerotic nonaneurysmal abdominal aorta. Patent portal, splenic, hepatic and renal veins. Bilateral inguinal adenopathy measuring up to 2.1 cm on the right (series 2/ image 78) and 2.0 cm on the left (series 2/ image 77). No additional pathologically enlarged abdominopelvic nodes. Reproductive: Normal size prostate  with nonspecific internal prostatic calcifications. Symmetric normal size seminal vesicles. Large right hydrocele measuring 7.5 x 6.4 cm (series 3/image 18). No left hydrocele. Severe thickening of the skin and subcutaneous soft tissues of the entire scrotum. Skin thickening, subcutaneous fat stranding and extensive subcutaneous emphysema throughout the perineum and bilateral medial buttocks, extending into the right ischial rectal fossa and deep right upper scrotal soft tissues. Subcutaneous abscess in the right perineum measuring 4.1 x 2.7 x 3.1 cm (series 2/image 99), demonstrating air-fluid level and thick enhancing wall. No additional focal drainable fluid collections in the abdomen or pelvis. Other: No pneumoperitoneum, ascites or focal fluid collection. Musculoskeletal: No aggressive appearing focal osseous lesions. Mild-to-moderate thoracolumbar spondylosis. IMPRESSION: 1. CT findings consistent with Fournier's gangrene, with extensive skin thickening, subcutaneous fat stranding and subcutaneous emphysema throughout the perineum, bilateral medial  buttocks, right ischiorectal fossa and scrotum. 2. Subcutaneous abscess in the right perineum. Large right hydrocele. No ascites or intraperitoneal gas or fluid collections. 3. Additional findings include aortic atherosclerosis, coronary atherosclerosis, centrilobular emphysema and diffuse hepatic steatosis. These results were called by telephone at the time of interpretation on 03/14/2016 at 12:29 pm to Dr. Addison Lank , who verbally acknowledged these results. Electronically Signed   By: [name] M.D.   On: 03/14/2016 12:32   Dg Chest Port 1 View  Result Date: 03/16/2016 CLINICAL DATA:  Post I&D today now with confusion, hypoxia and hypotension. EXAM: PORTABLE CHEST 1 VIEW COMPARISON:  03/12/2016; 06/27/2010 FINDINGS: Examination is degraded secondary to exclusion of the left costophrenic angle. Grossly unchanged enlarged cardiac silhouette and mediastinal contours given reduced lung volumes. Atherosclerotic plaque within the thoracic aorta. Pulmonary venous congestion without frank evidence of edema. Worsening bilateral infrahilar opacities favored to represent atelectasis. No discrete focal airspace opacities. No definite pleural effusion, though note, the left costophrenic angle is excluded from view. No pneumothorax. No acute osseus abnormalities. IMPRESSION: Cardiomegaly and pulmonary venous congestion without definitive superimposed acute cardiopulmonary disease on these degraded AP portable examinations. Further evaluation with a PA and lateral chest radiographs may be obtained as clinically indicated. Electronically Signed   By: Sandi Mariscal M.D.   On: 03/16/2016 14:57   Dg Chest Port 1 View  Result Date: 03/14/2016 CLINICAL DATA:  Sepsis.  Smoker. EXAM: PORTABLE CHEST 1 VIEW COMPARISON:  06/27/2010 . FINDINGS: Mediastinum and hilar structures normal. Mild cardiomegaly. Mild right base infiltrate cannot be excluded. No pleural effusion or pneumothorax . No acute bony abnormality. IMPRESSION: 1.  Mild right base infiltrate cannot be excluded. 2. Mild cardiomegaly. Electronically Signed   By: Marcello Moores  Register   On: 03/14/2016 12:40    Time Spent in minutes  25   Louellen Molder M.D on 03/23/2016 at 1:09 PM  Between 7am to 7pm - Pager - [phone]  After 7pm go to [email or web address] - password [facility]  [facility] -  Office  [phone]

## 2016-03-23 NOTE — Progress Notes (Signed)
[facility] Progress Note  5 Days Post-Op  Subjective: Feeling better, dressing changes going well  Objective: Vital signs in last 24 hours: Temp:  [98 F (36.7 C)-98.1 F (36.7 C)] 98 F (36.7 C) (10/08 0545) Pulse Rate:  [65-71] 65 (10/08 0545) Resp:  [16-18] 16 (10/08 0545) BP: (106-118)/(65-73) 108/71 (10/08 0545) SpO2:  [92 %-93 %] 92 % (10/08 0545) Last BM Date: 03/19/16  Intake/Output from previous day: 10/07 0701 - 10/08 0700 In: 240 [P.O.:240] Out: 3450 [Urine:3450] Intake/Output this shift: No intake/output data recorded.  PE: Gen:  Alert, NAD, pleasant and cooperative Card:  RRR, no M/G/R heard Abd: soft GU: dressing dry and intact  Lab Results:  No results for input(s): WBC, HGB, HCT, PLT in the last 72 hours. BMET No results for input(s): NA, K, CL, CO2, GLUCOSE, BUN, CREATININE, CALCIUM in the last 72 hours. CMP     Component Value Date/Time   NA 137 03/19/2016 0320   K 4.1 03/19/2016 0320   CL 98 (L) 03/19/2016 0320   CO2 35 (H) 03/19/2016 0320   GLUCOSE 154 (H) 03/19/2016 0320   BUN 6 03/19/2016 0320   CREATININE 0.64 03/19/2016 0320   CALCIUM 8.3 (L) 03/19/2016 0320   PROT 4.3 (L) 03/15/2016 0122   ALBUMIN 1.9 (L) 03/15/2016 0122   AST 22 03/15/2016 0122   ALT 10 (L) 03/15/2016 0122   ALKPHOS 39 03/15/2016 0122   BILITOT 1.2 03/15/2016 0122   GFRNONAA >60 03/19/2016 0320   GFRAA >60 03/19/2016 0320   Anti-infectives: Anti-infectives    Start     Dose/Rate Route Frequency Ordered Stop   03/20/16 1500  sulfamethoxazole-trimethoprim (BACTRIM DS,SEPTRA DS) 800-160 MG per tablet 1 tablet     1 tablet Oral Every 12 hours 03/20/16 1248     03/16/16 1600  vancomycin (VANCOCIN) IVPB 1000 mg/200 mL premix  Status:  Discontinued     1,000 mg 200 mL/hr over 60 Minutes Intravenous Every 8 hours 03/16/16 1131 03/20/16 1248   03/14/16 2200  vancomycin (VANCOCIN) IVPB 750 mg/150 ml premix  Status:  Discontinued     750 mg 150 mL/hr over  60 Minutes Intravenous Every 12 hours 03/14/16 1220 03/16/16 1131   03/14/16 2000  piperacillin-tazobactam (ZOSYN) IVPB 3.375 g  Status:  Discontinued     3.375 g 12.5 mL/hr over 240 Minutes Intravenous Every 8 hours 03/14/16 1218 03/20/16 1248   03/14/16 1520  polymyxin B 500,000 Units, bacitracin 50,000 Units in sodium chloride irrigation 0.9 % 500 mL irrigation  Status:  Discontinued       As needed 03/14/16 1521 03/14/16 1603   03/14/16 1045  vancomycin (VANCOCIN) IVPB 1000 mg/200 mL premix     1,000 mg 200 mL/hr over 60 Minutes Intravenous  Once 03/14/16 1033 03/14/16 1348   03/14/16 1030  clindamycin (CLEOCIN) IVPB 900 mg     900 mg 100 mL/hr over 30 Minutes Intravenous  Once 03/14/16 1019 03/14/16 1246   03/14/16 1030  piperacillin-tazobactam (ZOSYN) IVPB 3.375 g     3.375 g 100 mL/hr over 30 Minutes Intravenous  Once 03/14/16 1027 03/14/16 1304     Assessment/Plan Assessment/Plan Fournier's Gangrene    S/p Irrigation and debridement 03/15/16, Dr. Harlow Asa and Dr. Lorra Hals    Dressing change in [place], limited debridement 03/16/16, Dr. Harlow Asa and Dr. Lorra Hals    Debridement and partial closure perineal wound 03/18/16.  FEN: Regular  ID: Vancomycin, Zosyn: consider d/c  DVT prohylaxis: restarted Lovenox  Plan: Will try oxycodone  to pre-medicate prior to BID wet-to-dry dressing changes.  Will have IV for back-up if needed   LOS: 9 days    Rosario Adie, MD  [facility]

## 2016-03-24 DIAGNOSIS — I480 Paroxysmal atrial fibrillation: Secondary | ICD-10-CM

## 2016-03-24 LAB — GLUCOSE, CAPILLARY
GLUCOSE-CAPILLARY: 124 mg/dL — AB (ref 65–99)
Glucose-Capillary: 183 mg/dL — ABNORMAL HIGH (ref 65–99)
Glucose-Capillary: 105 mg/dL — ABNORMAL HIGH (ref 65–99)
Glucose-Capillary: 168 mg/dL — ABNORMAL HIGH (ref 65–99)

## 2016-03-24 LAB — BASIC METABOLIC PANEL: GLUCOSE: 168 mg/dL

## 2016-03-24 MED ORDER — INSULIN GLARGINE 100 UNIT/ML ~~LOC~~ SOLN
12.0000 [IU] | Freq: Every day | SUBCUTANEOUS | Status: DC
  Administered 2016-03-25: 12 [IU] via SUBCUTANEOUS
  Filled 2016-03-24: qty 0.12

## 2016-03-24 NOTE — Progress Notes (Signed)
Scrotum looks good Gave him my number Voiding trial today Will see in office in about 2 weeks

## 2016-03-24 NOTE — Progress Notes (Addendum)
[facility] Progress Note  6 Days Post-Op  Subjective: No major changes, does not feel PO meds are adequate for dressing changes yet. BM overnight.   Objective: Vital signs in last 24 hours: Temp:  [98.4 F (36.9 C)-99 F (37.2 C)] 99 F (37.2 C) (10/09 0555) Pulse Rate:  [60-73] 60 (10/09 0555) Resp:  [16-18] 16 (10/09 0555) BP: (114-125)/(71-74) 114/74 (10/09 0555) SpO2:  [93 %-95 %] 95 % (10/09 0555) Last BM Date: 03/19/16  Intake/Output from previous day: 10/08 0701 - 10/09 0700 In: 480 [P.O.:480] Out: 2900 [Urine:2900] Intake/Output this shift: No intake/output data recorded.  PE: Gen:  Alert, NAD, pleasant and cooperative Card:  RRR, no M/G/R heard Abd: soft GU: dressing dry and intact  Lab Results:  No results for input(s): WBC, HGB, HCT, PLT in the last 72 hours. BMET No results for input(s): NA, K, CL, CO2, GLUCOSE, BUN, CREATININE, CALCIUM in the last 72 hours. CMP     Component Value Date/Time   NA 137 03/19/2016 0320   K 4.1 03/19/2016 0320   CL 98 (L) 03/19/2016 0320   CO2 35 (H) 03/19/2016 0320   GLUCOSE 154 (H) 03/19/2016 0320   BUN 6 03/19/2016 0320   CREATININE 0.64 03/19/2016 0320   CALCIUM 8.3 (L) 03/19/2016 0320   PROT 4.3 (L) 03/15/2016 0122   ALBUMIN 1.9 (L) 03/15/2016 0122   AST 22 03/15/2016 0122   ALT 10 (L) 03/15/2016 0122   ALKPHOS 39 03/15/2016 0122   BILITOT 1.2 03/15/2016 0122   GFRNONAA >60 03/19/2016 0320   GFRAA >60 03/19/2016 0320   Anti-infectives: Anti-infectives    Start     Dose/Rate Route Frequency Ordered Stop   03/20/16 1500  sulfamethoxazole-trimethoprim (BACTRIM DS,SEPTRA DS) 800-160 MG per tablet 1 tablet     1 tablet Oral Every 12 hours 03/20/16 1248     03/16/16 1600  vancomycin (VANCOCIN) IVPB 1000 mg/200 mL premix  Status:  Discontinued     1,000 mg 200 mL/hr over 60 Minutes Intravenous Every 8 hours 03/16/16 1131 03/20/16 1248   03/14/16 2200  vancomycin (VANCOCIN) IVPB 750 mg/150 ml premix   Status:  Discontinued     750 mg 150 mL/hr over 60 Minutes Intravenous Every 12 hours 03/14/16 1220 03/16/16 1131   03/14/16 2000  piperacillin-tazobactam (ZOSYN) IVPB 3.375 g  Status:  Discontinued     3.375 g 12.5 mL/hr over 240 Minutes Intravenous Every 8 hours 03/14/16 1218 03/20/16 1248   03/14/16 1520  polymyxin B 500,000 Units, bacitracin 50,000 Units in sodium chloride irrigation 0.9 % 500 mL irrigation  Status:  Discontinued       As needed 03/14/16 1521 03/14/16 1603   03/14/16 1045  vancomycin (VANCOCIN) IVPB 1000 mg/200 mL premix     1,000 mg 200 mL/hr over 60 Minutes Intravenous  Once 03/14/16 1033 03/14/16 1348   03/14/16 1030  clindamycin (CLEOCIN) IVPB 900 mg     900 mg 100 mL/hr over 30 Minutes Intravenous  Once 03/14/16 1019 03/14/16 1246   03/14/16 1030  piperacillin-tazobactam (ZOSYN) IVPB 3.375 g     3.375 g 100 mL/hr over 30 Minutes Intravenous  Once 03/14/16 1027 03/14/16 1304     Assessment/Plan Assessment/Plan Fournier's Gangrene    S/p Irrigation and debridement 03/15/16, Dr. Harlow Asa and Dr. Lorra Hals    Dressing change in [place], limited debridement 03/16/16, Dr. Harlow Asa and Dr. Lorra Hals    Debridement and partial closure perineal wound 03/18/16.  FEN: Regular  ID: Bactrim DVT prohylaxis:  restarted Lovenox  Plan: Continue dressing changes, with PO pain meds as able. Will return at 10:30 to see wound during planned dressing change.   LOS: 10 days    Clovis Riley, [facility]   Addendum 10:45: Wound is healthy appearing with no purulence or necrotic tissue. Some fibrinous exudate overlying granulation tissue bed. Isolated sutures reapproximating parts of the wound. Scrotum remains edematous. Patient encouraged to use only PO medications for tonight's dressing change. Likely DC tomorrow.

## 2016-03-24 NOTE — Progress Notes (Signed)
Nutrition Brief Note  Patient identified for LOS (10 days).   Wt Readings from Last 15 Encounters:  03/14/16 190 lb (86.2 kg)    Body mass index is 25.77 kg/m. Patient meets criteria for overweight based on current BMI. No new weight since admission. Incision to perineum from I&D on 03/18/16.   Current diet order is Regular. Pt mainly on Heart Healthy or Heart Healthy/Carb Modified diet since admission with 75-100% intakes on average.  Labs reviewed; CBGs: 106-193 mg/dL over the past 24 hours. Medications reviewed; sliding scale Novolog, 3 units Novolog TID, 8 units Lantus/day, PRN Zofran.   No nutrition interventions warranted at this time. If nutrition issues arise, please consult RD.     Jarome Matin, MS, RD, LDN Inpatient Clinical Dietitian Pager # [phone] After hours/weekend pager # [phone]

## 2016-03-24 NOTE — Progress Notes (Signed)
Physical Therapy Treatment Patient Details Name: Jonathan Tucker MRN: [id number] DOB: 10-08-1950 Today's Date: 03/24/2016    History of Present Illness 65 year old male with type 2 diabetes mellitus, dyslipidemia, hypertension was admitted 03/14/16 with concern  for Fournier's gangrene. S/p I and D 9/30 and partial closure 10/3.     PT Comments    Pt able to rise self OOB with increased time and wide BOS but required increased time due to prolonged hospital stay which included bed rest. Assisted with amb in hallway however required a walker for increased support and limited distance due to max c/o weakness/fatigue.  Pt also reports weight loss.  Prior pt [place]. Pt agrees to [place] at SNF prior to returning home.   Follow Up Recommendations  SNF     Equipment Recommendations  None recommended by PT    Recommendations for Other Services       Precautions / Restrictions Precautions Precaution Comments: scrotal swelling Restrictions Weight Bearing Restrictions: No    Mobility  Bed Mobility Overal bed mobility: Modified Independent   Rolling: Modified independent (Device/Increase time) Sidelying to sit: Modified independent (Device/Increase time)     Sit to sidelying: Modified independent (Device/Increase time) General bed mobility comments: increased time  Transfers Overall transfer level: Needs assistance Equipment used: None Transfers: Sit to/from [facility] Sit to Stand: Supervision;Min guard Stand pivot transfers: Supervision;Min guard       General transfer comment: use of hands to steady self.  Required increased time due to prolonged bedrest  Ambulation/Gait Ambulation/Gait assistance: Min assist Ambulation Distance (Feet): 46 Feet Assistive device: Rolling walker (2 wheeled) Gait Pattern/deviations: Step-to pattern;Step-through pattern;Wide base of support Gait velocity: decreased   General Gait Details: limited  distance due to MAX c/o weakness and fatigue level.  Prolonged hospital stay and weightloss with generalized weakness.    Stairs            Wheelchair Mobility    Modified Rankin (Stroke Patients Only)       Balance                                    Cognition Arousal/Alertness: Awake/alert Behavior During Therapy: WFL for tasks assessed/performed Overall Cognitive Status: Within Functional Limits for tasks assessed                      Exercises      General Comments        Pertinent Vitals/Pain Pain Assessment: Faces Faces Pain Scale: Hurts little more Pain Location: scrotal area Pain Descriptors / Indicators: Grimacing;Discomfort    Home Living                      Prior Function            PT Goals (current goals can now be found in the care plan section) Progress towards PT goals: Progressing toward goals    Frequency    Min 3X/week      PT Plan Current plan remains appropriate    Co-evaluation             End of Session Equipment Utilized During Treatment: Gait belt Activity Tolerance: Patient limited by fatigue Patient left: in bed;with call bell/phone within reach     Time: 1522-1546 PT Time Calculation (min) (ACUTE ONLY): 24 min  Charges:  $Gait Training: 8-22 mins $  Therapeutic Activity: 8-22 mins                    G Codes:      Rica Koyanagi  PTA [facility]  Acute  Rehab Pager      [phone]

## 2016-03-24 NOTE — Care Management Note (Signed)
Case Management Note  Patient Details  Name: Jonathan Tucker MRN: [id number] Date of Birth: [birth year]  Subjective/Objective:   D/c f/c-Void trial today. D/c am SNF if stable.               Action/Plan:d/c SNF.   Expected Discharge Date:   (unknown)               Expected Discharge Plan:  Skilled Nursing Facility  In-House Referral:  Clinical Social Work  Discharge planning Services  CM Consult  Post Acute Care Choice:    Choice offered to:     DME Arranged:    DME Agency:     HH Arranged:    [place] Agency:     Status of Service:  In process, will continue to follow  If discussed at Long Length of Stay Meetings, dates discussed:    Additional Comments:  Dessa Phi, RN 03/24/2016, 1:10 PM

## 2016-03-24 NOTE — Consult Note (Signed)
Urology Consult Note   Requesting Attending Physician:  Louellen Molder, MD Service Providing Consult: Urology Consulting Attending: Matilde Sprang, MD  Assessment:  Patient is a 65 y.o. 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 S/p OR debridement of necrotic tissue 9/29, 10/1, and 10/3 with aspiration of right hydrocele on 10/3.   Interval: AFVSS. 2.9L UOP via Foley. No new labs. On Bactrim  Recommendations: 1. Agree with BID dressing changes  2. Appreciate medicine's involvement in this patient's care, DM control/CAD, etc. 3. Discontinue Foley today and perform trial of void 4. Will schedule follow up for patient with urology as outpatient in 2 weeks  Thank you for this consult. Please contact the urology consult pager with any further questions/concerns. Sharmaine Base, MD Urology Surgical Resident  Subjective: Pain is getting slowly better with dressing changes. Eating/drinking well, no n/v. + Bms. Feels like he has less control during Bm. Yet to void s/p Foley removal  Objective   Vital signs in last 24 hours: BP 118/65 (BP Location: Right Arm)   Pulse 65   Temp 99 F (37.2 C) (Oral)   Resp 16   Ht 6' (1.829 m)   Wt 190 lb (86.2 kg)   SpO2 95%   BMI 25.77 kg/m   Intake/Output last 3 shifts: I/O last 3 completed shifts: In: 36 [P.O.:480] Out: 4800 [Urine:4800]  Physical Exam General: NAD, A&O HEENT: [place]/AT, EOMI, MMM Pulmonary: Normal work of breathing Cardiovascular: Regular rate & rhythm, HDS, adequate peripheral perfusion Abdomen: soft, NTTP, nondistended, no suprapubic fullness or tenderness GU: mesh underwear in place with no significant soak through, overlying gauze in wound bed appears dry with no soak through, scrotal & perineal dressing with minimal SS soakthrough; no significant foul smell or obvious areas of necrosis Extremities: warm and well perfused, no edema   Most Recent Labs: Lab Results   Component Value Date   WBC 6.9 03/20/2016   HGB 11.2 (L) 03/20/2016   HCT 35.1 (L) 03/20/2016   PLT 386 03/20/2016    Lab Results  Component Value Date   NA 137 03/19/2016   K 4.1 03/19/2016   CL 98 (L) 03/19/2016   CO2 35 (H) 03/19/2016   BUN 6 03/19/2016   CREATININE 0.64 03/19/2016   CALCIUM 8.3 (L) 03/19/2016   MG 1.6 (L) 03/15/2016   PHOS 2.9 03/15/2016    Lab Results  Component Value Date   ALKPHOS 39 03/15/2016   BILITOT 1.2 03/15/2016   PROT 4.3 (L) 03/15/2016   ALBUMIN 1.9 (L) 03/15/2016   ALT 10 (L) 03/15/2016   AST 22 03/15/2016    No results found for: INR, APTT   Urine Culture: No growth   IMAGING: No new

## 2016-03-24 NOTE — Progress Notes (Signed)
PROGRESS NOTE                                                                                                                                                                                                             Patient Demographics:    Jonathan Tucker, is a 65 y.o. male, DOB - [birth year], [id number]  Admit date - 03/14/2016   Admitting Physician Murlean Iba, MD  Outpatient Primary MD for the patient is [name],[name], PA-C  LOS - 10  Outpatient Specialists:none  Chief Complaint  Patient presents with  . Groin Swelling       Brief Narrative   65 year old male with type 2 diabetes mellitus, dyslipidemia, hypertension was referred for admission by urology with concern for Fournier's gangrene. Patient was septic in the ED with hypotension (BP 62/29 mmHg) cardiac to 110s and tachypnea. He was afebrile. Blood work showed leukocytosis with WBC of 17, sodium of 124 and lactic acid of 3. CT scan was concerning for Fournier's gangrene. Patient seen by surgery and underwent I&D of the perineal and scrotal abscess on 9/29 and 10/1 followed by debridement of the gangrene and aspiration of hydrocele fluid with partial closure of the gangrene on 10/3.   Subjective:   Pain continues to get better. Requiring IV pain meds only with dressing change. Patient able to use the commode today.   Assessment  & Plan :    Principal Problem: Severe sepsis secondary to Fournier's gangrene multiple debridements by surgery and urology.  Blood and scrotal abscess culture negative. Now on oral Bactrim ( total 2 weeks;stop date 10/13) Pain control primarily with hydrocodone, use Dilaudid as needed and before dressing changes.   DC Foley today with voiding trial. Urology will arrange outpatient follow-up in 2 weeks.  Active Problems: Acute hypoxic respiratory failure (HCC) Following I&D secondary to pulmonary vascular congestion. 2-D  echo on 10/3 showing EF of 55 and 60% with no wall motion abnormality. Currently stable.  New onset A. fib with RVR Suspected this is associated with sepsis. Artery stable on current dose of metoprolol. Continue aspirin (CHADS2vasc score of 1)  Hydrocele of scrotum Aspirated on 10/3.    Uncontrolled diabetes mellitus ([place]) Worsened with sepsis. A1c of 6.5. Lantus and pre-meal aspart adjusted.     Hyponatremia Secondary to dehydration and sepsis. Now resolved.  Acute kidney injury (  [place]) Secondary to sepsis. Resolved.    Acute blood loss anemia Currently stable.  Dyslipidemia Continue home medications  Essential hypertension On metoprolol.          Code Status : Full code  Family Communication  : Wife at bedside  Disposition Plan  : SNF possibly tomorrow.  Barriers For Discharge : voiding trial, monitor off IV pain meds  Consults  :   Surgery Neurology Cardiology  Procedures  :  I&D of the perineal and scrotal abscess on 9/29, 10/1 and 10/4  DVT Prophylaxis  : Lovenox  Lab Results  Component Value Date   PLT 386 03/20/2016    Antibiotics  :   Anti-infectives    Start     Dose/Rate Route Frequency Ordered Stop   03/20/16 1500  sulfamethoxazole-trimethoprim (BACTRIM DS,SEPTRA DS) 800-160 MG per tablet 1 tablet     1 tablet Oral Every 12 hours 03/20/16 1248     03/16/16 1600  vancomycin (VANCOCIN) IVPB 1000 mg/200 mL premix  Status:  Discontinued     1,000 mg 200 mL/hr over 60 Minutes Intravenous Every 8 hours 03/16/16 1131 03/20/16 1248   03/14/16 2200  vancomycin (VANCOCIN) IVPB 750 mg/150 ml premix  Status:  Discontinued     750 mg 150 mL/hr over 60 Minutes Intravenous Every 12 hours 03/14/16 1220 03/16/16 1131   03/14/16 2000  piperacillin-tazobactam (ZOSYN) IVPB 3.375 g  Status:  Discontinued     3.375 g 12.5 mL/hr over 240 Minutes Intravenous Every 8 hours 03/14/16 1218 03/20/16 1248   03/14/16 1520  polymyxin B 500,000 Units, bacitracin  50,000 Units in sodium chloride irrigation 0.9 % 500 mL irrigation  Status:  Discontinued       As needed 03/14/16 1521 03/14/16 1603   03/14/16 1045  vancomycin (VANCOCIN) IVPB 1000 mg/200 mL premix     1,000 mg 200 mL/hr over 60 Minutes Intravenous  Once 03/14/16 1033 03/14/16 1348   03/14/16 1030  clindamycin (CLEOCIN) IVPB 900 mg     900 mg 100 mL/hr over 30 Minutes Intravenous  Once 03/14/16 1019 03/14/16 1246   03/14/16 1030  piperacillin-tazobactam (ZOSYN) IVPB 3.375 g     3.375 g 100 mL/hr over 30 Minutes Intravenous  Once 03/14/16 1027 03/14/16 1304        Objective:   Vitals:   03/23/16 1300 03/23/16 2144 03/24/16 0555 03/24/16 0858  BP: 115/71 125/72 114/74 118/65  Pulse: 67 73 60 65  Resp: 18 18 16    Temp: 98.4 F (36.9 C) 98.5 F (36.9 C) 99 F (37.2 C)   TempSrc: Oral Oral Oral   SpO2: 93% 93% 95%   Weight:      Height:        Wt Readings from Last 3 Encounters:  03/14/16 86.2 kg (190 lb)     Intake/Output Summary (Last 24 hours) at 03/24/16 1149 Last data filed at 03/24/16 0607  Gross per 24 hour  Intake              240 ml  Output             2900 ml  Net            -2660 ml     Physical Exam  Gen: not in distress HEENT: moist mucosa, supple neck Chest: clear b/l, CVS: s1&s2 No murmurs GI: soft, NT, Dressing over scrotum Musculoskeletal: warm, no edema     Data Review:    CBC  Recent Labs Lab 03/18/16  1900 03/19/16 0320 03/20/16 0502  WBC 12.0* 10.0 6.9  HGB 12.4* 11.4* 11.2*  HCT 38.3* 35.0* 35.1*  PLT 377 407* 386  MCV 94.3 94.9 95.4  MCH 30.5 30.9 30.4  MCHC 32.4 32.6 31.9  RDW 14.4 14.5 14.3    Chemistries   Recent Labs Lab 03/18/16 0314 03/18/16 1900 03/19/16 0320  NA 137 139 137  K 3.9 4.2 4.1  CL 101 101 98*  CO2 31 32 35*  GLUCOSE 153* 145* 154*  BUN 6 6 6   CREATININE 0.74 0.62 0.64  CALCIUM 8.1* 8.4* 8.3*    ------------------------------------------------------------------------------------------------------------------ No results for input(s): CHOL, HDL, LDLCALC, TRIG, CHOLHDL, LDLDIRECT in the last 72 hours.  Lab Results  Component Value Date   HGBA1C 6.5 (H) 03/14/2016   ------------------------------------------------------------------------------------------------------------------ No results for input(s): TSH, T4TOTAL, T3FREE, THYROIDAB in the last 72 hours.  Invalid input(s): FREET3 ------------------------------------------------------------------------------------------------------------------ No results for input(s): VITAMINB12, FOLATE, FERRITIN, TIBC, IRON, RETICCTPCT in the last 72 hours.  Coagulation profile No results for input(s): INR, PROTIME in the last 168 hours.  No results for input(s): DDIMER in the last 72 hours.  Cardiac Enzymes No results for input(s): CKMB, TROPONINI, MYOGLOBIN in the last 168 hours.  Invalid input(s): CK ------------------------------------------------------------------------------------------------------------------ No results found for: BNP  Inpatient Medications  Scheduled Meds: . aspirin EC  325 mg Oral QPM  . colesevelam  1,250 mg Oral BID WC  . enoxaparin (LOVENOX) injection  40 mg Subcutaneous Q24H  . fenofibrate  160 mg Oral Daily  . insulin aspart  0-9 Units Subcutaneous TID WC  . insulin aspart  3 Units Subcutaneous TID WC  . insulin glargine  8 Units Subcutaneous Daily  . metoprolol tartrate  50 mg Oral BID  . rosuvastatin  5 mg Oral QPM  . sulfamethoxazole-trimethoprim  1 tablet Oral Q12H   Continuous Infusions:  PRN Meds:.acetaminophen **OR** acetaminophen, HYDROmorphone (DILAUDID) injection, HYDROmorphone (DILAUDID) injection, ipratropium-albuterol, ondansetron **OR** ondansetron (ZOFRAN) IV, oxyCODONE, senna-docusate, zolpidem  Micro Results Recent Results (from the past 240 hour(s))  Anaerobic culture     Status:  None   Collection Time: 03/14/16  3:01 PM  Result Value Ref Range Status   Specimen Description   Final    ABSCESS IRRIGATION AND DEBRIDEMENT SCROTAL ABCESS AND CYTOSCOPY   Special Requests NONE  Final   Culture   Final    MIXED ANAEROBIC FLORA PRESENT.  CALL LAB IF FURTHER IID REQUIRED.   Report Status 03/19/2016 FINAL  Final  Aerobic Culture (superficial specimen)     Status: None   Collection Time: 03/14/16  3:01 PM  Result Value Ref Range Status   Specimen Description   Final    ABSCESS IRRIGATION AND DEBRIDEMENT SCROTAL ABCESS AND CYTOSCOPY   Special Requests NONE  Final   Gram Stain   Final    FEW WBC PRESENT, PREDOMINANTLY MONONUCLEAR ABUNDANT GRAM NEGATIVE RODS FEW GRAM POSITIVE COCCI IN PAIRS FEW GRAM POSITIVE RODS    Culture   Final    NORMAL SKIN FLORA Performed at [facility]    Report Status 03/17/2016 FINAL  Final  Fungus Stain     Status: None   Collection Time: 03/14/16  3:01 PM  Result Value Ref Range Status   FUNGUS STAIN Final report  Final    Comment: (NOTE) Performed At: [facility] [address] [place], [place] [id number] Lindon Romp MD [id number]    Fungal Source ABSCESS  Final    Comment: IRRIGATION AND DEBRIDEMENT SCROTAL ABCESS AND  CYTOSCOPY  Fungal Stain reflex     Status: None   Collection Time: 03/14/16  3:01 PM  Result Value Ref Range Status   Fungal stain result 1 Comment  Final    Comment: (NOTE) KOH/Calcofluor preparation:  no fungus observed. Performed At: [facility] [place], [place] [id number] Lindon Romp MD [id number]   MRSA PCR Screening     Status: None   Collection Time: 03/14/16  5:32 PM  Result Value Ref Range Status   MRSA by PCR NEGATIVE NEGATIVE Final    Comment:        The GeneXpert MRSA Assay (FDA approved for NASAL specimens only), is one component of a comprehensive MRSA colonization surveillance program. It is not intended to diagnose MRSA infection  nor to guide or monitor treatment for MRSA infections.   Urine culture     Status: None   Collection Time: 03/14/16  8:02 PM  Result Value Ref Range Status   Specimen Description URINE, CATHETERIZED  Final   Special Requests NONE  Final   Culture NO GROWTH Performed at [facility]   Final   Report Status 03/16/2016 FINAL  Final    Radiology Reports Ct Abdomen Pelvis W Contrast  Result Date: 03/14/2016 CLINICAL DATA:  65 year old male with diabetes mellitus and scrotal infection with fever and erythema spreading to the right buttock. Clinical concern for Fournier's gangrene. EXAM: CT ABDOMEN AND PELVIS WITH CONTRAST TECHNIQUE: Multidetector CT imaging of the abdomen and pelvis was performed using the standard protocol following bolus administration of intravenous contrast. CONTRAST:  116mL ISOVUE-300 IOPAMIDOL (ISOVUE-300) INJECTION 61% COMPARISON:  None. FINDINGS: Lower chest: No significant pulmonary nodules or acute consolidative airspace disease. Centrilobular emphysema at the lung bases. Coronary atherosclerosis. Hepatobiliary: Diffuse hepatic steatosis. No liver mass. Normal gallbladder with no radiopaque cholelithiasis. No biliary ductal dilatation. Pancreas: Normal, with no mass or duct dilation. Spleen: Normal size. No mass. Adrenals/Urinary Tract: Normal adrenals. No hydronephrosis. Hypodense 0.6 cm renal cortical lesion in the lateral interpolar left kidney, too small to characterize, for which no further follow-up is required. No additional renal lesions. Normal bladder. Stomach/Bowel: Grossly normal stomach. Normal caliber small bowel with no small bowel wall thickening. Normal appendix. Normal large bowel with no diverticulosis, large bowel wall thickening or pericolonic fat stranding. Vascular/Lymphatic: Atherosclerotic nonaneurysmal abdominal aorta. Patent portal, splenic, hepatic and renal veins. Bilateral inguinal adenopathy measuring up to 2.1 cm on the right (series 2/  image 78) and 2.0 cm on the left (series 2/ image 77). No additional pathologically enlarged abdominopelvic nodes. Reproductive: Normal size prostate with nonspecific internal prostatic calcifications. Symmetric normal size seminal vesicles. Large right hydrocele measuring 7.5 x 6.4 cm (series 3/image 18). No left hydrocele. Severe thickening of the skin and subcutaneous soft tissues of the entire scrotum. Skin thickening, subcutaneous fat stranding and extensive subcutaneous emphysema throughout the perineum and bilateral medial buttocks, extending into the right ischial rectal fossa and deep right upper scrotal soft tissues. Subcutaneous abscess in the right perineum measuring 4.1 x 2.7 x 3.1 cm (series 2/image 99), demonstrating air-fluid level and thick enhancing wall. No additional focal drainable fluid collections in the abdomen or pelvis. Other: No pneumoperitoneum, ascites or focal fluid collection. Musculoskeletal: No aggressive appearing focal osseous lesions. Mild-to-moderate thoracolumbar spondylosis. IMPRESSION: 1. CT findings consistent with Fournier's gangrene, with extensive skin thickening, subcutaneous fat stranding and subcutaneous emphysema throughout the perineum, bilateral medial buttocks, right ischiorectal fossa and scrotum. 2. Subcutaneous abscess in the right perineum. Large  right hydrocele. No ascites or intraperitoneal gas or fluid collections. 3. Additional findings include aortic atherosclerosis, coronary atherosclerosis, centrilobular emphysema and diffuse hepatic steatosis. These results were called by telephone at the time of interpretation on 03/14/2016 at 12:29 pm to Dr. Addison Lank , who verbally acknowledged these results. Electronically Signed   By: [name] M.D.   On: 03/14/2016 12:32   Dg Chest Port 1 View  Result Date: 03/16/2016 CLINICAL DATA:  Post I&D today now with confusion, hypoxia and hypotension. EXAM: PORTABLE CHEST 1 VIEW COMPARISON:  03/12/2016; 06/27/2010  FINDINGS: Examination is degraded secondary to exclusion of the left costophrenic angle. Grossly unchanged enlarged cardiac silhouette and mediastinal contours given reduced lung volumes. Atherosclerotic plaque within the thoracic aorta. Pulmonary venous congestion without frank evidence of edema. Worsening bilateral infrahilar opacities favored to represent atelectasis. No discrete focal airspace opacities. No definite pleural effusion, though note, the left costophrenic angle is excluded from view. No pneumothorax. No acute osseus abnormalities. IMPRESSION: Cardiomegaly and pulmonary venous congestion without definitive superimposed acute cardiopulmonary disease on these degraded AP portable examinations. Further evaluation with a PA and lateral chest radiographs may be obtained as clinically indicated. Electronically Signed   By: Sandi Mariscal M.D.   On: 03/16/2016 14:57   Dg Chest Port 1 View  Result Date: 03/14/2016 CLINICAL DATA:  Sepsis.  Smoker. EXAM: PORTABLE CHEST 1 VIEW COMPARISON:  06/27/2010 . FINDINGS: Mediastinum and hilar structures normal. Mild cardiomegaly. Mild right base infiltrate cannot be excluded. No pleural effusion or pneumothorax . No acute bony abnormality. IMPRESSION: 1. Mild right base infiltrate cannot be excluded. 2. Mild cardiomegaly. Electronically Signed   By: Marcello Moores  Register   On: 03/14/2016 12:40    Time Spent in minutes  25   Louellen Molder M.D on 03/24/2016 at 11:49 AM  Between 7am to 7pm - Pager - [phone]  After 7pm go to [email or web address] - password [email or web address]  [facility] -  Office  [phone]

## 2016-03-25 ENCOUNTER — Encounter (HOSPITAL_COMMUNITY): Payer: Self-pay | Admitting: Surgery

## 2016-03-25 DIAGNOSIS — R6520 Severe sepsis without septic shock: Secondary | ICD-10-CM | POA: Diagnosis not present

## 2016-03-25 DIAGNOSIS — Z87891 Personal history of nicotine dependence: Secondary | ICD-10-CM | POA: Diagnosis not present

## 2016-03-25 DIAGNOSIS — I4891 Unspecified atrial fibrillation: Secondary | ICD-10-CM | POA: Diagnosis not present

## 2016-03-25 DIAGNOSIS — N179 Acute kidney failure, unspecified: Secondary | ICD-10-CM

## 2016-03-25 DIAGNOSIS — E0865 Diabetes mellitus due to underlying condition with hyperglycemia: Secondary | ICD-10-CM

## 2016-03-25 DIAGNOSIS — A419 Sepsis, unspecified organism: Secondary | ICD-10-CM | POA: Diagnosis not present

## 2016-03-25 DIAGNOSIS — F17200 Nicotine dependence, unspecified, uncomplicated: Secondary | ICD-10-CM

## 2016-03-25 DIAGNOSIS — E089 Diabetes mellitus due to underlying condition without complications: Secondary | ICD-10-CM

## 2016-03-25 DIAGNOSIS — N501 Vascular disorders of male genital organs: Secondary | ICD-10-CM | POA: Diagnosis not present

## 2016-03-25 DIAGNOSIS — E785 Hyperlipidemia, unspecified: Secondary | ICD-10-CM | POA: Diagnosis not present

## 2016-03-25 DIAGNOSIS — J449 Chronic obstructive pulmonary disease, unspecified: Secondary | ICD-10-CM | POA: Diagnosis not present

## 2016-03-25 DIAGNOSIS — Z794 Long term (current) use of insulin: Secondary | ICD-10-CM | POA: Diagnosis not present

## 2016-03-25 DIAGNOSIS — I96 Gangrene, not elsewhere classified: Secondary | ICD-10-CM | POA: Diagnosis not present

## 2016-03-25 DIAGNOSIS — I10 Essential (primary) hypertension: Secondary | ICD-10-CM

## 2016-03-25 DIAGNOSIS — Z7982 Long term (current) use of aspirin: Secondary | ICD-10-CM | POA: Diagnosis not present

## 2016-03-25 DIAGNOSIS — F17210 Nicotine dependence, cigarettes, uncomplicated: Secondary | ICD-10-CM | POA: Diagnosis not present

## 2016-03-25 DIAGNOSIS — N493 Fournier gangrene: Secondary | ICD-10-CM | POA: Diagnosis not present

## 2016-03-25 DIAGNOSIS — E871 Hypo-osmolality and hyponatremia: Secondary | ICD-10-CM

## 2016-03-25 DIAGNOSIS — Z48817 Encounter for surgical aftercare following surgery on the skin and subcutaneous tissue: Secondary | ICD-10-CM | POA: Diagnosis not present

## 2016-03-25 DIAGNOSIS — M6281 Muscle weakness (generalized): Secondary | ICD-10-CM | POA: Diagnosis not present

## 2016-03-25 LAB — BASIC METABOLIC PANEL: GLUCOSE: 142 mg/dL

## 2016-03-25 LAB — GLUCOSE, CAPILLARY
GLUCOSE-CAPILLARY: 106 mg/dL — AB (ref 65–99)
Glucose-Capillary: 142 mg/dL — ABNORMAL HIGH (ref 65–99)

## 2016-03-25 MED ORDER — SULFAMETHOXAZOLE-TRIMETHOPRIM 800-160 MG PO TABS: 1.0000 | ORAL_TABLET | Freq: Two times a day (BID) | ORAL | 0 refills | Status: DC

## 2016-03-25 MED ORDER — INSULIN ASPART 100 UNIT/ML ~~LOC~~ SOLN: 0.0000 [IU] | Freq: Three times a day (TID) | SUBCUTANEOUS | 11 refills | Status: DC

## 2016-03-25 MED ORDER — OXYCODONE HCL 5 MG PO TABS: 5.0000 mg | ORAL_TABLET | ORAL | 0 refills | Status: DC | PRN

## 2016-03-25 MED ORDER — METOPROLOL TARTRATE 50 MG PO TABS: 50.0000 mg | ORAL_TABLET | Freq: Two times a day (BID) | ORAL | 0 refills | Status: DC

## 2016-03-25 MED ORDER — SENNOSIDES-DOCUSATE SODIUM 8.6-50 MG PO TABS: 1.0000 | ORAL_TABLET | Freq: Every evening | ORAL | 0 refills | Status: DC | PRN

## 2016-03-25 MED ORDER — INSULIN GLARGINE 100 UNIT/ML ~~LOC~~ SOLN: 16.0000 [IU] | Freq: Every day | SUBCUTANEOUS | 11 refills | Status: DC

## 2016-03-25 NOTE — Care Management Note (Signed)
Case Management Note  Patient Details  Name: Jonathan Tucker MRN: [id number] Date of Birth: 07-Feb-1951  Subjective/Objective:                    Action/Plan:d/c SNF   Expected Discharge Date:   (unknown)               Expected Discharge Plan:  Skilled Nursing Facility  In-House Referral:  Clinical Social Work  Discharge planning Services  CM Consult  Post Acute Care Choice:    Choice offered to:     DME Arranged:    DME Agency:     HH Arranged:    [place] Agency:     Status of Service:  Completed, signed off  If discussed at [employer], dates discussed:    Additional Comments:  Dessa Phi, RN 03/25/2016, 11:04 AM

## 2016-03-25 NOTE — Progress Notes (Signed)
S: No acute events. Has not tried dressing change without IV meds yet.   Vitals, labs, intake/output, and orders reviewed at this time.  Gen: A&Ox3, no distress  H&N: EOMI, atraumatic, neck supple Chest: unlabored respirations, RRR Abd: soft, nontender, nondistended Perineal drsg c/d/i Ext: warm, no edema Neuro: grossly normal  Lines/tubes/drains: PIV  A/P:  Fournier's gangrene s/p debridement. Dressing change with PO meds only today. OK for discharge from surgery standpoint.    Romana Juniper, MD [facility], [place] Pager [phone]

## 2016-03-25 NOTE — Discharge Summary (Signed)
Physician Discharge Summary  Jonathan Tucker [id number] DOB: 1950/10/05 DOA: 03/14/2016  PCP: [name],[name], PA-C  Admit date: 03/14/2016 Discharge date: 03/25/2016  Admitted From: home Disposition:  [facility] ([facility])  Recommendations for Outpatient Follow-up:  1. Follow up with MD at SNF. 2. Patient will be followed up by [place] [facility] in 2 weeks. Recommend daily dressing change. Please give 2 tablets of oxycodone 30 minutes prior to dressing change. Please titrate pain medications as per need. 3. Patient will complete 2 weeks course of antibiotic on 10/13.  Home Health: None Equipment/Devices: None  Discharge Condition: Stable CODE STATUS: Full code Diet recommendation: Diabetic    Discharge Diagnoses:  Principal Problem:   Severe sepsis ([place])   Active Problems:   Fournier's gangrene in male   Uncontrolled diabetes mellitus (HCC)   Hyponatremia   Leukocytosis   Acute renal insufficiency   Current every day smoker   Rigors   COPD (chronic obstructive pulmonary disease) (HCC)   Atrial fibrillation with RVR (HCC)   Essential hypertension   Dyslipidemia   Brief narrative/history of present illness Please refer to admission H&P for details, in brief,65 year old male with type 2 diabetes mellitus, dyslipidemia, hypertension was referred for admission by urology with concern for Fournier's gangrene. Patient was septic in the ED with hypotension (BP 62/29 mmHg) cardiac to 110s and tachypnea. He was afebrile. Blood work showed leukocytosis with WBC of 17, sodium of 124 and lactic acid of 3. CT scan was concerning for Fournier's gangrene. Patient seen by surgery and underwent I&D of the perineal and scrotal abscess on 9/29 and 10/1 followed by debridement of the gangrene and aspiration of hydrocele fluid with partial closure of the gangrene on 10/3.  Hospital course   Principal Problem: Severe sepsis secondary to Fournier's gangrene multiple debridements by  surgery and urology.  Blood and scrotal abscess culture negative. Now on oral Bactrim ( total 2 weeks;stop date 10/13) Pain control primarily with oxycodone, required Dilaudid as needed  before dressing changes while in the hospital..   Foley discontinued and urinating without difficulty.   Active Problems: Acute hypoxic respiratory failure (HCC) Following I&D secondary to pulmonary vascular congestion. 2-D echo on 10/3 showing EF of 55 and 60% with no wall motion abnormality. Now stable.  New onset A. fib with RVR Suspected this is associated with sepsis. Heart rate on current dose of metoprolol. Continue aspirin (CHADS2vasc score of 1)  Hydrocele of scrotum Aspirated on 10/3.    Uncontrolled diabetes mellitus ([place]) Worsened with sepsis. A1c of 6.5. Lantus and pre-meal aspart dose adjusted.     Hyponatremia Secondary to dehydration and sepsis. Now resolved.  Acute kidney injury ([place]) Secondary to sepsis. Resolved.    Acute blood loss anemia Currently stable.  Dyslipidemia Continue home medications  Essential hypertension Stable On metoprolol. Resumed ramipril and HCTZ upon discharge.       Family Communication  : Wife at bedside  Disposition Plan  : SNF   Consults  :   Surgery Neurology Cardiology  Procedures  :  I&D of the perineal and scrotal abscess on 9/29, 10/1 and 10/4   Discharge Instructions     Medication List    TAKE these medications   aspirin EC 325 MG tablet Take 325 mg by mouth every evening.   calcium carbonate 500 MG chewable tablet Commonly known as:  TUMS - dosed in mg elemental calcium Chew 1 tablet by mouth daily as needed for indigestion or heartburn.   colesevelam 625 MG tablet  Commonly known as:  WELCHOL Take 1,250 mg by mouth 2 (two) times daily with a meal.   fenofibrate 145 MG tablet Commonly known as:  TRICOR Take 145 mg by mouth every evening.   hydrochlorothiazide 25 MG tablet Commonly known  as:  HYDRODIURIL Take 25 mg by mouth daily.   insulin aspart 100 UNIT/ML injection Commonly known as:  novoLOG Inject 0-9 Units into the skin 3 (three) times daily with meals.   insulin glargine 100 UNIT/ML injection Commonly known as:  LANTUS Inject 0.16 mLs (16 Units total) into the skin daily.   metoprolol 50 MG tablet Commonly known as:  LOPRESSOR Take 1 tablet (50 mg total) by mouth 2 (two) times daily.   naproxen sodium 220 MG tablet Commonly known as:  ANAPROX Take 440 mg by mouth daily as needed (pain).   oxyCODONE 5 MG immediate release tablet Commonly known as:  Oxy IR/ROXICODONE Take 1 tablet (5 mg total) by mouth every 4 (four) hours as needed for moderate pain, severe pain or breakthrough pain (give 30 mins prior to dressing changes). Please give 2 tablets( 10 mg) 30 minutes before dressing change.   ramipril 10 MG capsule Commonly known as:  ALTACE Take 10 mg by mouth every evening.   rosuvastatin 5 MG tablet Commonly known as:  CRESTOR Take 5 mg by mouth every evening.   senna-docusate 8.6-50 MG tablet Commonly known as:  Senokot-S Take 1 tablet by mouth at bedtime as needed for mild constipation.   sulfamethoxazole-trimethoprim 800-160 MG tablet Commonly known as:  BACTRIM DS,SEPTRA DS Take 1 tablet by mouth every 12 (twelve) hours.      Follow-up Information    MD at SNF in 1 week .        Rosario Adie., MD. Call in 2 week(s).   Specialty:  General Surgery Why:  office will call with appt Contact information: [address] [place] [address] [phone]        MACDIARMID,SCOTT A, MD Follow up in 2 week(s).   Specialty:  Urology Why:  office will call with appt Contact information: [address] [place] [address] [phone]          Allergies  Allergen Reactions  . Codeine     Does not tolerate, tolerates hydrocodone   . Flexeril [Cyclobenzaprine]     Burning/itching  . Tape Other (See Comments)    Per  patient "it burns and blisters"        Procedures/Studies: Ct Abdomen Pelvis W Contrast  Result Date: 03/14/2016 CLINICAL DATA:  65 year old male with diabetes mellitus and scrotal infection with fever and erythema spreading to the right buttock. Clinical concern for Fournier's gangrene. EXAM: CT ABDOMEN AND PELVIS WITH CONTRAST TECHNIQUE: Multidetector CT imaging of the abdomen and pelvis was performed using the standard protocol following bolus administration of intravenous contrast. CONTRAST:  121mL ISOVUE-300 IOPAMIDOL (ISOVUE-300) INJECTION 61% COMPARISON:  None. FINDINGS: Lower chest: No significant pulmonary nodules or acute consolidative airspace disease. Centrilobular emphysema at the lung bases. Coronary atherosclerosis. Hepatobiliary: Diffuse hepatic steatosis. No liver mass. Normal gallbladder with no radiopaque cholelithiasis. No biliary ductal dilatation. Pancreas: Normal, with no mass or duct dilation. Spleen: Normal size. No mass. Adrenals/Urinary Tract: Normal adrenals. No hydronephrosis. Hypodense 0.6 cm renal cortical lesion in the lateral interpolar left kidney, too small to characterize, for which no further follow-up is required. No additional renal lesions. Normal bladder. Stomach/Bowel: Grossly normal stomach. Normal caliber small bowel with no small  bowel wall thickening. Normal appendix. Normal large bowel with no diverticulosis, large bowel wall thickening or pericolonic fat stranding. Vascular/Lymphatic: Atherosclerotic nonaneurysmal abdominal aorta. Patent portal, splenic, hepatic and renal veins. Bilateral inguinal adenopathy measuring up to 2.1 cm on the right (series 2/ image 78) and 2.0 cm on the left (series 2/ image 77). No additional pathologically enlarged abdominopelvic nodes. Reproductive: Normal size prostate with nonspecific internal prostatic calcifications. Symmetric normal size seminal vesicles. Large right hydrocele measuring 7.5 x 6.4 cm (series 3/image 18). No  left hydrocele. Severe thickening of the skin and subcutaneous soft tissues of the entire scrotum. Skin thickening, subcutaneous fat stranding and extensive subcutaneous emphysema throughout the perineum and bilateral medial buttocks, extending into the right ischial rectal fossa and deep right upper scrotal soft tissues. Subcutaneous abscess in the right perineum measuring 4.1 x 2.7 x 3.1 cm (series 2/image 99), demonstrating air-fluid level and thick enhancing wall. No additional focal drainable fluid collections in the abdomen or pelvis. Other: No pneumoperitoneum, ascites or focal fluid collection. Musculoskeletal: No aggressive appearing focal osseous lesions. Mild-to-moderate thoracolumbar spondylosis. IMPRESSION: 1. CT findings consistent with Fournier's gangrene, with extensive skin thickening, subcutaneous fat stranding and subcutaneous emphysema throughout the perineum, bilateral medial buttocks, right ischiorectal fossa and scrotum. 2. Subcutaneous abscess in the right perineum. Large right hydrocele. No ascites or intraperitoneal gas or fluid collections. 3. Additional findings include aortic atherosclerosis, coronary atherosclerosis, centrilobular emphysema and diffuse hepatic steatosis. These results were called by telephone at the time of interpretation on 03/14/2016 at 12:29 pm to Dr. Addison Lank , who verbally acknowledged these results. Electronically Signed   By: [name] M.D.   On: 03/14/2016 12:32   Dg Chest Port 1 View  Result Date: 03/16/2016 CLINICAL DATA:  Post I&D today now with confusion, hypoxia and hypotension. EXAM: PORTABLE CHEST 1 VIEW COMPARISON:  03/12/2016; 06/27/2010 FINDINGS: Examination is degraded secondary to exclusion of the left costophrenic angle. Grossly unchanged enlarged cardiac silhouette and mediastinal contours given reduced lung volumes. Atherosclerotic plaque within the thoracic aorta. Pulmonary venous congestion without frank evidence of edema. Worsening  bilateral infrahilar opacities favored to represent atelectasis. No discrete focal airspace opacities. No definite pleural effusion, though note, the left costophrenic angle is excluded from view. No pneumothorax. No acute osseus abnormalities. IMPRESSION: Cardiomegaly and pulmonary venous congestion without definitive superimposed acute cardiopulmonary disease on these degraded AP portable examinations. Further evaluation with a PA and lateral chest radiographs may be obtained as clinically indicated. Electronically Signed   By: Sandi Mariscal M.D.   On: 03/16/2016 14:57   Dg Chest Port 1 View  Result Date: 03/14/2016 CLINICAL DATA:  Sepsis.  Smoker. EXAM: PORTABLE CHEST 1 VIEW COMPARISON:  06/27/2010 . FINDINGS: Mediastinum and hilar structures normal. Mild cardiomegaly. Mild right base infiltrate cannot be excluded. No pleural effusion or pneumothorax . No acute bony abnormality. IMPRESSION: 1. Mild right base infiltrate cannot be excluded. 2. Mild cardiomegaly. Electronically Signed   By: Marcello Moores  Register   On: 03/14/2016 12:40    2-D echo (03/18/2016) Study Conclusions  - Left ventricle: The cavity size was normal. Systolic function was   normal. The estimated ejection fraction was in the range of 55%   to 60%. Wall motion was normal; there were no regional wall   motion abnormalities. - Left atrium: The atrium was mildly dilated.  Subjective: Scrotal pain continues to improve. Requiring pain medication for dressing change.  Discharge Exam: Vitals:   03/24/16 2142 03/25/16 0602  BP: 116/70 119/67  Pulse: 71 (!) 52  Resp: 18 18  Temp: 98.2 F (36.8 C) 98.2 F (36.8 C)   Vitals:   03/24/16 0858 03/24/16 1454 03/24/16 2142 03/25/16 0602  BP: 118/65 109/61 116/70 119/67  Pulse: 65 63 71 (!) 52  Resp:  20 18 18   Temp:  98.2 F (36.8 C) 98.2 F (36.8 C) 98.2 F (36.8 C)  TempSrc:  Oral Oral Oral  SpO2:  95% 94% 95%  Weight:      Height:       Gen: not in distress HEENT: moist  mucosa, supple neck Chest: clear b/l, CVS: s1&s2 No murmurs GI: soft, NT, Dressing over scrotum Musculoskeletal: warm, no edema    The results of significant diagnostics from this hospitalization (including imaging, microbiology, ancillary and laboratory) are listed below for reference.     Microbiology: No results found for this or any previous visit (from the past 240 hour(s)).   Labs: BNP (last 3 results) No results for input(s): BNP in the last 8760 hours. Basic Metabolic Panel:  Recent Labs Lab 03/18/16 1900 03/19/16 0320  NA 139 137  K 4.2 4.1  CL 101 98*  CO2 32 35*  GLUCOSE 145* 154*  BUN 6 6  CREATININE 0.62 0.64  CALCIUM 8.4* 8.3*   Liver Function Tests: No results for input(s): AST, ALT, ALKPHOS, BILITOT, PROT, ALBUMIN in the last 168 hours. No results for input(s): LIPASE, AMYLASE in the last 168 hours. No results for input(s): AMMONIA in the last 168 hours. CBC:  Recent Labs Lab 03/18/16 1900 03/19/16 0320 03/20/16 0502  WBC 12.0* 10.0 6.9  HGB 12.4* 11.4* 11.2*  HCT 38.3* 35.0* 35.1*  MCV 94.3 94.9 95.4  PLT 377 407* 386   Cardiac Enzymes: No results for input(s): CKTOTAL, CKMB, CKMBINDEX, TROPONINI in the last 168 hours. BNP: Invalid input(s): POCBNP CBG:  Recent Labs Lab 03/24/16 0804 03/24/16 1247 03/24/16 1730 03/24/16 2057 03/25/16 0729  GLUCAP 124* 183* 105* 168* 142*   D-Dimer No results for input(s): DDIMER in the last 72 hours. Hgb A1c No results for input(s): HGBA1C in the last 72 hours. Lipid Profile No results for input(s): CHOL, HDL, LDLCALC, TRIG, CHOLHDL, LDLDIRECT in the last 72 hours. Thyroid function studies No results for input(s): TSH, T4TOTAL, T3FREE, THYROIDAB in the last 72 hours.  Invalid input(s): FREET3 Anemia work up No results for input(s): VITAMINB12, FOLATE, FERRITIN, TIBC, IRON, RETICCTPCT in the last 72 hours. Urinalysis    Component Value Date/Time   COLORURINE YELLOW 03/14/2016 2002    APPEARANCEUR CLEAR 03/14/2016 2002   LABSPEC 1.028 03/14/2016 2002   PHURINE 6.0 03/14/2016 2002   GLUCOSEU NEGATIVE 03/14/2016 2002   HGBUR TRACE (A) 03/14/2016 2002   BILIRUBINUR NEGATIVE 03/14/2016 2002   KETONESUR NEGATIVE 03/14/2016 2002   PROTEINUR 30 (A) 03/14/2016 2002   NITRITE NEGATIVE 03/14/2016 2002   LEUKOCYTESUR NEGATIVE 03/14/2016 2002   Sepsis Labs Invalid input(s): PROCALCITONIN,  WBC,  LACTICIDVEN Microbiology No results found for this or any previous visit (from the past 240 hour(s)).   Time coordinating discharge: Over 30 minutes  SIGNED:   Louellen Molder, MD  [facility] 03/25/2016, 11:48 AM Pager   If 7PM-7AM, please contact night-coverage [email or web address] Password [email or web address]

## 2016-03-25 NOTE — Clinical Social Work Placement (Signed)
Patient is set to discharge to [facility] today. Patient & wife at bedside made aware. Discharge packet given to RN, Sophia. [facility] called for transport to pickup at 1:30pm.     [name], [place] Worker cell #: [phone]    CLINICAL SOCIAL WORK PLACEMENT  NOTE  Date:  03/25/2016  Patient Details  Name: Jonathan Tucker MRN: [id number] Date of Birth: 05-11-1951  Clinical Social Work is seeking post-discharge placement for this patient at the [place] level of care (*CSW will initial, date and re-position this form in  chart as items are completed):  Yes   Patient/family provided with [place] Work Department's list of facilities offering this level of care within the geographic area requested by the patient (or if unable, by the patient's family).  Yes   Patient/family informed of their freedom to choose among providers that offer the needed level of care, that participate in Medicare, Medicaid or managed care program needed by the patient, have an available bed and are willing to accept the patient.  Yes   Patient/family informed of [facility]'s ownership interest in [facility] and [facility], as well as of the fact that they are under no obligation to receive care at these facilities.  PASRR submitted to EDS on 03/21/16     PASRR number received on 03/21/16     Existing PASRR number confirmed on       FL2 transmitted to all facilities in geographic area requested by pt/family on 03/21/16     FL2 transmitted to all facilities within larger geographic area on       Patient informed that his/her managed care company has contracts with or will negotiate with certain facilities, including the following:        Yes   Patient/family informed of bed offers received.  Patient chooses bed at [place] recommends and patient chooses bed at      Patient to be  transferred to [facility] on 03/25/16.  Patient to be transferred to facility by [facility]     Patient family notified on 03/25/16 of transfer.  Name of family member notified:  patient's wife at bedside     PHYSICIAN       Additional Comment:    _______________________________________________ Standley Brooking, LCSW 03/25/2016, 12:19 PM

## 2016-03-25 NOTE — Progress Notes (Signed)
Report given to [place] from [place]. Pt stable dressing change completed. Wife at bedside, SRP, RN

## 2016-03-27 ENCOUNTER — Non-Acute Institutional Stay (SKILLED_NURSING_FACILITY): Payer: Medicare Other | Admitting: Internal Medicine

## 2016-03-27 ENCOUNTER — Encounter: Payer: Self-pay | Admitting: Internal Medicine

## 2016-03-27 DIAGNOSIS — N501 Vascular disorders of male genital organs: Secondary | ICD-10-CM

## 2016-03-27 DIAGNOSIS — N493 Fournier gangrene: Secondary | ICD-10-CM

## 2016-03-27 DIAGNOSIS — E0865 Diabetes mellitus due to underlying condition with hyperglycemia: Secondary | ICD-10-CM

## 2016-03-27 DIAGNOSIS — Z87891 Personal history of nicotine dependence: Secondary | ICD-10-CM

## 2016-03-27 DIAGNOSIS — R6520 Severe sepsis without septic shock: Secondary | ICD-10-CM | POA: Diagnosis not present

## 2016-03-27 DIAGNOSIS — A419 Sepsis, unspecified organism: Secondary | ICD-10-CM

## 2016-03-27 DIAGNOSIS — I4891 Unspecified atrial fibrillation: Secondary | ICD-10-CM

## 2016-03-27 NOTE — Progress Notes (Signed)
Dr. Unice Cobble [address] [place] [address] [facility] Room: [address]  [id number] [name], [place]  Chief Complaint  Patient presents with  . New Admit To SNF    New Admit to [facility]   Allergies  Allergen Reactions  . Codeine     Does not tolerate, tolerates hydrocodone   . Flexeril [Cyclobenzaprine]     Burning/itching  . Tape Other (See Comments)    Per patient "it burns and blisters"    This is a comprehensive admission note to [facility] performed on this date less than 30 days from date of admission. Included are preadmission medical/surgical history;reconciled medication list; family history; social history and comprehensive review of systems.  Corrections and additions to the records were documented . Comprehensive physical exam was also performed. Additionally a clinical summary was entered for each active diagnosis pertinent to this admission in the Problem List to enhance continuity of care.  HPI:The patient was hospitalized 9/29-10/10/17 with severe sepsis. He had Fournier's gangrene in the context of a diagnosis of uncontrolled diabetes. Complications include acute renal insufficiency and acute hypoxic respiratory failure. Respiratory failure was attributed to pulmonary vascular congestion. He was an active smoker up until the time of admission. He developed new onset atrial fibrillation with rapid ventricular response attributed to the sepsis. Metoprolol was used to control the heart rate; he was to continue aspirin for the atrial fibrillation with a CHADS2 vasc score of 1. Diabetes was diagnosed as uncontrolled yet his A1c was 6.5%. His wife states he was never on insulin before.He was diagnosed with diabetes approximate 6 years ago in the context of weight over 300 pounds. Her 7 improved with nutrition and weight loss until the acute septic episode. Renal function normal at discharge with a creatinine of 0.64 and GFR over 60. He  also had acute blood loss anemia which is felt to be stable. In the Emergency room blood pressure was 62/29 associated with  with tachycardia and tachypnea. White count was 17,000. Sodium was 124 and lactic acid 3. CT scan suggested Fournier's  gangrene. He under went incision and drainage of the perineal and scrotal abscess on 9/29 and 10/1 followed by debridement of the gangrene and aspiration of the hydrocele fluid with partial closure of the gangrene on 10/3. Blood and scrotal abscess cultures were negative. He was to continue oral Bactrim through 03/27/16.  He has received oxycodone prior to dressing changes; but he wishes to avoid addictive medications. He and his wife feel that she is capable of changing the dressings and they are requesting discharge home TODAY.  Past medical and surgical history: Complicated medical history includes dyslipidemia and HTN.   Social history: No alcohol; he smoked 2 packs per day prior to his admission  Family history: 8 paternal aunts and uncles had cancers, hormonally related. These included prostate, breast, and ovarian. His mother had an aortic aneurysm.  Review of systems: At this time he states that his cough is improved. His only complaint is some dysuria since Foley catheter was discontinued. He also has some pain in the perineum  when he sits upright for a period of time. Constitutional: No fever,significant weight change, fatigue  Eyes: No redness, discharge, pain, vision change ENT/mouth: No nasal congestion,  purulent discharge, earache,change in hearing ,sore throat  Cardiovascular: No chest pain, palpitations,paroxysmal nocturnal dyspnea, claudication, edema  Respiratory: No sputum production,hemoptysis, DOE , significant snoring,apnea (as per wife)   Gastrointestinal: No heartburn,dysphagia,abdominal pain, nausea /  vomiting,rectal bleeding, melena,change in bowels Genitourinary: No hematuria, pyuria,  incontinence, nocturia Musculoskeletal: No  joint stiffness, joint swelling, weakness,pain Dermatologic: No rash, pruritus, change in appearance of skin Neurologic: No dizziness,headache,syncope, seizures, numbness , tingling Psychiatric: Some anxiety as per wife with acute illness. Endocrine: No change in hair/skin/ nails, excessive thirst, excessive hunger, excessive urination  Hematologic/lymphatic: No significant bruising, lymphadenopathy,abnormal bleeding Allergy/immunology: No itchy/ watery eyes, significant sneezing, urticaria, angioedema  Physical exam:  Pertinent or positive findings: He is unshaven and has a mustache. The maxilla is edentulous. There is a small mucocele on the palate. Heart sounds are markedly decreased and distant. Rhythm cannot clinically be determined with certainty. Barrel chest is present. Breath sounds are decreased. He has slight hyperpigmentation of ankles, greater on the right than the left. The operative wounds were visualized during a dressing change. There is no evidence of cellulitis or purulence. He is alert and oriented and very communicative. His desire to go home is realistic based on the interview & exam. General appearance:Adequately nourished; no acute distress , increased work of breathing is present.   Lymphatic: No lymphadenopathy about the head, neck, axilla . Eyes: No conjunctival inflammation or lid edema is present. There is no scleral icterus. Ears:  External ear exam shows no significant lesions or deformities.   Nose:  External nasal examination shows no deformity or inflammation. Nasal mucosa are pink and moist without lesions ,exudates Oral exam: lips and gums are healthy appearing.There is no oropharyngeal erythema or exudate . Neck:  No thyromegaly, masses, tenderness noted.    Lungs:Chest clear to auscultation without wheezes, rhonchi,rales , rubs. Abdomen:Bowel sounds are normal. Abdomen is soft and nontender with no organomegaly, hernias,masses. GU: deferred as previously  addressed. Extremities:  No cyanosis, clubbing,edema  Neurologic exam : Strength equal  in upper & lower extremities Balance,Rhomberg,finger to nose testing could not be completed due to clinical state Deep tendon reflexes are equal Skin: Warm & dry w/o tenting. No significant lesions or rash.  See clinical summary under each active problem in the Problem List with associated updated therapeutic plan

## 2016-03-27 NOTE — Assessment & Plan Note (Addendum)
Insulin will be discontinued at discharge and low-dose  metformin initiated until he can follow-up with his PCP in one week as scheduled He will continue his low-carb diet

## 2016-03-27 NOTE — Assessment & Plan Note (Signed)
Clinically no evidence of sepsis at this time He will complete his Bactrim as of today

## 2016-03-27 NOTE — Assessment & Plan Note (Signed)
2 pack per day smoker until hospitalized 9/29 with sepsis He indicates he wants to remain nonsmoker Multiple options to help him remain non smoker discussed. These include nicotine patches, nicotine gum, and the new "E cigarette".

## 2016-03-27 NOTE — Assessment & Plan Note (Signed)
Rate is well controlled Full dose aspirin will be continued along with metoprolol 25 mg twice a day, prescription given

## 2016-03-27 NOTE — Patient Instructions (Signed)
Please see change in medications and discharge recommendations under each active problem/diagnosis

## 2016-03-27 NOTE — Assessment & Plan Note (Addendum)
Bactrim will be completed today  Dressings will be continued at home twice a day. Wound Care nurse has instructed wife. Follow-up with Drs.McDiarmid in 2 weeks as scheduled

## 2016-03-29 DIAGNOSIS — E1165 Type 2 diabetes mellitus with hyperglycemia: Secondary | ICD-10-CM | POA: Diagnosis not present

## 2016-03-29 DIAGNOSIS — S3130XD Unspecified open wound of scrotum and testes, subsequent encounter: Secondary | ICD-10-CM | POA: Diagnosis not present

## 2016-03-29 DIAGNOSIS — N493 Fournier gangrene: Secondary | ICD-10-CM | POA: Diagnosis not present

## 2016-03-29 DIAGNOSIS — I4891 Unspecified atrial fibrillation: Secondary | ICD-10-CM | POA: Diagnosis not present

## 2016-03-29 DIAGNOSIS — K760 Fatty (change of) liver, not elsewhere classified: Secondary | ICD-10-CM | POA: Diagnosis not present

## 2016-03-29 DIAGNOSIS — J449 Chronic obstructive pulmonary disease, unspecified: Secondary | ICD-10-CM | POA: Diagnosis not present

## 2016-03-31 DIAGNOSIS — E1165 Type 2 diabetes mellitus with hyperglycemia: Secondary | ICD-10-CM | POA: Diagnosis not present

## 2016-03-31 DIAGNOSIS — K760 Fatty (change of) liver, not elsewhere classified: Secondary | ICD-10-CM | POA: Diagnosis not present

## 2016-03-31 DIAGNOSIS — J449 Chronic obstructive pulmonary disease, unspecified: Secondary | ICD-10-CM | POA: Diagnosis not present

## 2016-03-31 DIAGNOSIS — N493 Fournier gangrene: Secondary | ICD-10-CM | POA: Diagnosis not present

## 2016-03-31 DIAGNOSIS — I4891 Unspecified atrial fibrillation: Secondary | ICD-10-CM | POA: Diagnosis not present

## 2016-03-31 DIAGNOSIS — S3130XD Unspecified open wound of scrotum and testes, subsequent encounter: Secondary | ICD-10-CM | POA: Diagnosis not present

## 2016-04-01 DIAGNOSIS — N493 Fournier gangrene: Secondary | ICD-10-CM | POA: Diagnosis not present

## 2016-04-01 DIAGNOSIS — E1165 Type 2 diabetes mellitus with hyperglycemia: Secondary | ICD-10-CM | POA: Diagnosis not present

## 2016-04-01 DIAGNOSIS — I4891 Unspecified atrial fibrillation: Secondary | ICD-10-CM | POA: Diagnosis not present

## 2016-04-01 DIAGNOSIS — K760 Fatty (change of) liver, not elsewhere classified: Secondary | ICD-10-CM | POA: Diagnosis not present

## 2016-04-01 DIAGNOSIS — J449 Chronic obstructive pulmonary disease, unspecified: Secondary | ICD-10-CM | POA: Diagnosis not present

## 2016-04-01 DIAGNOSIS — S3130XD Unspecified open wound of scrotum and testes, subsequent encounter: Secondary | ICD-10-CM | POA: Diagnosis not present

## 2016-04-03 DIAGNOSIS — T798XXA Other early complications of trauma, initial encounter: Secondary | ICD-10-CM | POA: Diagnosis not present

## 2016-04-03 DIAGNOSIS — R350 Frequency of micturition: Secondary | ICD-10-CM | POA: Diagnosis not present

## 2016-04-03 DIAGNOSIS — N289 Disorder of kidney and ureter, unspecified: Secondary | ICD-10-CM | POA: Diagnosis not present

## 2016-04-03 DIAGNOSIS — J449 Chronic obstructive pulmonary disease, unspecified: Secondary | ICD-10-CM | POA: Diagnosis not present

## 2016-04-03 DIAGNOSIS — I4891 Unspecified atrial fibrillation: Secondary | ICD-10-CM | POA: Diagnosis not present

## 2016-04-03 DIAGNOSIS — E119 Type 2 diabetes mellitus without complications: Secondary | ICD-10-CM | POA: Diagnosis not present

## 2016-04-03 DIAGNOSIS — E7800 Pure hypercholesterolemia, unspecified: Secondary | ICD-10-CM | POA: Diagnosis not present

## 2016-04-03 DIAGNOSIS — N493 Fournier gangrene: Secondary | ICD-10-CM | POA: Diagnosis not present

## 2016-04-03 DIAGNOSIS — R899 Unspecified abnormal finding in specimens from other organs, systems and tissues: Secondary | ICD-10-CM | POA: Diagnosis not present

## 2016-04-03 DIAGNOSIS — I129 Hypertensive chronic kidney disease with stage 1 through stage 4 chronic kidney disease, or unspecified chronic kidney disease: Secondary | ICD-10-CM | POA: Diagnosis not present

## 2016-04-03 DIAGNOSIS — Z23 Encounter for immunization: Secondary | ICD-10-CM | POA: Diagnosis not present

## 2016-04-03 DIAGNOSIS — I2510 Atherosclerotic heart disease of native coronary artery without angina pectoris: Secondary | ICD-10-CM | POA: Diagnosis not present

## 2016-04-04 DIAGNOSIS — J449 Chronic obstructive pulmonary disease, unspecified: Secondary | ICD-10-CM | POA: Diagnosis not present

## 2016-04-04 DIAGNOSIS — I4891 Unspecified atrial fibrillation: Secondary | ICD-10-CM | POA: Diagnosis not present

## 2016-04-04 DIAGNOSIS — S3130XD Unspecified open wound of scrotum and testes, subsequent encounter: Secondary | ICD-10-CM | POA: Diagnosis not present

## 2016-04-04 DIAGNOSIS — K760 Fatty (change of) liver, not elsewhere classified: Secondary | ICD-10-CM | POA: Diagnosis not present

## 2016-04-04 DIAGNOSIS — N493 Fournier gangrene: Secondary | ICD-10-CM | POA: Diagnosis not present

## 2016-04-04 DIAGNOSIS — E1165 Type 2 diabetes mellitus with hyperglycemia: Secondary | ICD-10-CM | POA: Diagnosis not present

## 2016-04-08 DIAGNOSIS — S3130XD Unspecified open wound of scrotum and testes, subsequent encounter: Secondary | ICD-10-CM | POA: Diagnosis not present

## 2016-04-08 DIAGNOSIS — J449 Chronic obstructive pulmonary disease, unspecified: Secondary | ICD-10-CM | POA: Diagnosis not present

## 2016-04-08 DIAGNOSIS — N493 Fournier gangrene: Secondary | ICD-10-CM | POA: Diagnosis not present

## 2016-04-08 DIAGNOSIS — E1165 Type 2 diabetes mellitus with hyperglycemia: Secondary | ICD-10-CM | POA: Diagnosis not present

## 2016-04-08 DIAGNOSIS — I4891 Unspecified atrial fibrillation: Secondary | ICD-10-CM | POA: Diagnosis not present

## 2016-04-08 DIAGNOSIS — K760 Fatty (change of) liver, not elsewhere classified: Secondary | ICD-10-CM | POA: Diagnosis not present

## 2016-04-10 DIAGNOSIS — J449 Chronic obstructive pulmonary disease, unspecified: Secondary | ICD-10-CM | POA: Diagnosis not present

## 2016-04-10 DIAGNOSIS — S3130XD Unspecified open wound of scrotum and testes, subsequent encounter: Secondary | ICD-10-CM | POA: Diagnosis not present

## 2016-04-10 DIAGNOSIS — E1165 Type 2 diabetes mellitus with hyperglycemia: Secondary | ICD-10-CM | POA: Diagnosis not present

## 2016-04-10 DIAGNOSIS — N493 Fournier gangrene: Secondary | ICD-10-CM | POA: Diagnosis not present

## 2016-04-10 DIAGNOSIS — I4891 Unspecified atrial fibrillation: Secondary | ICD-10-CM | POA: Diagnosis not present

## 2016-04-10 DIAGNOSIS — K760 Fatty (change of) liver, not elsewhere classified: Secondary | ICD-10-CM | POA: Diagnosis not present

## 2016-04-11 DIAGNOSIS — N509 Disorder of male genital organs, unspecified: Secondary | ICD-10-CM | POA: Diagnosis not present

## 2016-04-16 DIAGNOSIS — E1165 Type 2 diabetes mellitus with hyperglycemia: Secondary | ICD-10-CM | POA: Diagnosis not present

## 2016-04-16 DIAGNOSIS — N493 Fournier gangrene: Secondary | ICD-10-CM | POA: Diagnosis not present

## 2016-04-16 DIAGNOSIS — J449 Chronic obstructive pulmonary disease, unspecified: Secondary | ICD-10-CM | POA: Diagnosis not present

## 2016-04-16 DIAGNOSIS — K760 Fatty (change of) liver, not elsewhere classified: Secondary | ICD-10-CM | POA: Diagnosis not present

## 2016-04-16 DIAGNOSIS — S3130XD Unspecified open wound of scrotum and testes, subsequent encounter: Secondary | ICD-10-CM | POA: Diagnosis not present

## 2016-04-16 DIAGNOSIS — I4891 Unspecified atrial fibrillation: Secondary | ICD-10-CM | POA: Diagnosis not present

## 2016-04-23 DIAGNOSIS — E1165 Type 2 diabetes mellitus with hyperglycemia: Secondary | ICD-10-CM | POA: Diagnosis not present

## 2016-04-23 DIAGNOSIS — S3130XD Unspecified open wound of scrotum and testes, subsequent encounter: Secondary | ICD-10-CM | POA: Diagnosis not present

## 2016-04-23 DIAGNOSIS — N493 Fournier gangrene: Secondary | ICD-10-CM | POA: Diagnosis not present

## 2016-04-23 DIAGNOSIS — K760 Fatty (change of) liver, not elsewhere classified: Secondary | ICD-10-CM | POA: Diagnosis not present

## 2016-04-23 DIAGNOSIS — I4891 Unspecified atrial fibrillation: Secondary | ICD-10-CM | POA: Diagnosis not present

## 2016-04-23 DIAGNOSIS — J449 Chronic obstructive pulmonary disease, unspecified: Secondary | ICD-10-CM | POA: Diagnosis not present

## 2016-04-30 DIAGNOSIS — J449 Chronic obstructive pulmonary disease, unspecified: Secondary | ICD-10-CM | POA: Diagnosis not present

## 2016-04-30 DIAGNOSIS — I4891 Unspecified atrial fibrillation: Secondary | ICD-10-CM | POA: Diagnosis not present

## 2016-04-30 DIAGNOSIS — N493 Fournier gangrene: Secondary | ICD-10-CM | POA: Diagnosis not present

## 2016-04-30 DIAGNOSIS — S3130XD Unspecified open wound of scrotum and testes, subsequent encounter: Secondary | ICD-10-CM | POA: Diagnosis not present

## 2016-04-30 DIAGNOSIS — K760 Fatty (change of) liver, not elsewhere classified: Secondary | ICD-10-CM | POA: Diagnosis not present

## 2016-04-30 DIAGNOSIS — E1165 Type 2 diabetes mellitus with hyperglycemia: Secondary | ICD-10-CM | POA: Diagnosis not present

## 2016-05-07 DIAGNOSIS — E1165 Type 2 diabetes mellitus with hyperglycemia: Secondary | ICD-10-CM | POA: Diagnosis not present

## 2016-05-07 DIAGNOSIS — S3130XD Unspecified open wound of scrotum and testes, subsequent encounter: Secondary | ICD-10-CM | POA: Diagnosis not present

## 2016-05-07 DIAGNOSIS — N493 Fournier gangrene: Secondary | ICD-10-CM | POA: Diagnosis not present

## 2016-05-07 DIAGNOSIS — I4891 Unspecified atrial fibrillation: Secondary | ICD-10-CM | POA: Diagnosis not present

## 2016-05-07 DIAGNOSIS — J449 Chronic obstructive pulmonary disease, unspecified: Secondary | ICD-10-CM | POA: Diagnosis not present

## 2016-05-07 DIAGNOSIS — K760 Fatty (change of) liver, not elsewhere classified: Secondary | ICD-10-CM | POA: Diagnosis not present

## 2016-05-15 DIAGNOSIS — I4891 Unspecified atrial fibrillation: Secondary | ICD-10-CM | POA: Diagnosis not present

## 2016-05-15 DIAGNOSIS — N493 Fournier gangrene: Secondary | ICD-10-CM | POA: Diagnosis not present

## 2016-05-15 DIAGNOSIS — K760 Fatty (change of) liver, not elsewhere classified: Secondary | ICD-10-CM | POA: Diagnosis not present

## 2016-05-15 DIAGNOSIS — E1165 Type 2 diabetes mellitus with hyperglycemia: Secondary | ICD-10-CM | POA: Diagnosis not present

## 2016-05-15 DIAGNOSIS — S3130XD Unspecified open wound of scrotum and testes, subsequent encounter: Secondary | ICD-10-CM | POA: Diagnosis not present

## 2016-05-15 DIAGNOSIS — J449 Chronic obstructive pulmonary disease, unspecified: Secondary | ICD-10-CM | POA: Diagnosis not present

## 2016-05-20 DIAGNOSIS — K760 Fatty (change of) liver, not elsewhere classified: Secondary | ICD-10-CM | POA: Diagnosis not present

## 2016-05-20 DIAGNOSIS — N493 Fournier gangrene: Secondary | ICD-10-CM | POA: Diagnosis not present

## 2016-05-20 DIAGNOSIS — S3130XD Unspecified open wound of scrotum and testes, subsequent encounter: Secondary | ICD-10-CM | POA: Diagnosis not present

## 2016-05-20 DIAGNOSIS — J449 Chronic obstructive pulmonary disease, unspecified: Secondary | ICD-10-CM | POA: Diagnosis not present

## 2016-05-20 DIAGNOSIS — E1165 Type 2 diabetes mellitus with hyperglycemia: Secondary | ICD-10-CM | POA: Diagnosis not present

## 2016-05-20 DIAGNOSIS — I4891 Unspecified atrial fibrillation: Secondary | ICD-10-CM | POA: Diagnosis not present

## 2016-05-22 DIAGNOSIS — E119 Type 2 diabetes mellitus without complications: Secondary | ICD-10-CM | POA: Diagnosis not present

## 2016-05-22 DIAGNOSIS — I2510 Atherosclerotic heart disease of native coronary artery without angina pectoris: Secondary | ICD-10-CM | POA: Diagnosis not present

## 2016-05-22 DIAGNOSIS — I129 Hypertensive chronic kidney disease with stage 1 through stage 4 chronic kidney disease, or unspecified chronic kidney disease: Secondary | ICD-10-CM | POA: Diagnosis not present

## 2016-05-22 DIAGNOSIS — E7800 Pure hypercholesterolemia, unspecified: Secondary | ICD-10-CM | POA: Diagnosis not present

## 2016-05-22 DIAGNOSIS — J449 Chronic obstructive pulmonary disease, unspecified: Secondary | ICD-10-CM | POA: Diagnosis not present

## 2016-05-22 DIAGNOSIS — L0292 Furuncle, unspecified: Secondary | ICD-10-CM | POA: Diagnosis not present

## 2016-05-23 DIAGNOSIS — S3130XD Unspecified open wound of scrotum and testes, subsequent encounter: Secondary | ICD-10-CM | POA: Diagnosis not present

## 2016-05-23 DIAGNOSIS — J449 Chronic obstructive pulmonary disease, unspecified: Secondary | ICD-10-CM | POA: Diagnosis not present

## 2016-05-23 DIAGNOSIS — I4891 Unspecified atrial fibrillation: Secondary | ICD-10-CM | POA: Diagnosis not present

## 2016-05-23 DIAGNOSIS — N493 Fournier gangrene: Secondary | ICD-10-CM | POA: Diagnosis not present

## 2016-05-23 DIAGNOSIS — E1165 Type 2 diabetes mellitus with hyperglycemia: Secondary | ICD-10-CM | POA: Diagnosis not present

## 2016-05-23 DIAGNOSIS — K760 Fatty (change of) liver, not elsewhere classified: Secondary | ICD-10-CM | POA: Diagnosis not present

## 2016-05-26 DIAGNOSIS — R350 Frequency of micturition: Secondary | ICD-10-CM | POA: Diagnosis not present

## 2016-05-28 DIAGNOSIS — I4891 Unspecified atrial fibrillation: Secondary | ICD-10-CM | POA: Diagnosis not present

## 2016-05-28 DIAGNOSIS — N493 Fournier gangrene: Secondary | ICD-10-CM | POA: Diagnosis not present

## 2016-05-28 DIAGNOSIS — J449 Chronic obstructive pulmonary disease, unspecified: Secondary | ICD-10-CM | POA: Diagnosis not present

## 2016-05-28 DIAGNOSIS — E1165 Type 2 diabetes mellitus with hyperglycemia: Secondary | ICD-10-CM | POA: Diagnosis not present

## 2016-05-28 DIAGNOSIS — I2510 Atherosclerotic heart disease of native coronary artery without angina pectoris: Secondary | ICD-10-CM | POA: Diagnosis not present

## 2016-05-28 DIAGNOSIS — I10 Essential (primary) hypertension: Secondary | ICD-10-CM | POA: Diagnosis not present

## 2016-06-02 DIAGNOSIS — I10 Essential (primary) hypertension: Secondary | ICD-10-CM | POA: Diagnosis not present

## 2016-06-02 DIAGNOSIS — I4891 Unspecified atrial fibrillation: Secondary | ICD-10-CM | POA: Diagnosis not present

## 2016-06-02 DIAGNOSIS — E1165 Type 2 diabetes mellitus with hyperglycemia: Secondary | ICD-10-CM | POA: Diagnosis not present

## 2016-06-02 DIAGNOSIS — N493 Fournier gangrene: Secondary | ICD-10-CM | POA: Diagnosis not present

## 2016-06-02 DIAGNOSIS — I2510 Atherosclerotic heart disease of native coronary artery without angina pectoris: Secondary | ICD-10-CM | POA: Diagnosis not present

## 2016-06-02 DIAGNOSIS — J449 Chronic obstructive pulmonary disease, unspecified: Secondary | ICD-10-CM | POA: Diagnosis not present

## 2016-06-12 DIAGNOSIS — J449 Chronic obstructive pulmonary disease, unspecified: Secondary | ICD-10-CM | POA: Diagnosis not present

## 2016-06-12 DIAGNOSIS — I2510 Atherosclerotic heart disease of native coronary artery without angina pectoris: Secondary | ICD-10-CM | POA: Diagnosis not present

## 2016-06-12 DIAGNOSIS — I10 Essential (primary) hypertension: Secondary | ICD-10-CM | POA: Diagnosis not present

## 2016-06-12 DIAGNOSIS — I4891 Unspecified atrial fibrillation: Secondary | ICD-10-CM | POA: Diagnosis not present

## 2016-06-12 DIAGNOSIS — N493 Fournier gangrene: Secondary | ICD-10-CM | POA: Diagnosis not present

## 2016-06-12 DIAGNOSIS — E1165 Type 2 diabetes mellitus with hyperglycemia: Secondary | ICD-10-CM | POA: Diagnosis not present

## 2016-06-19 DIAGNOSIS — J449 Chronic obstructive pulmonary disease, unspecified: Secondary | ICD-10-CM | POA: Diagnosis not present

## 2016-06-19 DIAGNOSIS — I4891 Unspecified atrial fibrillation: Secondary | ICD-10-CM | POA: Diagnosis not present

## 2016-06-19 DIAGNOSIS — N493 Fournier gangrene: Secondary | ICD-10-CM | POA: Diagnosis not present

## 2016-06-19 DIAGNOSIS — I2510 Atherosclerotic heart disease of native coronary artery without angina pectoris: Secondary | ICD-10-CM | POA: Diagnosis not present

## 2016-06-19 DIAGNOSIS — E1165 Type 2 diabetes mellitus with hyperglycemia: Secondary | ICD-10-CM | POA: Diagnosis not present

## 2016-06-19 DIAGNOSIS — I10 Essential (primary) hypertension: Secondary | ICD-10-CM | POA: Diagnosis not present

## 2016-06-25 DIAGNOSIS — I10 Essential (primary) hypertension: Secondary | ICD-10-CM | POA: Diagnosis not present

## 2016-06-25 DIAGNOSIS — I2510 Atherosclerotic heart disease of native coronary artery without angina pectoris: Secondary | ICD-10-CM | POA: Diagnosis not present

## 2016-06-25 DIAGNOSIS — N493 Fournier gangrene: Secondary | ICD-10-CM | POA: Diagnosis not present

## 2016-06-25 DIAGNOSIS — I4891 Unspecified atrial fibrillation: Secondary | ICD-10-CM | POA: Diagnosis not present

## 2016-06-25 DIAGNOSIS — J449 Chronic obstructive pulmonary disease, unspecified: Secondary | ICD-10-CM | POA: Diagnosis not present

## 2016-06-25 DIAGNOSIS — E1165 Type 2 diabetes mellitus with hyperglycemia: Secondary | ICD-10-CM | POA: Diagnosis not present

## 2016-07-07 DIAGNOSIS — N493 Fournier gangrene: Secondary | ICD-10-CM | POA: Diagnosis not present

## 2016-07-07 DIAGNOSIS — I4891 Unspecified atrial fibrillation: Secondary | ICD-10-CM | POA: Diagnosis not present

## 2016-07-07 DIAGNOSIS — I10 Essential (primary) hypertension: Secondary | ICD-10-CM | POA: Diagnosis not present

## 2016-07-07 DIAGNOSIS — I2510 Atherosclerotic heart disease of native coronary artery without angina pectoris: Secondary | ICD-10-CM | POA: Diagnosis not present

## 2016-07-07 DIAGNOSIS — E1165 Type 2 diabetes mellitus with hyperglycemia: Secondary | ICD-10-CM | POA: Diagnosis not present

## 2016-07-07 DIAGNOSIS — J449 Chronic obstructive pulmonary disease, unspecified: Secondary | ICD-10-CM | POA: Diagnosis not present

## 2016-07-11 DIAGNOSIS — J449 Chronic obstructive pulmonary disease, unspecified: Secondary | ICD-10-CM | POA: Diagnosis not present

## 2016-07-11 DIAGNOSIS — E1165 Type 2 diabetes mellitus with hyperglycemia: Secondary | ICD-10-CM | POA: Diagnosis not present

## 2016-07-11 DIAGNOSIS — I10 Essential (primary) hypertension: Secondary | ICD-10-CM | POA: Diagnosis not present

## 2016-07-11 DIAGNOSIS — I4891 Unspecified atrial fibrillation: Secondary | ICD-10-CM | POA: Diagnosis not present

## 2016-07-11 DIAGNOSIS — I2510 Atherosclerotic heart disease of native coronary artery without angina pectoris: Secondary | ICD-10-CM | POA: Diagnosis not present

## 2016-07-11 DIAGNOSIS — N493 Fournier gangrene: Secondary | ICD-10-CM | POA: Diagnosis not present

## 2016-07-14 DIAGNOSIS — I4891 Unspecified atrial fibrillation: Secondary | ICD-10-CM | POA: Diagnosis not present

## 2016-07-14 DIAGNOSIS — E1165 Type 2 diabetes mellitus with hyperglycemia: Secondary | ICD-10-CM | POA: Diagnosis not present

## 2016-07-14 DIAGNOSIS — I2510 Atherosclerotic heart disease of native coronary artery without angina pectoris: Secondary | ICD-10-CM | POA: Diagnosis not present

## 2016-07-14 DIAGNOSIS — N493 Fournier gangrene: Secondary | ICD-10-CM | POA: Diagnosis not present

## 2016-07-14 DIAGNOSIS — J449 Chronic obstructive pulmonary disease, unspecified: Secondary | ICD-10-CM | POA: Diagnosis not present

## 2016-07-14 DIAGNOSIS — I10 Essential (primary) hypertension: Secondary | ICD-10-CM | POA: Diagnosis not present

## 2016-07-21 DIAGNOSIS — N501 Vascular disorders of male genital organs: Secondary | ICD-10-CM | POA: Diagnosis not present

## 2016-07-25 DIAGNOSIS — I4891 Unspecified atrial fibrillation: Secondary | ICD-10-CM | POA: Diagnosis not present

## 2016-07-25 DIAGNOSIS — J449 Chronic obstructive pulmonary disease, unspecified: Secondary | ICD-10-CM | POA: Diagnosis not present

## 2016-07-25 DIAGNOSIS — I2510 Atherosclerotic heart disease of native coronary artery without angina pectoris: Secondary | ICD-10-CM | POA: Diagnosis not present

## 2016-07-25 DIAGNOSIS — I10 Essential (primary) hypertension: Secondary | ICD-10-CM | POA: Diagnosis not present

## 2016-07-25 DIAGNOSIS — E1165 Type 2 diabetes mellitus with hyperglycemia: Secondary | ICD-10-CM | POA: Diagnosis not present

## 2016-07-25 DIAGNOSIS — N493 Fournier gangrene: Secondary | ICD-10-CM | POA: Diagnosis not present

## 2016-07-27 DIAGNOSIS — I4891 Unspecified atrial fibrillation: Secondary | ICD-10-CM | POA: Diagnosis not present

## 2016-07-27 DIAGNOSIS — N493 Fournier gangrene: Secondary | ICD-10-CM | POA: Diagnosis not present

## 2016-07-27 DIAGNOSIS — I2510 Atherosclerotic heart disease of native coronary artery without angina pectoris: Secondary | ICD-10-CM | POA: Diagnosis not present

## 2016-07-27 DIAGNOSIS — J449 Chronic obstructive pulmonary disease, unspecified: Secondary | ICD-10-CM | POA: Diagnosis not present

## 2016-07-27 DIAGNOSIS — E1165 Type 2 diabetes mellitus with hyperglycemia: Secondary | ICD-10-CM | POA: Diagnosis not present

## 2016-07-27 DIAGNOSIS — I10 Essential (primary) hypertension: Secondary | ICD-10-CM | POA: Diagnosis not present

## 2016-07-31 DIAGNOSIS — N493 Fournier gangrene: Secondary | ICD-10-CM | POA: Diagnosis not present

## 2016-07-31 DIAGNOSIS — I10 Essential (primary) hypertension: Secondary | ICD-10-CM | POA: Diagnosis not present

## 2016-07-31 DIAGNOSIS — I4891 Unspecified atrial fibrillation: Secondary | ICD-10-CM | POA: Diagnosis not present

## 2016-07-31 DIAGNOSIS — J449 Chronic obstructive pulmonary disease, unspecified: Secondary | ICD-10-CM | POA: Diagnosis not present

## 2016-07-31 DIAGNOSIS — I2510 Atherosclerotic heart disease of native coronary artery without angina pectoris: Secondary | ICD-10-CM | POA: Diagnosis not present

## 2016-07-31 DIAGNOSIS — E1165 Type 2 diabetes mellitus with hyperglycemia: Secondary | ICD-10-CM | POA: Diagnosis not present

## 2016-08-06 DIAGNOSIS — I2510 Atherosclerotic heart disease of native coronary artery without angina pectoris: Secondary | ICD-10-CM | POA: Diagnosis not present

## 2016-08-06 DIAGNOSIS — I4891 Unspecified atrial fibrillation: Secondary | ICD-10-CM | POA: Diagnosis not present

## 2016-08-06 DIAGNOSIS — I10 Essential (primary) hypertension: Secondary | ICD-10-CM | POA: Diagnosis not present

## 2016-08-06 DIAGNOSIS — E1165 Type 2 diabetes mellitus with hyperglycemia: Secondary | ICD-10-CM | POA: Diagnosis not present

## 2016-08-06 DIAGNOSIS — N493 Fournier gangrene: Secondary | ICD-10-CM | POA: Diagnosis not present

## 2016-08-06 DIAGNOSIS — J449 Chronic obstructive pulmonary disease, unspecified: Secondary | ICD-10-CM | POA: Diagnosis not present

## 2016-08-21 DIAGNOSIS — I129 Hypertensive chronic kidney disease with stage 1 through stage 4 chronic kidney disease, or unspecified chronic kidney disease: Secondary | ICD-10-CM | POA: Diagnosis not present

## 2016-08-21 DIAGNOSIS — J449 Chronic obstructive pulmonary disease, unspecified: Secondary | ICD-10-CM | POA: Diagnosis not present

## 2016-08-21 DIAGNOSIS — E119 Type 2 diabetes mellitus without complications: Secondary | ICD-10-CM | POA: Diagnosis not present

## 2016-10-01 DIAGNOSIS — N509 Disorder of male genital organs, unspecified: Secondary | ICD-10-CM | POA: Diagnosis not present

## 2016-10-15 DIAGNOSIS — Z6836 Body mass index (BMI) 36.0-36.9, adult: Secondary | ICD-10-CM | POA: Diagnosis not present

## 2016-10-15 DIAGNOSIS — Z125 Encounter for screening for malignant neoplasm of prostate: Secondary | ICD-10-CM | POA: Diagnosis not present

## 2016-10-15 DIAGNOSIS — E119 Type 2 diabetes mellitus without complications: Secondary | ICD-10-CM | POA: Diagnosis not present

## 2016-10-15 DIAGNOSIS — Z0000 Encounter for general adult medical examination without abnormal findings: Secondary | ICD-10-CM | POA: Diagnosis not present

## 2016-10-15 DIAGNOSIS — J449 Chronic obstructive pulmonary disease, unspecified: Secondary | ICD-10-CM | POA: Diagnosis not present

## 2016-10-15 DIAGNOSIS — I129 Hypertensive chronic kidney disease with stage 1 through stage 4 chronic kidney disease, or unspecified chronic kidney disease: Secondary | ICD-10-CM | POA: Diagnosis not present

## 2016-10-15 DIAGNOSIS — E7800 Pure hypercholesterolemia, unspecified: Secondary | ICD-10-CM | POA: Diagnosis not present

## 2016-10-15 DIAGNOSIS — I2510 Atherosclerotic heart disease of native coronary artery without angina pectoris: Secondary | ICD-10-CM | POA: Diagnosis not present

## 2016-10-30 DIAGNOSIS — C44519 Basal cell carcinoma of skin of other part of trunk: Secondary | ICD-10-CM | POA: Diagnosis not present

## 2016-10-30 DIAGNOSIS — C44619 Basal cell carcinoma of skin of left upper limb, including shoulder: Secondary | ICD-10-CM | POA: Diagnosis not present

## 2016-10-30 DIAGNOSIS — L820 Inflamed seborrheic keratosis: Secondary | ICD-10-CM | POA: Diagnosis not present

## 2016-11-27 DIAGNOSIS — E119 Type 2 diabetes mellitus without complications: Secondary | ICD-10-CM | POA: Diagnosis not present

## 2016-12-03 DIAGNOSIS — Z85828 Personal history of other malignant neoplasm of skin: Secondary | ICD-10-CM | POA: Diagnosis not present

## 2016-12-03 DIAGNOSIS — C4442 Squamous cell carcinoma of skin of scalp and neck: Secondary | ICD-10-CM | POA: Diagnosis not present

## 2016-12-03 DIAGNOSIS — L570 Actinic keratosis: Secondary | ICD-10-CM | POA: Diagnosis not present

## 2016-12-03 DIAGNOSIS — L814 Other melanin hyperpigmentation: Secondary | ICD-10-CM | POA: Diagnosis not present

## 2016-12-03 DIAGNOSIS — Z08 Encounter for follow-up examination after completed treatment for malignant neoplasm: Secondary | ICD-10-CM | POA: Diagnosis not present

## 2016-12-03 DIAGNOSIS — X32XXXD Exposure to sunlight, subsequent encounter: Secondary | ICD-10-CM | POA: Diagnosis not present

## 2017-01-22 DIAGNOSIS — Z7984 Long term (current) use of oral hypoglycemic drugs: Secondary | ICD-10-CM | POA: Diagnosis not present

## 2017-01-22 DIAGNOSIS — E119 Type 2 diabetes mellitus without complications: Secondary | ICD-10-CM | POA: Diagnosis not present

## 2017-04-28 DIAGNOSIS — E1165 Type 2 diabetes mellitus with hyperglycemia: Secondary | ICD-10-CM | POA: Diagnosis not present

## 2017-04-28 DIAGNOSIS — I2510 Atherosclerotic heart disease of native coronary artery without angina pectoris: Secondary | ICD-10-CM | POA: Diagnosis not present

## 2017-04-28 DIAGNOSIS — Z794 Long term (current) use of insulin: Secondary | ICD-10-CM | POA: Diagnosis not present

## 2017-04-28 DIAGNOSIS — E7800 Pure hypercholesterolemia, unspecified: Secondary | ICD-10-CM | POA: Diagnosis not present

## 2017-04-28 DIAGNOSIS — Z23 Encounter for immunization: Secondary | ICD-10-CM | POA: Diagnosis not present

## 2017-04-28 DIAGNOSIS — J449 Chronic obstructive pulmonary disease, unspecified: Secondary | ICD-10-CM | POA: Diagnosis not present

## 2017-04-28 DIAGNOSIS — I129 Hypertensive chronic kidney disease with stage 1 through stage 4 chronic kidney disease, or unspecified chronic kidney disease: Secondary | ICD-10-CM | POA: Diagnosis not present

## 2017-10-26 DIAGNOSIS — Z125 Encounter for screening for malignant neoplasm of prostate: Secondary | ICD-10-CM | POA: Diagnosis not present

## 2017-10-26 DIAGNOSIS — Z7984 Long term (current) use of oral hypoglycemic drugs: Secondary | ICD-10-CM | POA: Diagnosis not present

## 2017-10-26 DIAGNOSIS — E7800 Pure hypercholesterolemia, unspecified: Secondary | ICD-10-CM | POA: Diagnosis not present

## 2017-10-26 DIAGNOSIS — I129 Hypertensive chronic kidney disease with stage 1 through stage 4 chronic kidney disease, or unspecified chronic kidney disease: Secondary | ICD-10-CM | POA: Diagnosis not present

## 2017-10-26 DIAGNOSIS — J449 Chronic obstructive pulmonary disease, unspecified: Secondary | ICD-10-CM | POA: Diagnosis not present

## 2017-10-26 DIAGNOSIS — Z720 Tobacco use: Secondary | ICD-10-CM | POA: Diagnosis not present

## 2017-10-26 DIAGNOSIS — I2510 Atherosclerotic heart disease of native coronary artery without angina pectoris: Secondary | ICD-10-CM | POA: Diagnosis not present

## 2017-10-26 DIAGNOSIS — E1169 Type 2 diabetes mellitus with other specified complication: Secondary | ICD-10-CM | POA: Diagnosis not present

## 2017-10-26 DIAGNOSIS — Z0000 Encounter for general adult medical examination without abnormal findings: Secondary | ICD-10-CM | POA: Diagnosis not present

## 2017-12-01 DIAGNOSIS — K921 Melena: Secondary | ICD-10-CM | POA: Diagnosis not present

## 2018-02-01 DIAGNOSIS — E1169 Type 2 diabetes mellitus with other specified complication: Secondary | ICD-10-CM | POA: Diagnosis not present

## 2018-02-01 DIAGNOSIS — Z7984 Long term (current) use of oral hypoglycemic drugs: Secondary | ICD-10-CM | POA: Diagnosis not present

## 2018-02-22 DIAGNOSIS — C44229 Squamous cell carcinoma of skin of left ear and external auricular canal: Secondary | ICD-10-CM | POA: Diagnosis not present

## 2018-02-22 DIAGNOSIS — L570 Actinic keratosis: Secondary | ICD-10-CM | POA: Diagnosis not present

## 2018-02-22 DIAGNOSIS — X32XXXD Exposure to sunlight, subsequent encounter: Secondary | ICD-10-CM | POA: Diagnosis not present

## 2018-03-01 DIAGNOSIS — E119 Type 2 diabetes mellitus without complications: Secondary | ICD-10-CM | POA: Diagnosis not present

## 2018-03-17 DIAGNOSIS — W57XXXA Bitten or stung by nonvenomous insect and other nonvenomous arthropods, initial encounter: Secondary | ICD-10-CM | POA: Diagnosis not present

## 2018-03-17 DIAGNOSIS — Z23 Encounter for immunization: Secondary | ICD-10-CM | POA: Diagnosis not present

## 2018-03-17 DIAGNOSIS — L0390 Cellulitis, unspecified: Secondary | ICD-10-CM | POA: Diagnosis not present

## 2018-03-17 DIAGNOSIS — B379 Candidiasis, unspecified: Secondary | ICD-10-CM | POA: Diagnosis not present

## 2018-03-17 DIAGNOSIS — S70361A Insect bite (nonvenomous), right thigh, initial encounter: Secondary | ICD-10-CM | POA: Diagnosis not present

## 2018-05-05 ENCOUNTER — Other Ambulatory Visit: Payer: Self-pay | Admitting: Physician Assistant

## 2018-05-05 DIAGNOSIS — Z136 Encounter for screening for cardiovascular disorders: Secondary | ICD-10-CM

## 2018-05-05 DIAGNOSIS — J449 Chronic obstructive pulmonary disease, unspecified: Secondary | ICD-10-CM | POA: Diagnosis not present

## 2018-05-05 DIAGNOSIS — Z6836 Body mass index (BMI) 36.0-36.9, adult: Secondary | ICD-10-CM | POA: Diagnosis not present

## 2018-05-05 DIAGNOSIS — K5909 Other constipation: Secondary | ICD-10-CM | POA: Diagnosis not present

## 2018-05-05 DIAGNOSIS — Z7984 Long term (current) use of oral hypoglycemic drugs: Secondary | ICD-10-CM | POA: Diagnosis not present

## 2018-05-05 DIAGNOSIS — I129 Hypertensive chronic kidney disease with stage 1 through stage 4 chronic kidney disease, or unspecified chronic kidney disease: Secondary | ICD-10-CM | POA: Diagnosis not present

## 2018-05-05 DIAGNOSIS — Z23 Encounter for immunization: Secondary | ICD-10-CM | POA: Diagnosis not present

## 2018-05-05 DIAGNOSIS — E7800 Pure hypercholesterolemia, unspecified: Secondary | ICD-10-CM | POA: Diagnosis not present

## 2018-05-05 DIAGNOSIS — E1169 Type 2 diabetes mellitus with other specified complication: Secondary | ICD-10-CM | POA: Diagnosis not present

## 2018-05-05 DIAGNOSIS — I2510 Atherosclerotic heart disease of native coronary artery without angina pectoris: Secondary | ICD-10-CM | POA: Diagnosis not present

## 2018-05-18 ENCOUNTER — Ambulatory Visit
Admission: RE | Admit: 2018-05-18 | Discharge: 2018-05-18 | Disposition: A | Discharge status: Home / Self Care | Payer: Medicare Other | Source: Ambulatory Visit | Attending: Physician Assistant | Admitting: Physician Assistant

## 2018-05-18 DIAGNOSIS — Z136 Encounter for screening for cardiovascular disorders: Secondary | ICD-10-CM | POA: Diagnosis not present

## 2018-05-18 DIAGNOSIS — Z87891 Personal history of nicotine dependence: Secondary | ICD-10-CM | POA: Diagnosis not present

## 2018-06-23 ENCOUNTER — Other Ambulatory Visit: Payer: Self-pay | Admitting: Physician Assistant

## 2018-06-23 DIAGNOSIS — K219 Gastro-esophageal reflux disease without esophagitis: Secondary | ICD-10-CM | POA: Diagnosis not present

## 2018-06-23 DIAGNOSIS — R1032 Left lower quadrant pain: Secondary | ICD-10-CM | POA: Diagnosis not present

## 2018-06-23 DIAGNOSIS — K5909 Other constipation: Secondary | ICD-10-CM | POA: Diagnosis not present

## 2018-06-23 DIAGNOSIS — R1084 Generalized abdominal pain: Secondary | ICD-10-CM

## 2018-06-23 DIAGNOSIS — Z86010 Personal history of colonic polyps: Secondary | ICD-10-CM | POA: Diagnosis not present

## 2018-06-25 ENCOUNTER — Ambulatory Visit
Admission: RE | Admit: 2018-06-25 | Discharge: 2018-06-25 | Disposition: A | Discharge status: Home / Self Care | Payer: Medicare Other | Source: Ambulatory Visit | Attending: Physician Assistant | Admitting: Physician Assistant

## 2018-06-25 ENCOUNTER — Encounter: Payer: Self-pay | Admitting: Radiology

## 2018-06-25 DIAGNOSIS — R1084 Generalized abdominal pain: Secondary | ICD-10-CM

## 2018-06-25 DIAGNOSIS — R1032 Left lower quadrant pain: Secondary | ICD-10-CM

## 2018-06-25 DIAGNOSIS — R109 Unspecified abdominal pain: Secondary | ICD-10-CM | POA: Diagnosis not present

## 2018-06-25 MED ORDER — IOPAMIDOL (ISOVUE-300) INJECTION 61%
125.0000 mL | Freq: Once | INTRAVENOUS | Status: AC | PRN
  Administered 2018-06-25: 125 mL via INTRAVENOUS

## 2018-07-15 DIAGNOSIS — K5909 Other constipation: Secondary | ICD-10-CM | POA: Diagnosis not present

## 2018-07-15 DIAGNOSIS — Z86010 Personal history of colonic polyps: Secondary | ICD-10-CM | POA: Diagnosis not present

## 2018-07-15 DIAGNOSIS — R1032 Left lower quadrant pain: Secondary | ICD-10-CM | POA: Diagnosis not present

## 2018-07-19 DIAGNOSIS — K2950 Unspecified chronic gastritis without bleeding: Secondary | ICD-10-CM | POA: Diagnosis not present

## 2018-07-19 DIAGNOSIS — R1013 Epigastric pain: Secondary | ICD-10-CM | POA: Diagnosis not present

## 2018-07-19 DIAGNOSIS — R1012 Left upper quadrant pain: Secondary | ICD-10-CM | POA: Diagnosis not present

## 2018-10-28 DIAGNOSIS — I129 Hypertensive chronic kidney disease with stage 1 through stage 4 chronic kidney disease, or unspecified chronic kidney disease: Secondary | ICD-10-CM | POA: Diagnosis not present

## 2018-10-28 DIAGNOSIS — K2950 Unspecified chronic gastritis without bleeding: Secondary | ICD-10-CM | POA: Diagnosis not present

## 2018-10-28 DIAGNOSIS — E1169 Type 2 diabetes mellitus with other specified complication: Secondary | ICD-10-CM | POA: Diagnosis not present

## 2018-10-28 DIAGNOSIS — E7800 Pure hypercholesterolemia, unspecified: Secondary | ICD-10-CM | POA: Diagnosis not present

## 2018-10-28 DIAGNOSIS — I2510 Atherosclerotic heart disease of native coronary artery without angina pectoris: Secondary | ICD-10-CM | POA: Diagnosis not present

## 2018-10-28 DIAGNOSIS — Z125 Encounter for screening for malignant neoplasm of prostate: Secondary | ICD-10-CM | POA: Diagnosis not present

## 2018-10-28 DIAGNOSIS — Z1211 Encounter for screening for malignant neoplasm of colon: Secondary | ICD-10-CM | POA: Diagnosis not present

## 2018-10-28 DIAGNOSIS — Z0000 Encounter for general adult medical examination without abnormal findings: Secondary | ICD-10-CM | POA: Diagnosis not present

## 2018-10-28 DIAGNOSIS — N182 Chronic kidney disease, stage 2 (mild): Secondary | ICD-10-CM | POA: Diagnosis not present

## 2018-10-28 DIAGNOSIS — J449 Chronic obstructive pulmonary disease, unspecified: Secondary | ICD-10-CM | POA: Diagnosis not present

## 2018-10-28 DIAGNOSIS — Z794 Long term (current) use of insulin: Secondary | ICD-10-CM | POA: Diagnosis not present

## 2018-11-01 DIAGNOSIS — Z125 Encounter for screening for malignant neoplasm of prostate: Secondary | ICD-10-CM | POA: Diagnosis not present

## 2018-11-01 DIAGNOSIS — E7800 Pure hypercholesterolemia, unspecified: Secondary | ICD-10-CM | POA: Diagnosis not present

## 2018-11-01 DIAGNOSIS — E1169 Type 2 diabetes mellitus with other specified complication: Secondary | ICD-10-CM | POA: Diagnosis not present

## 2018-11-01 DIAGNOSIS — I129 Hypertensive chronic kidney disease with stage 1 through stage 4 chronic kidney disease, or unspecified chronic kidney disease: Secondary | ICD-10-CM | POA: Diagnosis not present

## 2018-11-01 DIAGNOSIS — Z1211 Encounter for screening for malignant neoplasm of colon: Secondary | ICD-10-CM | POA: Diagnosis not present

## 2018-11-01 DIAGNOSIS — Z794 Long term (current) use of insulin: Secondary | ICD-10-CM | POA: Diagnosis not present

## 2019-01-31 ENCOUNTER — Other Ambulatory Visit: Payer: Self-pay | Admitting: Genetic Counselor

## 2019-01-31 ENCOUNTER — Inpatient Hospital Stay: Payer: Medicare Other | Attending: Genetic Counselor | Admitting: Genetic Counselor

## 2019-01-31 DIAGNOSIS — Z803 Family history of malignant neoplasm of breast: Secondary | ICD-10-CM

## 2019-01-31 DIAGNOSIS — Z800 Family history of malignant neoplasm of digestive organs: Secondary | ICD-10-CM | POA: Diagnosis not present

## 2019-02-01 ENCOUNTER — Encounter: Payer: Self-pay | Admitting: Genetic Counselor

## 2019-02-01 DIAGNOSIS — E1165 Type 2 diabetes mellitus with hyperglycemia: Secondary | ICD-10-CM | POA: Diagnosis not present

## 2019-02-01 DIAGNOSIS — E1169 Type 2 diabetes mellitus with other specified complication: Secondary | ICD-10-CM | POA: Diagnosis not present

## 2019-02-01 DIAGNOSIS — Z7984 Long term (current) use of oral hypoglycemic drugs: Secondary | ICD-10-CM | POA: Diagnosis not present

## 2019-02-01 DIAGNOSIS — Z803 Family history of malignant neoplasm of breast: Secondary | ICD-10-CM | POA: Insufficient documentation

## 2019-02-01 DIAGNOSIS — Z800 Family history of malignant neoplasm of digestive organs: Secondary | ICD-10-CM | POA: Insufficient documentation

## 2019-02-01 NOTE — Progress Notes (Addendum)
REFERRING PROVIDER: Lennie Odor, PA-C [address] [place],  [place] [address]  PRIMARY PROVIDER:  Lennie Odor, PA-C  PRIMARY REASON FOR VISIT:  1. Family history of breast cancer   2. Family history of colon cancer      HISTORY OF PRESENT ILLNESS:   I connected with Jonathan Tucker on 02/01/2019 at 1 PM EDT by Webex video conference and verified that I am speaking with the correct person using two identifiers.   Patient location: Home Provider location: Office  Jonathan Tucker, a 68 y.o. male, was seen for a [facility] cancer genetics consultation at the request of Dr. Barrie Folk due to a family history of breast and colon cancer.  Jonathan Tucker presents to clinic today to discuss the possibility of a hereditary predisposition to cancer, genetic testing, and to further clarify his future cancer risks, as well as potential cancer risks for family members.   Jonathan Tucker is a 69 y.o. male with a personal history of basil cell skin cancer.  His daughter tested positive for a PMS2 mutation confirming a diagnosis of Lynch syndrome.  Jonathan Tucker is here to learn about his risk for Lynch syndrome and to undergo genetic testing.  CANCER HISTORY:  Oncology History   No history exists.     Past Medical History:  Diagnosis Date  . Diabetes mellitus without complication ([place])   . Family history of breast cancer   . Family history of colon cancer   . Skin cancer     Past Surgical History:  Procedure Laterality Date  . INCISION AND DRAINAGE ABSCESS N/A 03/14/2016   Procedure: IRRIGATION AND DRAINAGE ABSCESS OF PERINEAL AND SCROTUM, General Deep and Superficial areas;  Surgeon: Armandina Gemma, MD;  Location: [facility];  Service: General;  Laterality: N/A;  . INCISION AND DRAINAGE OF WOUND N/A 03/16/2016   Procedure: IRRIGATION AND DEBRIDEMENT WOUND PERIANAL AND SCROTAL;  Surgeon: Armandina Gemma, MD;  Location: [facility];  Service: General;  Laterality: N/A;  . IRRIGATION AND DEBRIDEMENT ABSCESS N/A  03/14/2016   Procedure: IRRIGATION AND DEBRIDEMENT OF PERINEAL AND SCROTAL ABSCESS, AND CYSTOSCOPY;  Surgeon: Bjorn Loser, MD;  Location: [facility];  Service: Urology;  Laterality: N/A;  . IRRIGATION AND DEBRIDEMENT ABSCESS N/A 03/18/2016   Procedure: DEBRIDEMENT AND PARTICAL CLOSURE PERINIAL WOUND;  Surgeon: Leighton Ruff, MD;  Location: [facility];  Service: General;  Laterality: N/A;    Social History   Socioeconomic History  . Marital status: Married    Spouse name: Not on file  . Number of children: Not on file  . Years of education: Not on file  . Highest education level: Not on file  Occupational History  . Not on file  Social Needs  . Financial resource strain: Not on file  . Food insecurity    Worry: Not on file    Inability: Not on file  . Transportation needs    Medical: Not on file    Non-medical: Not on file  Tobacco Use  . Smoking status: Current Some Day Smoker    Packs/day: 2.00  . Smokeless tobacco: Never Used  Substance and Sexual Activity  . Alcohol use: No  . Drug use: Not on file  . Sexual activity: Not on file  Lifestyle  . Physical activity    Days per week: Not on file    Minutes per session: Not on file  . Stress: Not on file  Relationships  . Social Herbalist on phone: Not  on file    Gets together: Not on file    Attends religious service: Not on file    Active member of club or organization: Not on file    Attends meetings of clubs or organizations: Not on file    Relationship status: Not on file  Other Topics Concern  . Not on file  Social History Narrative  . Not on file     FAMILY HISTORY:  We obtained a detailed, 4-generation family history.  Significant diagnoses are listed below: Family History  Problem Relation Age of Onset  . Skin cancer Mother   . Dementia Father   . Breast cancer Maternal Aunt        d. 64  . Cancer Maternal Uncle        unknown  . Cancer Paternal Aunt        unknown  . Breast cancer Maternal  Grandmother 86  . Breast cancer Maternal Aunt 75  . Colon cancer Cousin 63  . Breast cancer Cousin 79  . Colon cancer Cousin 31  . Cancer Cousin        unknown  . Brain cancer Cousin 39  . Cancer Cousin        unknown  . Colon cancer Daughter 63       Lynch syndrome - PMS2    The patient has a son and daughter.  His son has had skin cancer at 29 and his daughter was diagnosed with breast cancer at 79. She tested positive for a PMS2 hereditary mutations.  He had a brother and sister who are cancer free.  Both parents are deceased.  The patient's father died from complications of dementia.  He had a brother and two sisters.  One sister may have had cancer.  Both grandparents are deceased.  The patient's mother had skin cancer at 44.  She had six sisters and two brothers.  One sister had breast cancer and died at 61.  She had a son who had colon cancer at 45.  Another sister had breast cancer in her 18's and had a daughter with breast cancer in her 67's.  A sister who did not have cancer had a son with colon cancer who died at 51, a daughter with an unknown cancer and a son with brain cancer.  A brother had an unknown cancer and another brother had a son with an unknown cancer.  The maternal grandmother had breast cancer at 16.  Jonathan Tucker is aware of previous family history of genetic testing for hereditary cancer risks. Patient's maternal ancestors are of Caucasian descent, and paternal ancestors are of Caucasian descent. There is no reported Ashkenazi Jewish ancestry. There is no known consanguinity.  GENETIC COUNSELING ASSESSMENT: Jonathan Tucker is a 68 y.o. male with a family history of breast and colon cancer which is somewhat suggestive of a Lynch syndrome and predisposition to cancer given a cousin's young diagnosis of colon cancer, the family history of breast cancer and his daughter's genetic testing indicating a PMS2 mutation. We, therefore, discussed and recommended the following at today's  visit.   DISCUSSION: We discussed that Lynch syndrome is primarily a colorectal cancer syndrome, however, more recently we have found that families with breast cancer can sometimes test positive for Lynch syndrome as well, thereby extending the potential clinical spectrum of the disease.  Lynch syndrome is thought to be the most common hereditary colon cancer syndrome.  Based on his daughter's positive PMS2 mutation he has a 50%  chance of testing positive.  However, we discussed that his family history of cancer makes it likely that he will test positive, especially in light of a family history of young colon cancer.  We reviewed the cancer risks for Lynch syndrome based on PMS2 mutations.  The greatest risk is for colon cancer with up to a 20% lifetime risk.  For women, there is up to a 23% lifetime risk for uterine cancer and a 3% lifetime risk for ovarian cancer.  Other Lynch syndrome cancers have a lower risk with PMS2 mutations.  We reviewed the characteristics, features and inheritance patterns of hereditary cancer syndromes. We also discussed genetic testing, including the appropriate family members to test, the process of testing, insurance coverage and turn-around-time for results. We discussed the implications of a negative, positive and/or variant of uncertain significant result. We recommended Jonathan Tucker pursue genetic testing for the PMS2 pathogenic mutation identified in his daughter, as well as two VUS's she also was found to have.   Based on Jonathan Tucker family history of cancer, he meets medical criteria for genetic testing. Despite that he meets criteria, he may still have an out of pocket cost.   PLAN: After considering the risks, benefits, and limitations, Jonathan Tucker provided informed consent to pursue genetic testing and the blood sample was sent to [facility] for analysis of the family variant testing. Results should be available within approximately 2-3 weeks' time, at which  point they will be disclosed by telephone to Jonathan Tucker, as will any additional recommendations warranted by these results. Jonathan Tucker will receive a summary of his genetic counseling visit and a copy of his results once available. This information will also be available in Epic.   Lastly, we encouraged Jonathan Tucker to remain in contact with cancer genetics annually so that we can continuously update the family history and inform him of any changes in cancer genetics and testing that may be of benefit for this family.   Jonathan Tucker questions were answered to his satisfaction today. Our contact information was provided should additional questions or concerns arise. Thank you for the referral and allowing [place] to share in the care of your patient.   Cynai Skeens P. Florene Glen, [place], [facility] Licensed, Insurance risk surveyor [email or web address] phone: [phone]  The patient was seen for a total of 130 minutes in face-to-face genetic counseling.  This patient was discussed with Drs. Magrinat, Lindi Adie and/or Burr Medico who agrees with the above.    _______________________________________________________________________ For Office Staff:  Number of people involved in session: 2 Was an Intern/ student involved with case: no

## 2019-02-03 ENCOUNTER — Other Ambulatory Visit: Payer: Self-pay

## 2019-02-03 ENCOUNTER — Inpatient Hospital Stay: Payer: Medicare Other

## 2019-02-03 DIAGNOSIS — Z803 Family history of malignant neoplasm of breast: Secondary | ICD-10-CM

## 2019-02-11 ENCOUNTER — Telehealth: Payer: Self-pay | Admitting: Genetic Counselor

## 2019-02-11 ENCOUNTER — Encounter: Payer: Self-pay | Admitting: Genetic Counselor

## 2019-02-11 ENCOUNTER — Ambulatory Visit: Payer: Self-pay | Admitting: Genetic Counselor

## 2019-02-11 DIAGNOSIS — Z1379 Encounter for other screening for genetic and chromosomal anomalies: Secondary | ICD-10-CM | POA: Insufficient documentation

## 2019-02-11 NOTE — Progress Notes (Addendum)
HPI:  Jonathan Tucker was previously seen in the [facility] due to a family history of cancer and a known pathogenic mutation in PMS2 in his daughter and concerns regarding a hereditary predisposition to cancer. Please refer to our prior cancer genetics clinic note for more information regarding our discussion, assessment and recommendations, at the time. Jonathan Tucker recent genetic test results were disclosed to him, as were recommendations warranted by these results. These results and recommendations are discussed in more detail below.  CANCER HISTORY:  Oncology History   No history exists.    FAMILY HISTORY:  We obtained a detailed, 4-generation family history.  Significant diagnoses are listed below: Family History  Problem Relation Age of Onset   Skin cancer Mother    Dementia Father    Breast cancer Maternal Aunt        d. 53   Cancer Maternal Uncle        unknown   Cancer Paternal Aunt        unknown   Breast cancer Maternal Grandmother 30   Breast cancer Maternal Aunt 8   Colon cancer Cousin 79   Breast cancer Cousin 68   Colon cancer Cousin 20   Cancer Cousin        unknown   Brain cancer Cousin 81   Cancer Cousin        unknown   Colon cancer Daughter 22       Lynch syndrome - PMS2    The patient has a son and daughter.  His son has had skin cancer at 32 and his daughter was diagnosed with breast cancer at 61. She tested positive for a PMS2 hereditary mutations.  He had a brother and sister who are cancer free.  Both parents are deceased.   The patient's father died from complications of dementia.  He had a brother and two sisters.  One sister may have had cancer.  Both grandparents are deceased.   The patient's mother had skin cancer at 31.  She had six sisters and two brothers.  One sister had breast cancer and died at 70.  She had a son who had colon cancer at 5.  Another sister had breast cancer in her 74's and had a daughter with breast cancer in  her 104's.  A sister who did not have cancer had a son with colon cancer who died at 35, a daughter with an unknown cancer and a son with brain cancer.  A brother had an unknown cancer and another brother had a son with an unknown cancer.  The maternal grandmother had breast cancer at 13.   Jonathan Tucker is aware of previous family history of genetic testing for hereditary cancer risks. Patient's maternal ancestors are of Caucasian descent, and paternal ancestors are of Caucasian descent. There is no reported Ashkenazi Jewish ancestry. There is no known consanguinity.   GENETIC TEST RESULTS: We recommended Jonathan Tucker pursue testing for the familial hereditary cancer gene mutation called PMS2, Deletion (Exons 5-9), ATM c.133C>T and POLE c.1280C>T. Mr. Maragh test was normal and did not reveal the familial PMS2 mutation. We call this result a true negative result because the cancer-causing mutation was identified in Jonathan Tucker family, and he did not inherit it.  Given this negative result, Jonathan Tucker chances of developing Lynch syndrome-related cancers are the same as they are in the general population.      We discussed with Jonathan Tucker that because current genetic testing is  not perfect, it is possible there may be a gene mutation in one of these genes that current testing cannot detect, but that chance is small.  We also discussed, that there could be another gene that has not yet been discovered, or that we have not yet tested, that is responsible for the cancer diagnoses in the family. It is also possible there is a hereditary cause for the cancer in the family that Jonathan Tucker did not inherit and therefore was not identified in his testing.  Therefore, it is important to remain in touch with cancer genetics in the future so that we can continue to offer Jonathan Tucker the most up to date genetic testing.   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  At this time, it is unknown if these variants are associated with increased cancer risk or if they are normal findings, but most variants such as these get reclassified to being inconsequential. They should not be used to make medical management decisions. With time, we suspect the lab will determine the significance of these variants, if any. If we do learn more about them, we will try to contact Jonathan Tucker to discuss it further. However, it is important to stay in touch with [place] periodically and keep the address and phone number up to date.  UPDATE: ATM c.133C>T VUS has been reclassified to Benign.  The amended report date is December 22, 2022.  ADDITIONAL GENETIC TESTING: We discussed with Jonathan Tucker that there are other genes that are associated with increased cancer risk that can be analyzed. Should Jonathan Tucker wish to pursue additional genetic testing, we are happy to discuss and coordinate this testing, at any time.    CANCER SCREENING RECOMMENDATIONS: Jonathan Tucker test result is considered negative (normal).  This means that we have not identified a hereditary cause for his family history of cancer at this time. Most cancers happen by chance and this negative test suggests that his cancer may fall into this category.    While reassuring, this does not definitively rule out a hereditary predisposition to cancer. It is still possible that there could be genetic mutations that are undetectable by current technology. There could be genetic mutations in genes that have not been tested or identified to increase cancer risk.  Therefore, it is recommended he continue to follow the cancer management and screening guidelines provided by his primary healthcare provider.   An individual's cancer risk and medical management are not determined by genetic test results alone. Overall cancer risk assessment incorporates additional factors, including personal medical  history, family history, and any available genetic information that may result in a personalized plan for cancer prevention and surveillance  RECOMMENDATIONS FOR FAMILY MEMBERS:  Individuals in this family might be at some increased risk of developing cancer, over the general population risk, simply due to the family history of cancer.  We recommended women in this family have a yearly mammogram beginning at age 40, or 101 years younger than the earliest onset of cancer, an annual clinical breast exam, and perform monthly breast self-exams. Women in this family should also have a gynecological exam as recommended by their primary provider. All family members should have a colonoscopy by age 52.  FOLLOW-UP: Lastly, we discussed with Jonathan Tucker that cancer genetics is a rapidly advancing field and it is possible that new genetic tests will  be appropriate for him and/or his family members in the future. We encouraged him to remain in contact with cancer genetics on an annual basis so we can update his personal and family histories and let him know of advances in cancer genetics that may benefit this family.   Our contact number was provided. Jonathan Tucker questions were answered to his satisfaction, and he knows he is welcome to call us at anytime with additional questions or concerns.   Maylon Cos, MS, [facility] Licensed, Certified Genetic Counselor [email or web address]

## 2019-02-11 NOTE — Telephone Encounter (Signed)
Spoke with wife about his testing.  He was negative for the known familial pathogenic variant in PMS2, but did have both VUS found in his daughter.  No medical management changes will be made.

## 2019-05-16 DIAGNOSIS — K219 Gastro-esophageal reflux disease without esophagitis: Secondary | ICD-10-CM | POA: Diagnosis not present

## 2019-05-16 DIAGNOSIS — J449 Chronic obstructive pulmonary disease, unspecified: Secondary | ICD-10-CM | POA: Diagnosis not present

## 2019-05-16 DIAGNOSIS — E1169 Type 2 diabetes mellitus with other specified complication: Secondary | ICD-10-CM | POA: Diagnosis not present

## 2019-05-16 DIAGNOSIS — I4891 Unspecified atrial fibrillation: Secondary | ICD-10-CM | POA: Diagnosis not present

## 2019-05-16 DIAGNOSIS — I2510 Atherosclerotic heart disease of native coronary artery without angina pectoris: Secondary | ICD-10-CM | POA: Diagnosis not present

## 2019-05-16 DIAGNOSIS — E7800 Pure hypercholesterolemia, unspecified: Secondary | ICD-10-CM | POA: Diagnosis not present

## 2019-05-16 DIAGNOSIS — I129 Hypertensive chronic kidney disease with stage 1 through stage 4 chronic kidney disease, or unspecified chronic kidney disease: Secondary | ICD-10-CM | POA: Diagnosis not present

## 2019-05-16 DIAGNOSIS — Z23 Encounter for immunization: Secondary | ICD-10-CM | POA: Diagnosis not present

## 2019-08-22 DIAGNOSIS — E1169 Type 2 diabetes mellitus with other specified complication: Secondary | ICD-10-CM | POA: Diagnosis not present

## 2020-01-04 DIAGNOSIS — K611 Rectal abscess: Secondary | ICD-10-CM | POA: Diagnosis not present

## 2020-01-04 DIAGNOSIS — Z86010 Personal history of colonic polyps: Secondary | ICD-10-CM | POA: Diagnosis not present

## 2020-01-04 DIAGNOSIS — K5901 Slow transit constipation: Secondary | ICD-10-CM | POA: Diagnosis not present

## 2020-01-12 DIAGNOSIS — L258 Unspecified contact dermatitis due to other agents: Secondary | ICD-10-CM | POA: Diagnosis not present

## 2020-01-12 DIAGNOSIS — D045 Carcinoma in situ of skin of trunk: Secondary | ICD-10-CM | POA: Diagnosis not present

## 2020-01-31 ENCOUNTER — Emergency Department (HOSPITAL_COMMUNITY): Payer: Medicare Other

## 2020-01-31 ENCOUNTER — Observation Stay (HOSPITAL_COMMUNITY): Payer: Medicare Other | Admitting: Anesthesiology

## 2020-01-31 ENCOUNTER — Other Ambulatory Visit: Payer: Self-pay

## 2020-01-31 ENCOUNTER — Inpatient Hospital Stay (HOSPITAL_COMMUNITY)
Admission: EM | Admit: 2020-01-31 | Discharge: 2020-02-05 | DRG: 907 | Disposition: A | Discharge status: Home / Self Care | Payer: Medicare Other | Attending: Internal Medicine | Admitting: Internal Medicine

## 2020-01-31 ENCOUNTER — Encounter (HOSPITAL_COMMUNITY)
Admission: EM | Disposition: A | Discharge status: Home / Self Care | Payer: Self-pay | Source: Home / Self Care | Attending: Internal Medicine

## 2020-01-31 ENCOUNTER — Encounter (HOSPITAL_COMMUNITY): Payer: Self-pay | Admitting: Emergency Medicine

## 2020-01-31 DIAGNOSIS — Z7984 Long term (current) use of oral hypoglycemic drugs: Secondary | ICD-10-CM

## 2020-01-31 DIAGNOSIS — Z808 Family history of malignant neoplasm of other organs or systems: Secondary | ICD-10-CM

## 2020-01-31 DIAGNOSIS — D120 Benign neoplasm of cecum: Secondary | ICD-10-CM | POA: Diagnosis not present

## 2020-01-31 DIAGNOSIS — K922 Gastrointestinal hemorrhage, unspecified: Secondary | ICD-10-CM | POA: Diagnosis present

## 2020-01-31 DIAGNOSIS — IMO0002 Reserved for concepts with insufficient information to code with codable children: Secondary | ICD-10-CM | POA: Diagnosis present

## 2020-01-31 DIAGNOSIS — Z800 Family history of malignant neoplasm of digestive organs: Secondary | ICD-10-CM

## 2020-01-31 DIAGNOSIS — I10 Essential (primary) hypertension: Secondary | ICD-10-CM | POA: Diagnosis present

## 2020-01-31 DIAGNOSIS — F17210 Nicotine dependence, cigarettes, uncomplicated: Secondary | ICD-10-CM | POA: Diagnosis not present

## 2020-01-31 DIAGNOSIS — D123 Benign neoplasm of transverse colon: Secondary | ICD-10-CM | POA: Diagnosis not present

## 2020-01-31 DIAGNOSIS — I480 Paroxysmal atrial fibrillation: Secondary | ICD-10-CM | POA: Diagnosis present

## 2020-01-31 DIAGNOSIS — E1165 Type 2 diabetes mellitus with hyperglycemia: Secondary | ICD-10-CM | POA: Diagnosis present

## 2020-01-31 DIAGNOSIS — K625 Hemorrhage of anus and rectum: Secondary | ICD-10-CM

## 2020-01-31 DIAGNOSIS — K635 Polyp of colon: Secondary | ICD-10-CM | POA: Diagnosis present

## 2020-01-31 DIAGNOSIS — Z888 Allergy status to other drugs, medicaments and biological substances status: Secondary | ICD-10-CM

## 2020-01-31 DIAGNOSIS — K91840 Postprocedural hemorrhage and hematoma of a digestive system organ or structure following a digestive system procedure: Principal | ICD-10-CM | POA: Diagnosis present

## 2020-01-31 DIAGNOSIS — K644 Residual hemorrhoidal skin tags: Secondary | ICD-10-CM | POA: Diagnosis present

## 2020-01-31 DIAGNOSIS — Z803 Family history of malignant neoplasm of breast: Secondary | ICD-10-CM

## 2020-01-31 DIAGNOSIS — K5521 Angiodysplasia of colon with hemorrhage: Secondary | ICD-10-CM | POA: Diagnosis not present

## 2020-01-31 DIAGNOSIS — E1151 Type 2 diabetes mellitus with diabetic peripheral angiopathy without gangrene: Secondary | ICD-10-CM | POA: Diagnosis not present

## 2020-01-31 DIAGNOSIS — K633 Ulcer of intestine: Secondary | ICD-10-CM | POA: Diagnosis not present

## 2020-01-31 DIAGNOSIS — Y838 Other surgical procedures as the cause of abnormal reaction of the patient, or of later complication, without mention of misadventure at the time of the procedure: Secondary | ICD-10-CM | POA: Diagnosis present

## 2020-01-31 DIAGNOSIS — D62 Acute posthemorrhagic anemia: Secondary | ICD-10-CM | POA: Diagnosis not present

## 2020-01-31 DIAGNOSIS — R109 Unspecified abdominal pain: Secondary | ICD-10-CM | POA: Diagnosis not present

## 2020-01-31 DIAGNOSIS — K5730 Diverticulosis of large intestine without perforation or abscess without bleeding: Secondary | ICD-10-CM | POA: Diagnosis not present

## 2020-01-31 DIAGNOSIS — Z7982 Long term (current) use of aspirin: Secondary | ICD-10-CM

## 2020-01-31 DIAGNOSIS — D128 Benign neoplasm of rectum: Secondary | ICD-10-CM | POA: Diagnosis not present

## 2020-01-31 DIAGNOSIS — Z20822 Contact with and (suspected) exposure to covid-19: Secondary | ICD-10-CM | POA: Diagnosis not present

## 2020-01-31 DIAGNOSIS — J449 Chronic obstructive pulmonary disease, unspecified: Secondary | ICD-10-CM | POA: Diagnosis present

## 2020-01-31 DIAGNOSIS — Z85828 Personal history of other malignant neoplasm of skin: Secondary | ICD-10-CM

## 2020-01-31 DIAGNOSIS — R578 Other shock: Secondary | ICD-10-CM | POA: Diagnosis not present

## 2020-01-31 DIAGNOSIS — R000 Tachycardia, unspecified: Secondary | ICD-10-CM | POA: Diagnosis not present

## 2020-01-31 DIAGNOSIS — K921 Melena: Secondary | ICD-10-CM | POA: Diagnosis not present

## 2020-01-31 DIAGNOSIS — K648 Other hemorrhoids: Secondary | ICD-10-CM | POA: Diagnosis present

## 2020-01-31 DIAGNOSIS — Z79899 Other long term (current) drug therapy: Secondary | ICD-10-CM

## 2020-01-31 DIAGNOSIS — I700 Atherosclerosis of aorta: Secondary | ICD-10-CM | POA: Diagnosis not present

## 2020-01-31 DIAGNOSIS — R140 Abdominal distension (gaseous): Secondary | ICD-10-CM | POA: Diagnosis not present

## 2020-01-31 DIAGNOSIS — Z885 Allergy status to narcotic agent status: Secondary | ICD-10-CM

## 2020-01-31 DIAGNOSIS — Z86010 Personal history of colonic polyps: Secondary | ICD-10-CM | POA: Diagnosis not present

## 2020-01-31 HISTORY — PX: HEMOSTASIS CLIP PLACEMENT: SHX6857

## 2020-01-31 HISTORY — PX: COLONOSCOPY WITH PROPOFOL: SHX5780

## 2020-01-31 HISTORY — PX: SCLEROTHERAPY: SHX6841

## 2020-01-31 LAB — COMPREHENSIVE METABOLIC PANEL
ALT: 18 U/L (ref 0–44)
AST: 20 U/L (ref 15–41)
Albumin: 3.4 g/dL — ABNORMAL LOW (ref 3.5–5.0)
Alkaline Phosphatase: 26 U/L — ABNORMAL LOW (ref 38–126)
Anion gap: 12 (ref 5–15)
BUN: 13 mg/dL (ref 8–23)
CO2: 23 mmol/L (ref 22–32)
Calcium: 9 mg/dL (ref 8.9–10.3)
Chloride: 103 mmol/L (ref 98–111)
Creatinine, Ser: 1 mg/dL (ref 0.61–1.24)
GFR calc Af Amer: >60 mL/min (ref >60)
GFR calc non Af Amer: >60 mL/min (ref >60)
Glucose, Bld: 230 mg/dL — ABNORMAL HIGH (ref 70–99)
Potassium: 4 mmol/L (ref 3.5–5.1)
Sodium: 138 mmol/L (ref 135–145)
Total Bilirubin: 0.9 mg/dL (ref 0.3–1.2)
Total Protein: 6.4 g/dL — ABNORMAL LOW (ref 6.5–8.1)

## 2020-01-31 LAB — PREPARE RBC (CROSSMATCH)

## 2020-01-31 LAB — CBC
HCT: 38.4 % — ABNORMAL LOW (ref 39.0–52.0)
Hemoglobin: 11.7 g/dL — ABNORMAL LOW (ref 13.0–17.0)
MCH: 29.1 pg (ref 26.0–34.0)
MCHC: 30.5 g/dL (ref 30.0–36.0)
MCV: 95.5 fL (ref 80.0–100.0)
Platelets: 310 10*3/uL (ref 150–400)
RBC: 4.02 MIL/uL — ABNORMAL LOW (ref 4.22–5.81)
RDW: 14 % (ref 11.5–15.5)
WBC: 15.6 10*3/uL — ABNORMAL HIGH (ref 4.0–10.5)
nRBC: 0 % (ref 0.0–0.2)

## 2020-01-31 LAB — GLUCOSE, CAPILLARY: Glucose-Capillary: 109 mg/dL — ABNORMAL HIGH (ref 70–99)

## 2020-01-31 LAB — SARS CORONAVIRUS 2 BY RT PCR (HOSPITAL ORDER, PERFORMED IN ~~LOC~~ HOSPITAL LAB): SARS Coronavirus 2: NEGATIVE

## 2020-01-31 LAB — ABO/RH: ABO/RH(D): A NEG

## 2020-01-31 SURGERY — COLONOSCOPY WITH PROPOFOL
Anesthesia: Monitor Anesthesia Care

## 2020-01-31 MED ORDER — SODIUM CHLORIDE 0.9 % IV SOLN: INTRAVENOUS | Status: AC

## 2020-01-31 MED ORDER — ONDANSETRON HCL 4 MG PO TABS: 4.0000 mg | ORAL_TABLET | Freq: Four times a day (QID) | ORAL | Status: DC | PRN

## 2020-01-31 MED ORDER — TRANEXAMIC ACID 1000 MG/10ML IV SOLN
500.0000 mg | Freq: Once | INTRAVENOUS | Status: AC
  Administered 2020-01-31: 500 mg via TOPICAL
  Filled 2020-01-31: qty 10

## 2020-01-31 MED ORDER — LACTATED RINGERS IV SOLN: INTRAVENOUS | Status: DC | PRN

## 2020-01-31 MED ORDER — INSULIN ASPART 100 UNIT/ML ~~LOC~~ SOLN
0.0000 [IU] | SUBCUTANEOUS | Status: DC
  Administered 2020-02-01: 2 [IU] via SUBCUTANEOUS
  Administered 2020-02-02 – 2020-02-04 (×6): 1 [IU] via SUBCUTANEOUS

## 2020-01-31 MED ORDER — LIDOCAINE HCL 1 % IJ SOLN
INTRAMUSCULAR | Status: DC | PRN
  Administered 2020-01-31: 100 mg via INTRADERMAL

## 2020-01-31 MED ORDER — IOHEXOL 300 MG/ML  SOLN
100.0000 mL | Freq: Once | INTRAMUSCULAR | Status: AC | PRN
  Administered 2020-01-31: 100 mL via INTRAVENOUS

## 2020-01-31 MED ORDER — SODIUM CHLORIDE 0.9 % IV SOLN
10.0000 mL/h | Freq: Once | INTRAVENOUS | Status: AC
  Administered 2020-01-31: 10 mL/h via INTRAVENOUS

## 2020-01-31 MED ORDER — ACETAMINOPHEN 325 MG PO TABS
650.0000 mg | ORAL_TABLET | Freq: Four times a day (QID) | ORAL | Status: DC | PRN
  Administered 2020-02-01 – 2020-02-04 (×6): 650 mg via ORAL
  Filled 2020-01-31 (×6): qty 2

## 2020-01-31 MED ORDER — SODIUM CHLORIDE 0.9% IV SOLUTION: Freq: Once | INTRAVENOUS | Status: AC

## 2020-01-31 MED ORDER — SODIUM CHLORIDE 0.9 % IV SOLN
80.0000 mg | Freq: Once | INTRAVENOUS | Status: AC
  Administered 2020-01-31: 80 mg via INTRAVENOUS
  Filled 2020-01-31: qty 80

## 2020-01-31 MED ORDER — ONDANSETRON HCL 4 MG/2ML IJ SOLN: 4.0000 mg | Freq: Four times a day (QID) | INTRAMUSCULAR | Status: DC | PRN

## 2020-01-31 MED ORDER — SODIUM CHLORIDE 0.9 % IV BOLUS
1000.0000 mL | Freq: Once | INTRAVENOUS | Status: AC
  Administered 2020-01-31: 1000 mL via INTRAVENOUS

## 2020-01-31 MED ORDER — PROPOFOL 10 MG/ML IV BOLUS
INTRAVENOUS | Status: DC | PRN
  Administered 2020-01-31: 30 mg via INTRAVENOUS
  Administered 2020-01-31 (×3): 50 mg via INTRAVENOUS
  Administered 2020-01-31: 40 mg via INTRAVENOUS
  Administered 2020-01-31: 30 mg via INTRAVENOUS
  Administered 2020-02-01: 50 mg via INTRAVENOUS

## 2020-01-31 MED ORDER — ACETAMINOPHEN 650 MG RE SUPP: 650.0000 mg | Freq: Four times a day (QID) | RECTAL | Status: DC | PRN

## 2020-01-31 SURGICAL SUPPLY — 22 items

## 2020-01-31 NOTE — ED Triage Notes (Signed)
Patient had colonoscopy this AM. Presents with sever abdominal pain and reports rectal bleeding. Patient pale and diaphoretic in triage.

## 2020-01-31 NOTE — ED Notes (Signed)
Blood transfusion completed at this time.

## 2020-01-31 NOTE — H&P (Signed)
History and Physical    Jonathan Tucker RUE:[id number] DOB: [birth year] DOA: 01/31/2020  PCP: Lennie Odor, PA  Patient coming from: Home.  Chief Complaint: Rectal bleeding.  HPI: Jonathan Tucker is a 69 y.o. male with history of A. fib, diabetes mellitus, hypertension, hyperlipidemia was brought to the ER after patient had multiple episodes of frank rectal bleeding.  Patient had a colonoscopy done this morning and had 8 polyps removed.  At around 2 PM patient started the pain frank rectal bleeding with abdominal discomfort.  Patient presents to the ER.  ED Course: While waiting in the ER patient had a syncopal episode did not strike his head.  Did not have any chest pain.  Patient was found to be hypotensive in the 14N systolic was given fluid bolus following which blood pressure improved.  Hemoglobin is around 11.7.  The last one in our system was around 11.4 in 2017.  Dr. Michail Sermon on-call gastroenterologist was consulted.  CT abdomen pelvis did not show anything acute.  Covid test was negative.  Plan is to take patient to urgent colonoscopy.  EKG shows sinus tachycardia.  2 units of PRBC transfusion has been ordered.  Review of Systems: As per HPI, rest all negative.   Past Medical History:  Diagnosis Date  . Diabetes mellitus without complication ([place])   . Family history of breast cancer   . Family history of colon cancer   . Skin cancer     Past Surgical History:  Procedure Laterality Date  . INCISION AND DRAINAGE ABSCESS N/A 03/14/2016   Procedure: IRRIGATION AND DRAINAGE ABSCESS OF PERINEAL AND SCROTUM, General Deep and Superficial areas;  Surgeon: Armandina Gemma, MD;  Location: [facility];  Service: General;  Laterality: N/A;  . INCISION AND DRAINAGE OF WOUND N/A 03/16/2016   Procedure: IRRIGATION AND DEBRIDEMENT WOUND PERIANAL AND SCROTAL;  Surgeon: Armandina Gemma, MD;  Location: [facility];  Service: General;  Laterality: N/A;  . IRRIGATION AND DEBRIDEMENT ABSCESS N/A 03/14/2016   Procedure:  IRRIGATION AND DEBRIDEMENT OF PERINEAL AND SCROTAL ABSCESS, AND CYSTOSCOPY;  Surgeon: Bjorn Loser, MD;  Location: [facility];  Service: Urology;  Laterality: N/A;  . IRRIGATION AND DEBRIDEMENT ABSCESS N/A 03/18/2016   Procedure: DEBRIDEMENT AND PARTICAL CLOSURE PERINIAL WOUND;  Surgeon: Leighton Ruff, MD;  Location: [facility];  Service: General;  Laterality: N/A;     reports that he has been smoking. He has been smoking about 2.00 packs per day. He has never used smokeless tobacco. He reports that he does not drink alcohol. No history on file for drug use.  Allergies  Allergen Reactions  . Codeine     Does not tolerate, tolerates hydrocodone   . Flexeril [Cyclobenzaprine]     Burning/itching  . Tape Other (See Comments)    Per patient "it burns and blisters"    Family History  Problem Relation Age of Onset  . Skin cancer Mother   . Dementia Father   . Breast cancer Maternal Aunt        d. 82  . Cancer Maternal Uncle        unknown  . Cancer Paternal Aunt        unknown  . Breast cancer Maternal Grandmother 86  . Breast cancer Maternal Aunt 75  . Colon cancer Cousin 100  . Breast cancer Cousin 5  . Colon cancer Cousin 31  . Cancer Cousin        unknown  . Brain cancer Cousin 39  . Cancer  Cousin        unknown  . Colon cancer Daughter 64       Lynch syndrome - PMS2    Prior to Admission medications   Medication Sig Start Date End Date Taking? Authorizing Provider  aspirin EC 325 MG tablet Take 325 mg by mouth every evening.   Yes [provider]  calcium carbonate (TUMS - DOSED IN MG ELEMENTAL CALCIUM) 500 MG chewable tablet Chew 1 tablet by mouth daily as needed for indigestion or heartburn.   Yes [provider]  colesevelam (WELCHOL) 625 MG tablet Take 1,250 mg by mouth 2 (two) times daily with a meal.   Yes [provider]  fenofibrate (TRICOR) 145 MG tablet Take 145 mg by mouth every evening.   Yes [provider]  hydrochlorothiazide  (HYDRODIURIL) 25 MG tablet Take 25 mg by mouth daily.   Yes [provider]  Insulin Degludec (TRESIBA) 100 UNIT/ML SOLN Inject 36 Units into the skin every evening.   Yes [provider]  metFORMIN (GLUCOPHAGE) 500 MG tablet 1 after the largest meal 03/27/16  Yes Hendricks Limes, MD  metoprolol tartrate (LOPRESSOR) 25 MG tablet Take 1 tablet (25 mg total) by mouth 2 (two) times daily. 03/27/16  Yes Hendricks Limes, MD  naproxen sodium (ANAPROX) 220 MG tablet Take 440 mg by mouth daily as needed (pain).   Yes [provider]  ramipril (ALTACE) 10 MG capsule Take 10 mg by mouth every evening.   Yes [provider]  rosuvastatin (CRESTOR) 5 MG tablet Take 5 mg by mouth every evening.   Yes [provider]  TRULICITY 1.5 AS/5.0NL SOPN Inject 1.5 mg into the skin once a week.  08/15/19  Yes [provider]  senna-docusate (SENOKOT-S) 8.6-50 MG tablet Take 1 tablet by mouth at bedtime as needed for mild constipation. Patient not taking: Reported on 01/31/2020 03/25/16   Dhungel, Flonnie Overman, MD  sulfamethoxazole-trimethoprim (BACTRIM DS,SEPTRA DS) 800-160 MG tablet Take 1 tablet by mouth every 12 (twelve) hours. Patient not taking: Reported on 01/31/2020 03/25/16   Louellen Molder, MD    Physical Exam: Constitutional: Moderately built and nourished. Vitals:   01/31/20 2101 01/31/20 2102 01/31/20 2116 01/31/20 2218  BP: 136/84  (!) 151/94 (!) 145/82  Pulse: 98 98 97 93  Resp: _0 Temp:   98.3 F (36.8 C) 98.6 F (37 C)  TempSrc:   Oral Oral  SpO2: 96% 96% 96% 94%  Weight:      Height:       Eyes: Anicteric no pallor. ENMT: No discharge from the ears eyes nose or mouth. Neck: No mass felt.  No neck rigidity. Respiratory: No rhonchi or crepitations. Cardiovascular: S1-S2 heard. Abdomen: Soft nontender bowel sounds present. Musculoskeletal: No edema. Skin: No rash. Neurologic: Alert awake oriented to time place and person.  Moves  all extremities. Psychiatric: Appears normal.  Normal affect.   Labs on Admission: I have personally reviewed following labs and imaging studies  CBC: Recent Labs  Lab 01/31/20 1734  WBC 15.6*  HGB 11.7*  HCT 38.4*  MCV 95.5  PLT 976   Basic Metabolic Panel: Recent Labs  Lab 01/31/20 1734  NA 138  K 4.0  CL 103  CO2 23  GLUCOSE 230*  BUN 13  CREATININE 1.00  CALCIUM 9.0   GFR: Estimated Creatinine Clearance: 75.4 mL/min (by C-G formula based on SCr of 1 mg/dL). Liver Function Tests: Recent Labs  Lab 01/31/20 1734  AST 20  ALT 18  ALKPHOS 26*  BILITOT 0.9  PROT 6.4*  ALBUMIN 3.4*   No results for input(s): LIPASE, AMYLASE in the last 168 hours. No results for input(s): AMMONIA in the last 168 hours. Coagulation Profile: No results for input(s): INR, PROTIME in the last 168 hours. Cardiac Enzymes: No results for input(s): CKTOTAL, CKMB, CKMBINDEX, TROPONINI in the last 168 hours. BNP (last 3 results) No results for input(s): PROBNP in the last 8760 hours. HbA1C: No results for input(s): HGBA1C in the last 72 hours. CBG: No results for input(s): GLUCAP in the last 168 hours. Lipid Profile: No results for input(s): CHOL, HDL, LDLCALC, TRIG, CHOLHDL, LDLDIRECT in the last 72 hours. Thyroid Function Tests: No results for input(s): TSH, T4TOTAL, FREET4, T3FREE, THYROIDAB in the last 72 hours. Anemia Panel: No results for input(s): VITAMINB12, FOLATE, FERRITIN, TIBC, IRON, RETICCTPCT in the last 72 hours. Urine analysis:    Component Value Date/Time   COLORURINE YELLOW 03/14/2016 2002   APPEARANCEUR CLEAR 03/14/2016 2002   LABSPEC 1.028 03/14/2016 2002   PHURINE 6.0 03/14/2016 2002   GLUCOSEU NEGATIVE 03/14/2016 2002   HGBUR TRACE (A) 03/14/2016 2002   BILIRUBINUR NEGATIVE 03/14/2016 2002   KETONESUR NEGATIVE 03/14/2016 2002   PROTEINUR 30 (A) 03/14/2016 2002   NITRITE NEGATIVE 03/14/2016 2002   LEUKOCYTESUR NEGATIVE 03/14/2016 2002   Sepsis  Labs: _0 (procalcitonin:4,lacticidven:4) ) Recent Results (from the past 240 hour(s))  SARS Coronavirus 2 by RT PCR (hospital order, performed in [place] hospital lab) Nasopharyngeal Nasopharyngeal Swab     Status: None   Collection Time: 01/31/20  6:34 PM   Specimen: Nasopharyngeal Swab  Result Value Ref Range Status   SARS Coronavirus 2 NEGATIVE NEGATIVE Final    Comment: (NOTE) SARS-CoV-2 target nucleic acids are NOT DETECTED.  The SARS-CoV-2 RNA is generally detectable in upper and lower respiratory specimens during the acute phase of infection. The lowest concentration of SARS-CoV-2 viral copies this assay can detect is 250 copies / mL. A negative result does not preclude SARS-CoV-2 infection and should not be used as the sole basis for treatment or other patient management decisions.  A negative result may occur with improper specimen collection / handling, submission of specimen other than nasopharyngeal swab, presence of viral mutation(s) within the areas targeted by this assay, and inadequate number of viral copies (<250 copies / mL). A negative result must be combined with clinical observations, patient history, and epidemiological information.  Fact Sheet for Patients:   StrictlyIdeas.no  Fact Sheet for Healthcare Providers: BankingDealers.co.za  This test is not yet approved or  cleared by the [place] FDA and has been authorized for detection and/or diagnosis of SARS-CoV-2 by FDA under an Emergency Use Authorization (EUA).  This EUA will remain in effect (meaning this test can be used) for the duration of the COVID-19 declaration under Section 564(b)(1) of the Act, 21 U.S.C. section 360bbb-3(b)(1), unless the authorization is terminated or revoked sooner.  Performed at [facility], [place] [address], [place], [place] [address]      Radiological Exams on Admission: CT ABDOMEN PELVIS W  CONTRAST  Result Date: 01/31/2020 CLINICAL DATA:  Abdominal distension and rectal bleeding after colonoscopy today EXAM: CT ABDOMEN AND PELVIS WITH CONTRAST TECHNIQUE: Multidetector CT imaging of the abdomen and pelvis was performed using the standard protocol following bolus administration of intravenous contrast. CONTRAST:  162m OMNIPAQUE IOHEXOL 300 MG/ML  SOLN COMPARISON:  01/31/2020 FINDINGS: Lower chest: No acute pleural or parenchymal lung  disease. Hepatobiliary: No focal liver abnormality is seen. No gallstones, gallbladder wall thickening, or biliary dilatation. Pancreas: Unremarkable. No pancreatic ductal dilatation or surrounding inflammatory changes. Spleen: Normal in size without focal abnormality. Adrenals/Urinary Tract: Adrenal glands are unremarkable. Kidneys are normal, without renal calculi, focal lesion, or hydronephrosis. Bladder is unremarkable. Stomach/Bowel: No bowel obstruction or ileus. Normal appendix right lower quadrant. No bowel wall thickening or inflammatory change. Vascular/Lymphatic: Aortic atherosclerosis. No enlarged abdominal or pelvic lymph nodes. Reproductive: Prostate is unremarkable. Other: No free fluid or free intra-abdominal gas. No abdominal wall hernia. Musculoskeletal: No acute or destructive bony lesion. Reconstructed images demonstrate no additional findings. IMPRESSION: 1. No evidence of complication after colonoscopy. No bowel perforation. 2. No acute intra-abdominal or intrapelvic process. 3.  Aortic Atherosclerosis (ICD10-I70.0). Electronically Signed   By: Randa Ngo M.D.   On: 01/31/2020 19:26   DG Chest Port 1 View  Result Date: 01/31/2020 CLINICAL DATA:  Abdominal pain and rectal bleeding after colonoscopy EXAM: PORTABLE CHEST 1 VIEW COMPARISON:  03/16/2016 FINDINGS: 2 frontal views of the chest demonstrate an unremarkable cardiac silhouette. No airspace disease, effusion, or pneumothorax. No evidence of free gas in the greater peritoneal sac.  IMPRESSION: 1. No acute intrathoracic process. Electronically Signed   By: Randa Ngo M.D.   On: 01/31/2020 19:43   DG Abd Portable 1 View  Result Date: 01/31/2020 CLINICAL DATA:  Abdominal pain and rectal bleeding status post colonoscopy. EXAM: PORTABLE ABDOMEN - 1 VIEW COMPARISON:  None. FINDINGS: The bowel gas pattern is normal. There is no evidence of free air. No radio-opaque calculi or other significant radiographic abnormality are seen. IMPRESSION: Negative. Electronically Signed   By: Virgina Norfolk M.D.   On: 01/31/2020 18:48    EKG: Independently reviewed.  Sinus tachycardia.  Assessment/Plan Principal Problem:   Acute GI bleeding Active Problems:   Uncontrolled diabetes mellitus (HCC)   COPD (chronic obstructive pulmonary disease) (HCC)   Essential hypertension   PAF (paroxysmal atrial fibrillation) (HCC)   Acute blood loss anemia    1. Acute GI bleeding likely from the polypectomy which was done this morning during colonoscopy.  Dr. Michail Sermon gastroenterologist with whom I discussed this plan to take patient for urgent colonoscopy to locate the bleed.  We will keep patient n.p.o. patient is receiving 2 units of PRBC transfusion. 2. Acute blood loss anemia receiving 2 units of PRBC transfusion follow CBC. 3. COPD not actively wheezing. 4. History of paroxysmal atrial fibrillation not on anticoagulation.  Takes aspirin.  Which is on hold.  Holding beta-blockers due to hypotension. 5. Diabetes mellitus type 2 we will keep patient on sliding scale coverage for now. 6. Previous history of Fournier's gangrene.   DVT prophylaxis: SCDs.  Due to severe GI bleed will avoid anticoagulation. Code Status: Full code. Family Communication: Patient's wife. Disposition Plan: Home. Consults called: Gastroenterologist. Admission status: Observation.   Rise Patience MD [facility] Pager [phone].  If 7PM-7AM, please contact  night-coverage [email or web address] Password [facility]  01/31/2020, 10:32 PM

## 2020-01-31 NOTE — ED Provider Notes (Signed)
[place] EMERGENCY DEPARTMENT Provider Note   CSN: [id number] Arrival date & time: 01/31/20  1700     History Chief Complaint  Patient presents with  . Abdominal Pain    Jonathan Tucker is a 69 y.o. male hx of diabetes here presenting with abdominal pain and rectal bleeding.  Patient just had a colonoscopy done earlier today by Dr. Watt Climes.  Patient states that he had 7 polyps removed.  He states that he left the outpatient center around 11 AM.  Starting about 2 PM, he had bright red blood per rectum about five episodes and diffuse abdominal cramps.  He states that he feels like he moved passed out so came in the ER.  On arrival, patient was noted to be pale and diaphoretic and blood pressure in the low 100s.  Also was noted to have bright red blood per rectum.  Patient is not on any blood thinners right now.  The history is provided by the patient.       Past Medical History:  Diagnosis Date  . Diabetes mellitus without complication ([place])   . Family history of breast cancer   . Family history of colon cancer   . Skin cancer     Patient Active Problem List   Diagnosis Date Noted  . Genetic testing 02/11/2019  . Family history of breast cancer   . Family history of colon cancer   . AKI (acute kidney injury) ([place])   . Diabetes mellitus due to underlying condition ([place])   . Atrial fibrillation with RVR ([place]) 03/17/2016  . Essential hypertension 03/17/2016  . Dyslipidemia 03/17/2016  . Uncontrolled diabetes mellitus ([place]) 03/14/2016  . Fournier's gangrene in male 03/14/2016  . Hyponatremia 03/14/2016  . Leukocytosis 03/14/2016  . Acute renal insufficiency 03/14/2016  . Former smoker 03/14/2016  . Severe sepsis ([place]) 03/14/2016  . Rigors 03/14/2016  . COPD (chronic obstructive pulmonary disease) ([place]) 03/14/2016    Past Surgical History:  Procedure Laterality Date  . INCISION AND DRAINAGE ABSCESS N/A 03/14/2016   Procedure: IRRIGATION AND DRAINAGE  ABSCESS OF PERINEAL AND SCROTUM, General Deep and Superficial areas;  Surgeon: Armandina Gemma, MD;  Location[facility];  Service: General;  Laterality: N/A;  . INCISION AND DRAINAGE OF WOUND N/A 03/16/2016   Procedure: IRRIGATION AND DEBRIDEMENT WOUND PERIANAL AND SCROTAL;  Surgeon: Armandina Gemma, MD;  [facility];  Service: General;  Laterality: N/A;  . IRRIGATION AND DEBRIDEMENT ABSCESS N/A 03/14/2016   Procedure: IRRIGATION AND DEBRIDEMENT OF PERINEAL AND SCROTAL ABSCESS, AND CYSTOSCOPY;  Surgeon: Bjorn Loser, MD;  [facility];  Service: Urology;  Laterality: N/A;  . IRRIGATION AND DEBRIDEMENT ABSCESS N/A 03/18/2016   Procedure: DEBRIDEMENT AND PARTICAL CLOSURE PERINIAL WOUND;  Surgeon: Leighton Ruff, MD;  Location[facility];  Service: General;  Laterality: N/A;       Family History  Problem Relation Age of Onset  . Skin cancer Mother   . Dementia Father   . Breast cancer Maternal Aunt        d. 41  . Cancer Maternal Uncle        unknown  . Cancer Paternal Aunt        unknown  . Breast cancer Maternal Grandmother 86  . Breast cancer Maternal Aunt 75  . Colon cancer Cousin 51  . Breast cancer Cousin 24  . Colon cancer Cousin 93  . Cancer Cousin        unknown  . Brain cancer Cousin 39  .  Cancer Cousin        unknown  . Colon cancer Daughter 69       Lynch syndrome - PMS2    Social History   Tobacco Use  . Smoking status: Current Some Day Smoker    Packs/day: 2.00  . Smokeless tobacco: Never Used  Substance Use Topics  . Alcohol use: No  . Drug use: Not on file    Home Medications Prior to Admission medications   Medication Sig Start Date End Date Taking? Authorizing Provider  aspirin EC 325 MG tablet Take 325 mg by mouth every evening.    [provider]  calcium carbonate (TUMS - DOSED IN MG ELEMENTAL CALCIUM) 500 MG chewable tablet Chew 1 tablet by mouth daily as needed for indigestion or heartburn.    [provider]  colesevelam (WELCHOL)  625 MG tablet Take 1,250 mg by mouth 2 (two) times daily with a meal.    [provider]  fenofibrate (TRICOR) 145 MG tablet Take 145 mg by mouth every evening.    [provider]  hydrochlorothiazide (HYDRODIURIL) 25 MG tablet Take 25 mg by mouth daily.    [provider]  metFORMIN (GLUCOPHAGE) 500 MG tablet 1 after the largest meal 03/27/16   Hendricks Limes, MD  metoprolol tartrate (LOPRESSOR) 25 MG tablet Take 1 tablet (25 mg total) by mouth 2 (two) times daily. 03/27/16   Hendricks Limes, MD  naproxen sodium (ANAPROX) 220 MG tablet Take 440 mg by mouth daily as needed (pain).    [provider]  ramipril (ALTACE) 10 MG capsule Take 10 mg by mouth every evening.    [provider]  rosuvastatin (CRESTOR) 5 MG tablet Take 5 mg by mouth every evening.    [provider]  senna-docusate (SENOKOT-S) 8.6-50 MG tablet Take 1 tablet by mouth at bedtime as needed for mild constipation. 03/25/16   Dhungel, Flonnie Overman, MD  sulfamethoxazole-trimethoprim (BACTRIM DS,SEPTRA DS) 800-160 MG tablet Take 1 tablet by mouth every 12 (twelve) hours. Patient taking differently: Take 1 tablet by mouth every 12 (twelve) hours. Last day 03/28/16 03/25/16   Louellen Molder, MD  traMADol (ULTRAM) 50 MG tablet 1-2 pills prior to dressing change as needed for pain Note: He has been taking oxycodone at skilled nursing facility without difficulty 03/27/16   Hendricks Limes, MD    Allergies    Codeine, Flexeril [cyclobenzaprine], and Tape  Review of Systems   Review of Systems  Gastrointestinal: Positive for abdominal pain and blood in stool.  All other systems reviewed and are negative.   Physical Exam Updated Vital Signs BP 102/70   Pulse 99   Temp 98 F (36.7 C) (Oral)   Resp 15   Ht 6' (1.829 m)   Wt 86.2 kg   SpO2 93%   BMI 25.77 kg/m   Physical Exam Vitals and nursing note reviewed.  Constitutional:      Comments: Pale, diaphoretic    HENT:     Head: Normocephalic.  Eyes:     Extraocular Movements: Extraocular movements intact.  Cardiovascular:     Rate and Rhythm: Tachycardia present.  Pulmonary:     Effort: Pulmonary effort is normal.     Breath sounds: Normal breath sounds.  Abdominal:     Comments: Diffuse tenderness,  No rebound   Genitourinary:    Comments: Rectal- bright red blood with clots  Skin:    General: Skin is warm.     Capillary Refill: Capillary  refill takes less than 2 seconds.  Neurological:     General: No focal deficit present.     Mental Status: He is oriented to person, place, and time.  Psychiatric:        Mood and Affect: Mood normal.        Behavior: Behavior normal.     ED Results / Procedures / Treatments   Labs (all labs ordered are listed, but only abnormal results are displayed) Labs Reviewed  COMPREHENSIVE METABOLIC PANEL - Abnormal; Notable for the following components:      Result Value   Glucose, Bld 230 (*)    Total Protein 6.4 (*)    Albumin 3.4 (*)    Alkaline Phosphatase 26 (*)    All other components within normal limits  CBC - Abnormal; Notable for the following components:   WBC 15.6 (*)    RBC 4.02 (*)    Hemoglobin 11.7 (*)    HCT 38.4 (*)    All other components within normal limits  SARS CORONAVIRUS 2 BY RT PCR (HOSPITAL ORDER, [facility])  POC OCCULT BLOOD, ED  TYPE AND SCREEN  PREPARE RBC (CROSSMATCH)  ABO/RH    EKG EKG Interpretation  Date/Time:  Tuesday January 31 2020 17:21:22 EDT Ventricular Rate:  112 PR Interval:  186 QRS Duration: 78 QT Interval:  342 QTC Calculation: 466 R Axis:   -54 Text Interpretation: Sinus tachycardia Left anterior fascicular block Abnormal ECG rate slower since previous Confirmed by Wandra Arthurs [phone]) on 01/31/2020 5:55:26 PM   Radiology No results found.  Procedures Procedures (including critical care time)  CRITICAL CARE Performed by: Wandra Arthurs   Total critical care  time: 30 minutes  Critical care time was exclusive of separately billable procedures and treating other patients.  Critical care was necessary to treat or prevent imminent or life-threatening deterioration.  Critical care was time spent personally by me on the following activities: development of treatment plan with patient and/or surrogate as well as nursing, discussions with consultants, evaluation of patient's response to treatment, examination of patient, obtaining history from patient or surrogate, ordering and performing treatments and interventions, ordering and review of laboratory studies, ordering and review of radiographic studies, pulse oximetry and re-evaluation of patient's condition.   Medications Ordered in ED Medications  0.9 %  sodium chloride infusion (has no administration in time range)  pantoprazole (PROTONIX) 80 mg in sodium chloride 0.9 % 100 mL IVPB (has no administration in time range)  tranexamic acid (CYKLOKAPRON) 1000 MG/10ML topical solution 500 mg (has no administration in time range)    ED Course  I have reviewed the triage vital signs and the nursing notes.  Pertinent labs & imaging results that were available during my care of the patient were reviewed by me and considered in my medical decision making (see chart for details).    MDM Rules/Calculators/A&P                         Jonathan Tucker is a 69 y.o. male here with red blood per rectum after colonoscopy.  Concern for possible perforation versus bleeding from colon polyp or removal.  I consult Dr. Michail Sermon on arrival, his initial hemoglobin is eleven.  However since he is actively bleeding and diaphoretic and borderline hypotensive, I have ordered O- transfusion.  Dr. Michail Sermon recommended urgent CT scan to rule out perforation.  He will see patient.  8:33 PM Patient  still receiving blood but his blood pressure is up to the 120s now.  His CT did not show any perforation.  Hospitalist to admit patient.   We will keep patient n.p.o. and Dr. Michail Sermon to see patient later in the evening and decide on timing of colonoscopy. Will admit for lower GI bleed     Final Clinical Impression(s) / ED Diagnoses Final diagnoses:  None    Rx / DC Orders ED Discharge Orders    None       Drenda Freeze, MD 01/31/20 2034

## 2020-01-31 NOTE — H&P (View-Only) (Signed)
Referring Provider: Dr. Darl Householder Primary Care Physician:  Lennie Odor, PA Primary Gastroenterologist:  Dr. Watt Climes  Reason for Consultation:  GI bleed; Post-polypectomy bleed  HPI: Jonathan Tucker is a 69 y.o. male who is s/p a colonoscopy this morning where he had 7 small semi-sessile polyps removed from the rectum, descending colon, and transverse colon and one medium-sized semi-sessile polyp removed from the cecum and all were removed with snare cautery. Diffuse diverticulosis was noted along with internal hemorrhoids. Fair prep on colonoscopy. Following the procedure he went home and ate some scrambled eggs and then had profuse amount of bright red blood per rectum with clots that occurred multiple times. Had lower abdominal pain as well and felt lightheaded. Initially in the ER his BP was in the low 100's. Hgb 11.7. Not on any anticoagulation. Wife at bedside. He is in Trendelenburg position and SBP in 150's. Wife at bedside.  Past Medical History:  Diagnosis Date   Diabetes mellitus without complication ([place])    Family history of breast cancer    Family history of colon cancer    Skin cancer     Past Surgical History:  Procedure Laterality Date   INCISION AND DRAINAGE ABSCESS N/A 03/14/2016   Procedure: IRRIGATION AND DRAINAGE ABSCESS OF PERINEAL AND SCROTUM, General Deep and Superficial areas;  Surgeon: Armandina Gemma, MD;  Location: [facility];  Service: General;  Laterality: N/A;   INCISION AND DRAINAGE OF WOUND N/A 03/16/2016   Procedure: IRRIGATION AND DEBRIDEMENT WOUND PERIANAL AND SCROTAL;  Surgeon: Armandina Gemma, MD;  Location: [facility];  Service: General;  Laterality: N/A;   IRRIGATION AND DEBRIDEMENT ABSCESS N/A 03/14/2016   Procedure: IRRIGATION AND DEBRIDEMENT OF PERINEAL AND SCROTAL ABSCESS, AND CYSTOSCOPY;  Surgeon: Bjorn Loser, MD;  Location: [facility];  Service: Urology;  Laterality: N/A;   IRRIGATION AND DEBRIDEMENT ABSCESS N/A 03/18/2016   Procedure: DEBRIDEMENT AND PARTICAL  CLOSURE PERINIAL WOUND;  Surgeon: Leighton Ruff, MD;  Location: [facility];  Service: General;  Laterality: N/A;    Prior to Admission medications   Medication Sig Start Date End Date Taking? Authorizing Provider  aspirin EC 325 MG tablet Take 325 mg by mouth every evening.   Yes [provider]  calcium carbonate (TUMS - DOSED IN MG ELEMENTAL CALCIUM) 500 MG chewable tablet Chew 1 tablet by mouth daily as needed for indigestion or heartburn.   Yes [provider]  colesevelam (WELCHOL) 625 MG tablet Take 1,250 mg by mouth 2 (two) times daily with a meal.   Yes [provider]  fenofibrate (TRICOR) 145 MG tablet Take 145 mg by mouth every evening.   Yes [provider]  hydrochlorothiazide (HYDRODIURIL) 25 MG tablet Take 25 mg by mouth daily.   Yes [provider]  Insulin Degludec (TRESIBA) 100 UNIT/ML SOLN Inject 36 Units into the skin every evening.   Yes [provider]  metFORMIN (GLUCOPHAGE) 500 MG tablet 1 after the largest meal 03/27/16  Yes Hendricks Limes, MD  metoprolol tartrate (LOPRESSOR) 25 MG tablet Take 1 tablet (25 mg total) by mouth 2 (two) times daily. 03/27/16  Yes Hendricks Limes, MD  naproxen sodium (ANAPROX) 220 MG tablet Take 440 mg by mouth daily as needed (pain).   Yes [provider]  ramipril (ALTACE) 10 MG capsule Take 10 mg by mouth every evening.   Yes [provider]  rosuvastatin (CRESTOR) 5 MG tablet Take 5 mg by mouth every evening.   Yes [provider]  TRULICITY  1.5 MG/0.5ML SOPN Inject 1.5 mg into the skin once a week.  08/15/19  Yes [provider]  senna-docusate (SENOKOT-S) 8.6-50 MG tablet Take 1 tablet by mouth at bedtime as needed for mild constipation. Patient not taking: Reported on 01/31/2020 03/25/16   Dhungel, Flonnie Overman, MD  sulfamethoxazole-trimethoprim (BACTRIM DS,SEPTRA DS) 800-160 MG tablet Take 1 tablet by mouth every 12 (twelve) hours. Patient not taking:  Reported on 01/31/2020 03/25/16   Dhungel, Flonnie Overman, MD    Scheduled Meds:  sodium chloride   Intravenous Once   Continuous Infusions: PRN Meds:.  Allergies as of 01/31/2020 - Review Complete 01/31/2020  Allergen Reaction Noted   Codeine  03/14/2016   Flexeril [cyclobenzaprine]  03/14/2016   Tape Other (See Comments) 03/18/2016    Family History  Problem Relation Age of Onset   Skin cancer Mother    Dementia Father    Breast cancer Maternal Aunt        d. 80   Cancer Maternal Uncle        unknown   Cancer Paternal Aunt        unknown   Breast cancer Maternal Grandmother 86   Breast cancer Maternal Aunt 75   Colon cancer Cousin 61   Breast cancer Cousin 68   Colon cancer Cousin 41   Cancer Cousin        unknown   Brain cancer Cousin 65   Cancer Cousin        unknown   Colon cancer Daughter 17       Lynch syndrome - PMS2    Social History   Socioeconomic History   Marital status: Married    Spouse name: Not on file   Number of children: Not on file   Years of education: Not on file   Highest education level: Not on file  Occupational History   Not on file  Tobacco Use   Smoking status: Current Some Day Smoker    Packs/day: 2.00   Smokeless tobacco: Never Used  Substance and Sexual Activity   Alcohol use: No   Drug use: Not on file   Sexual activity: Not on file  Other Topics Concern   Not on file  Social History Narrative   Not on file   Social Determinants of Health   Financial Resource Strain:    Difficulty of Paying Living Expenses:   Food Insecurity:    Worried About Charity fundraiser in the Last Year:    Arboriculturist in the Last Year:   Transportation Needs:    Film/video editor (Medical):    Lack of Transportation (Non-Medical):   Physical Activity:    Days of Exercise per Week:    Minutes of Exercise per Session:   Stress:    Feeling of Stress :   Social Connections:    Frequency of  Communication with Friends and Family:    Frequency of Social Gatherings with Friends and Family:    Attends Religious Services:    Active Member of Clubs or Organizations:    Attends Music therapist:    Marital Status:   Intimate Partner Violence:    Fear of Current or Ex-Partner:    Emotionally Abused:    Physically Abused:    Sexually Abused:     Review of Systems: All negative except as stated above in HPI.  Physical Exam: Vital signs: Vitals:   01/31/20 2102 01/31/20 2116  BP:  (!) 151/94  Pulse: 98 97  Resp: 16 19  Temp:  98.3 F (36.8 C)  SpO2: 96% 96%     General:   Lethargic, pale, mild acute distress, obese, diaphoretic Head: normocephalic, atraumatic Eyes: anicteric sclera ENT: oropharynx clear Neck: supple, nontender Lungs:  Clear throughout to auscultation.   No wheezes, crackles, or rhonchi. No acute distress. Heart:  Regular rate and rhythm; no murmurs, clicks, rubs,  or gallops. Abdomen: left-sided tenderness with guarding, soft, nondistended, +BS  Rectal:  Deferred Ext: no edema  GI:  Lab Results: Recent Labs    01/31/20 1734  WBC 15.6*  HGB 11.7*  HCT 38.4*  PLT 310   BMET Recent Labs    01/31/20 1734  NA 138  K 4.0  CL 103  CO2 23  GLUCOSE 230*  BUN 13  CREATININE 1.00  CALCIUM 9.0   LFT Recent Labs    01/31/20 1734  PROT 6.4*  ALBUMIN 3.4*  AST 20  ALT 18  ALKPHOS 26*  BILITOT 0.9   PT/INR No results for input(s): LABPROT, INR in the last 72 hours.   Studies/Results: CT ABDOMEN PELVIS W CONTRAST  Result Date: 01/31/2020 CLINICAL DATA:  Abdominal distension and rectal bleeding after colonoscopy today EXAM: CT ABDOMEN AND PELVIS WITH CONTRAST TECHNIQUE: Multidetector CT imaging of the abdomen and pelvis was performed using the standard protocol following bolus administration of intravenous contrast. CONTRAST:  179m OMNIPAQUE IOHEXOL 300 MG/ML  SOLN COMPARISON:  01/31/2020 FINDINGS: Lower chest: No  acute pleural or parenchymal lung disease. Hepatobiliary: No focal liver abnormality is seen. No gallstones, gallbladder wall thickening, or biliary dilatation. Pancreas: Unremarkable. No pancreatic ductal dilatation or surrounding inflammatory changes. Spleen: Normal in size without focal abnormality. Adrenals/Urinary Tract: Adrenal glands are unremarkable. Kidneys are normal, without renal calculi, focal lesion, or hydronephrosis. Bladder is unremarkable. Stomach/Bowel: No bowel obstruction or ileus. Normal appendix right lower quadrant. No bowel wall thickening or inflammatory change. Vascular/Lymphatic: Aortic atherosclerosis. No enlarged abdominal or pelvic lymph nodes. Reproductive: Prostate is unremarkable. Other: No free fluid or free intra-abdominal gas. No abdominal wall hernia. Musculoskeletal: No acute or destructive bony lesion. Reconstructed images demonstrate no additional findings. IMPRESSION: 1. No evidence of complication after colonoscopy. No bowel perforation. 2. No acute intra-abdominal or intrapelvic process. 3.  Aortic Atherosclerosis (ICD10-I70.0). Electronically Signed   By: MRanda NgoM.D.   On: 01/31/2020 19:26   DG Chest Port 1 View  Result Date: 01/31/2020 CLINICAL DATA:  Abdominal pain and rectal bleeding after colonoscopy EXAM: PORTABLE CHEST 1 VIEW COMPARISON:  03/16/2016 FINDINGS: 2 frontal views of the chest demonstrate an unremarkable cardiac silhouette. No airspace disease, effusion, or pneumothorax. No evidence of free gas in the greater peritoneal sac. IMPRESSION: 1. No acute intrathoracic process. Electronically Signed   By: MRanda NgoM.D.   On: 01/31/2020 19:43   DG Abd Portable 1 View  Result Date: 01/31/2020 CLINICAL DATA:  Abdominal pain and rectal bleeding status post colonoscopy. EXAM: PORTABLE ABDOMEN - 1 VIEW COMPARISON:  None. FINDINGS: The bowel gas pattern is normal. There is no evidence of free air. No radio-opaque calculi or other significant  radiographic abnormality are seen. IMPRESSION: Negative. Electronically Signed   By: TVirgina NorfolkM.D.   On: 01/31/2020 18:48    Impression/Plan: Post-polypectomy bleed in need of a repeat unprepped colonoscopy to identify the source and if stool preventing identifying of source then will need additional prep and repeat colonoscopy in next 1-2 days. S/P 1 U PRBCs and recommend an additional unit of blood. D/W  Hal Hope.    LOS: 0 days   [name]  01/31/2020, 9:34 PM  Questions please call [phone]

## 2020-01-31 NOTE — Consult Note (Signed)
Referring Provider: Dr. Darl Householder Primary Care Physician:  Lennie Odor, PA Primary Gastroenterologist:  Dr. Watt Climes  Reason for Consultation:  GI bleed; Post-polypectomy bleed  HPI: Jonathan Tucker is a 69 y.o. male who is s/p a colonoscopy this morning where he had 7 small semi-sessile polyps removed from the rectum, descending colon, and transverse colon and one medium-sized semi-sessile polyp removed from the cecum and all were removed with snare cautery. Diffuse diverticulosis was noted along with internal hemorrhoids. Fair prep on colonoscopy. Following the procedure he went home and ate some scrambled eggs and then had profuse amount of bright red blood per rectum with clots that occurred multiple times. Had lower abdominal pain as well and felt lightheaded. Initially in the ER his BP was in the low 100's. Hgb 11.7. Not on any anticoagulation. Wife at bedside. He is in Trendelenburg position and SBP in 150's. Wife at bedside.  Past Medical History:  Diagnosis Date  . Diabetes mellitus without complication ([place])   . Family history of breast cancer   . Family history of colon cancer   . Skin cancer     Past Surgical History:  Procedure Laterality Date  . INCISION AND DRAINAGE ABSCESS N/A 03/14/2016   Procedure: IRRIGATION AND DRAINAGE ABSCESS OF PERINEAL AND SCROTUM, General Deep and Superficial areas;  Surgeon: Armandina Gemma, MD;  Location: [facility];  Service: General;  Laterality: N/A;  . INCISION AND DRAINAGE OF WOUND N/A 03/16/2016   Procedure: IRRIGATION AND DEBRIDEMENT WOUND PERIANAL AND SCROTAL;  Surgeon: Armandina Gemma, MD;  Location: [facility];  Service: General;  Laterality: N/A;  . IRRIGATION AND DEBRIDEMENT ABSCESS N/A 03/14/2016   Procedure: IRRIGATION AND DEBRIDEMENT OF PERINEAL AND SCROTAL ABSCESS, AND CYSTOSCOPY;  Surgeon: Bjorn Loser, MD;  Location: [facility];  Service: Urology;  Laterality: N/A;  . IRRIGATION AND DEBRIDEMENT ABSCESS N/A 03/18/2016   Procedure: DEBRIDEMENT AND PARTICAL  CLOSURE PERINIAL WOUND;  Surgeon: Leighton Ruff, MD;  Location: [facility];  Service: General;  Laterality: N/A;    Prior to Admission medications   Medication Sig Start Date End Date Taking? Authorizing Provider  aspirin EC 325 MG tablet Take 325 mg by mouth every evening.   Yes [provider]  calcium carbonate (TUMS - DOSED IN MG ELEMENTAL CALCIUM) 500 MG chewable tablet Chew 1 tablet by mouth daily as needed for indigestion or heartburn.   Yes [provider]  colesevelam (WELCHOL) 625 MG tablet Take 1,250 mg by mouth 2 (two) times daily with a meal.   Yes [provider]  fenofibrate (TRICOR) 145 MG tablet Take 145 mg by mouth every evening.   Yes [provider]  hydrochlorothiazide (HYDRODIURIL) 25 MG tablet Take 25 mg by mouth daily.   Yes [provider]  Insulin Degludec (TRESIBA) 100 UNIT/ML SOLN Inject 36 Units into the skin every evening.   Yes [provider]  metFORMIN (GLUCOPHAGE) 500 MG tablet 1 after the largest meal 03/27/16  Yes Hendricks Limes, MD  metoprolol tartrate (LOPRESSOR) 25 MG tablet Take 1 tablet (25 mg total) by mouth 2 (two) times daily. 03/27/16  Yes Hendricks Limes, MD  naproxen sodium (ANAPROX) 220 MG tablet Take 440 mg by mouth daily as needed (pain).   Yes [provider]  ramipril (ALTACE) 10 MG capsule Take 10 mg by mouth every evening.   Yes [provider]  rosuvastatin (CRESTOR) 5 MG tablet Take 5 mg by mouth every evening.   Yes [provider]  TRULICITY  1.5 MG/0.5ML SOPN Inject 1.5 mg into the skin once a week.  08/15/19  Yes [provider]  senna-docusate (SENOKOT-S) 8.6-50 MG tablet Take 1 tablet by mouth at bedtime as needed for mild constipation. Patient not taking: Reported on 01/31/2020 03/25/16   Dhungel, Flonnie Overman, MD  sulfamethoxazole-trimethoprim (BACTRIM DS,SEPTRA DS) 800-160 MG tablet Take 1 tablet by mouth every 12 (twelve) hours. Patient not taking:  Reported on 01/31/2020 03/25/16   Dhungel, Flonnie Overman, MD    Scheduled Meds: . sodium chloride   Intravenous Once   Continuous Infusions: PRN Meds:.  Allergies as of 01/31/2020 - Review Complete 01/31/2020  Allergen Reaction Noted  . Codeine  03/14/2016  . Flexeril [cyclobenzaprine]  03/14/2016  . Tape Other (See Comments) 03/18/2016    Family History  Problem Relation Age of Onset  . Skin cancer Mother   . Dementia Father   . Breast cancer Maternal Aunt        d. 61  . Cancer Maternal Uncle        unknown  . Cancer Paternal Aunt        unknown  . Breast cancer Maternal Grandmother 86  . Breast cancer Maternal Aunt 75  . Colon cancer Cousin 43  . Breast cancer Cousin 57  . Colon cancer Cousin 68  . Cancer Cousin        unknown  . Brain cancer Cousin 39  . Cancer Cousin        unknown  . Colon cancer Daughter 18       Lynch syndrome - PMS2    Social History   Socioeconomic History  . Marital status: Married    Spouse name: Not on file  . Number of children: Not on file  . Years of education: Not on file  . Highest education level: Not on file  Occupational History  . Not on file  Tobacco Use  . Smoking status: Current Some Day Smoker    Packs/day: 2.00  . Smokeless tobacco: Never Used  Substance and Sexual Activity  . Alcohol use: No  . Drug use: Not on file  . Sexual activity: Not on file  Other Topics Concern  . Not on file  Social History Narrative  . Not on file   Social Determinants of Health   Financial Resource Strain:   . Difficulty of Paying Living Expenses:   Food Insecurity:   . Worried About Charity fundraiser in the Last Year:   . Arboriculturist in the Last Year:   Transportation Needs:   . Film/video editor (Medical):   Marland Kitchen Lack of Transportation (Non-Medical):   Physical Activity:   . Days of Exercise per Week:   . Minutes of Exercise per Session:   Stress:   . Feeling of Stress :   Social Connections:   . Frequency of  Communication with Friends and Family:   . Frequency of Social Gatherings with Friends and Family:   . Attends Religious Services:   . Active Member of Clubs or Organizations:   . Attends Archivist Meetings:   Marland Kitchen Marital Status:   Intimate Partner Violence:   . Fear of Current or Ex-Partner:   . Emotionally Abused:   Marland Kitchen Physically Abused:   . Sexually Abused:     Review of Systems: All negative except as stated above in HPI.  Physical Exam: Vital signs: Vitals:   01/31/20 2102 01/31/20 2116  BP:  (!) 151/94  Pulse: 98 97  Resp: 16 19  Temp:  98.3 F (36.8 C)  SpO2: 96% 96%     General:   Lethargic, pale, mild acute distress, obese, diaphoretic Head: normocephalic, atraumatic Eyes: anicteric sclera ENT: oropharynx clear Neck: supple, nontender Lungs:  Clear throughout to auscultation.   No wheezes, crackles, or rhonchi. No acute distress. Heart:  Regular rate and rhythm; no murmurs, clicks, rubs,  or gallops. Abdomen: left-sided tenderness with guarding, soft, nondistended, +BS  Rectal:  Deferred Ext: no edema  GI:  Lab Results: Recent Labs    01/31/20 1734  WBC 15.6*  HGB 11.7*  HCT 38.4*  PLT 310   BMET Recent Labs    01/31/20 1734  NA 138  K 4.0  CL 103  CO2 23  GLUCOSE 230*  BUN 13  CREATININE 1.00  CALCIUM 9.0   LFT Recent Labs    01/31/20 1734  PROT 6.4*  ALBUMIN 3.4*  AST 20  ALT 18  ALKPHOS 26*  BILITOT 0.9   PT/INR No results for input(s): LABPROT, INR in the last 72 hours.   Studies/Results: CT ABDOMEN PELVIS W CONTRAST  Result Date: 01/31/2020 CLINICAL DATA:  Abdominal distension and rectal bleeding after colonoscopy today EXAM: CT ABDOMEN AND PELVIS WITH CONTRAST TECHNIQUE: Multidetector CT imaging of the abdomen and pelvis was performed using the standard protocol following bolus administration of intravenous contrast. CONTRAST:  198m OMNIPAQUE IOHEXOL 300 MG/ML  SOLN COMPARISON:  01/31/2020 FINDINGS: Lower chest: No  acute pleural or parenchymal lung disease. Hepatobiliary: No focal liver abnormality is seen. No gallstones, gallbladder wall thickening, or biliary dilatation. Pancreas: Unremarkable. No pancreatic ductal dilatation or surrounding inflammatory changes. Spleen: Normal in size without focal abnormality. Adrenals/Urinary Tract: Adrenal glands are unremarkable. Kidneys are normal, without renal calculi, focal lesion, or hydronephrosis. Bladder is unremarkable. Stomach/Bowel: No bowel obstruction or ileus. Normal appendix right lower quadrant. No bowel wall thickening or inflammatory change. Vascular/Lymphatic: Aortic atherosclerosis. No enlarged abdominal or pelvic lymph nodes. Reproductive: Prostate is unremarkable. Other: No free fluid or free intra-abdominal gas. No abdominal wall hernia. Musculoskeletal: No acute or destructive bony lesion. Reconstructed images demonstrate no additional findings. IMPRESSION: 1. No evidence of complication after colonoscopy. No bowel perforation. 2. No acute intra-abdominal or intrapelvic process. 3.  Aortic Atherosclerosis (ICD10-I70.0). Electronically Signed   By: MRanda NgoM.D.   On: 01/31/2020 19:26   DG Chest Port 1 View  Result Date: 01/31/2020 CLINICAL DATA:  Abdominal pain and rectal bleeding after colonoscopy EXAM: PORTABLE CHEST 1 VIEW COMPARISON:  03/16/2016 FINDINGS: 2 frontal views of the chest demonstrate an unremarkable cardiac silhouette. No airspace disease, effusion, or pneumothorax. No evidence of free gas in the greater peritoneal sac. IMPRESSION: 1. No acute intrathoracic process. Electronically Signed   By: MRanda NgoM.D.   On: 01/31/2020 19:43   DG Abd Portable 1 View  Result Date: 01/31/2020 CLINICAL DATA:  Abdominal pain and rectal bleeding status post colonoscopy. EXAM: PORTABLE ABDOMEN - 1 VIEW COMPARISON:  None. FINDINGS: The bowel gas pattern is normal. There is no evidence of free air. No radio-opaque calculi or other significant  radiographic abnormality are seen. IMPRESSION: Negative. Electronically Signed   By: TVirgina NorfolkM.D.   On: 01/31/2020 18:48    Impression/Plan: Post-polypectomy bleed in need of a repeat unprepped colonoscopy to identify the source and if stool preventing identifying of source then will need additional prep and repeat colonoscopy in next 1-2 days. S/P 1 U PRBCs and recommend an additional unit of blood. D/W  Hal Hope.    LOS: 0 days   [name]  01/31/2020, 9:34 PM  Questions please call [phone]

## 2020-01-31 NOTE — ED Notes (Signed)
Pt transported to CT ?

## 2020-01-31 NOTE — ED Notes (Signed)
Reassessment after rectal TXA application. RN did not note any ongoing rectal bleeding. Replaced chuck pads will continue to monitor.

## 2020-01-31 NOTE — Interval H&P Note (Signed)
History and Physical Interval Note:  01/31/2020 11:13 PM  Jonathan Tucker  has presented today for surgery, with the diagnosis of GI Bleed; Post-Polypectomy Bleed.  The various methods of treatment have been discussed with the patient and family. After consideration of risks, benefits and other options for treatment, the patient has consented to  Procedure(s): COLONOSCOPY WITH PROPOFOL (N/A) as a surgical intervention.  The patient's history has been reviewed, patient examined, no change in status, stable for surgery.  I have reviewed the patient's chart and labs.  Questions were answered to the patient's satisfaction.     [name]

## 2020-01-31 NOTE — ED Notes (Signed)
Bright reb blood coming from rectum. Dr Darl Householder aware. Pending orders.

## 2020-01-31 NOTE — ED Notes (Signed)
Noted about 6 large clots from pt's rectum. Informed Dr. Darl Householder. Pending new orders. CT scan and 1 unit blood transfusion

## 2020-01-31 NOTE — Anesthesia Preprocedure Evaluation (Addendum)
Anesthesia Evaluation  Patient identified by MRN, date of birth, ID band Patient awake    Reviewed: Allergy & Precautions, NPO status , Patient's Chart, lab work & pertinent test results, reviewed documented beta blocker date and time   History of Anesthesia Complications Negative for: history of anesthetic complications  Airway Mallampati: II  TM Distance: >3 FB Neck ROM: Full    Dental  (+) Edentulous Upper, Missing, Dental Advisory Given, Poor Dentition   Pulmonary COPD,  COPD inhaler, Current Smoker and Patient abstained from smoking.,  01/31/2020 SARS coronavirus NEG   breath sounds clear to auscultation       Cardiovascular hypertension, Pt. on medications and Pt. on home beta blockers (-) angina+ dysrhythmias Atrial Fibrillation  Rhythm:Regular Rate:Normal  '17 ECHO: EF 55-60%, valves OK   Neuro/Psych negative neurological ROS     GI/Hepatic Neg liver ROS, Bright red blood per rectum s/p colonoscopy with polypectomy   Endo/Other  diabetes (glu 109), Oral Hypoglycemic Agents, Insulin Dependent  Renal/GU negative Renal ROS   H/o Fournier's gangrene    Musculoskeletal   Abdominal   Peds  Hematology Hb 11.7, and now has received 1u PRBC   Anesthesia Other Findings   Reproductive/Obstetrics                            Anesthesia Physical Anesthesia Plan  ASA: III and emergent  Anesthesia Plan: MAC   Post-op Pain Management:    Induction: Intravenous  PONV Risk Score and Plan: 0 and Treatment may vary due to age or medical condition  Airway Management Planned: Natural Airway and Simple Face Mask  Additional Equipment:   Intra-op Plan:   Post-operative Plan:   Informed Consent: I have reviewed the patients History and Physical, chart, labs and discussed the procedure including the risks, benefits and alternatives for the proposed anesthesia with the patient or authorized  representative who has indicated his/her understanding and acceptance.     Dental advisory given  Plan Discussed with: CRNA and Surgeon  Anesthesia Plan Comments:        Anesthesia Quick Evaluation

## 2020-02-01 ENCOUNTER — Inpatient Hospital Stay (HOSPITAL_COMMUNITY): Payer: Medicare Other

## 2020-02-01 DIAGNOSIS — Z7982 Long term (current) use of aspirin: Secondary | ICD-10-CM | POA: Diagnosis not present

## 2020-02-01 DIAGNOSIS — K5731 Diverticulosis of large intestine without perforation or abscess with bleeding: Secondary | ICD-10-CM | POA: Diagnosis not present

## 2020-02-01 DIAGNOSIS — K644 Residual hemorrhoidal skin tags: Secondary | ICD-10-CM | POA: Diagnosis not present

## 2020-02-01 DIAGNOSIS — K922 Gastrointestinal hemorrhage, unspecified: Secondary | ICD-10-CM | POA: Diagnosis not present

## 2020-02-01 DIAGNOSIS — Z808 Family history of malignant neoplasm of other organs or systems: Secondary | ICD-10-CM | POA: Diagnosis not present

## 2020-02-01 DIAGNOSIS — D62 Acute posthemorrhagic anemia: Secondary | ICD-10-CM | POA: Diagnosis not present

## 2020-02-01 DIAGNOSIS — D128 Benign neoplasm of rectum: Secondary | ICD-10-CM | POA: Diagnosis not present

## 2020-02-01 DIAGNOSIS — Z20822 Contact with and (suspected) exposure to covid-19: Secondary | ICD-10-CM | POA: Diagnosis not present

## 2020-02-01 DIAGNOSIS — J449 Chronic obstructive pulmonary disease, unspecified: Secondary | ICD-10-CM | POA: Diagnosis not present

## 2020-02-01 DIAGNOSIS — J431 Panlobular emphysema: Secondary | ICD-10-CM | POA: Diagnosis not present

## 2020-02-01 DIAGNOSIS — Z888 Allergy status to other drugs, medicaments and biological substances status: Secondary | ICD-10-CM | POA: Diagnosis not present

## 2020-02-01 DIAGNOSIS — I10 Essential (primary) hypertension: Secondary | ICD-10-CM | POA: Diagnosis not present

## 2020-02-01 DIAGNOSIS — Z9889 Other specified postprocedural states: Secondary | ICD-10-CM | POA: Diagnosis not present

## 2020-02-01 DIAGNOSIS — K648 Other hemorrhoids: Secondary | ICD-10-CM | POA: Diagnosis present

## 2020-02-01 DIAGNOSIS — K625 Hemorrhage of anus and rectum: Secondary | ICD-10-CM | POA: Diagnosis not present

## 2020-02-01 DIAGNOSIS — Z7984 Long term (current) use of oral hypoglycemic drugs: Secondary | ICD-10-CM | POA: Diagnosis not present

## 2020-02-01 DIAGNOSIS — Z79899 Other long term (current) drug therapy: Secondary | ICD-10-CM | POA: Diagnosis not present

## 2020-02-01 DIAGNOSIS — Z803 Family history of malignant neoplasm of breast: Secondary | ICD-10-CM | POA: Diagnosis not present

## 2020-02-01 DIAGNOSIS — D120 Benign neoplasm of cecum: Secondary | ICD-10-CM | POA: Diagnosis not present

## 2020-02-01 DIAGNOSIS — K91840 Postprocedural hemorrhage and hematoma of a digestive system organ or structure following a digestive system procedure: Secondary | ICD-10-CM | POA: Diagnosis not present

## 2020-02-01 DIAGNOSIS — Z85828 Personal history of other malignant neoplasm of skin: Secondary | ICD-10-CM | POA: Diagnosis not present

## 2020-02-01 DIAGNOSIS — Z885 Allergy status to narcotic agent status: Secondary | ICD-10-CM | POA: Diagnosis not present

## 2020-02-01 DIAGNOSIS — Y838 Other surgical procedures as the cause of abnormal reaction of the patient, or of later complication, without mention of misadventure at the time of the procedure: Secondary | ICD-10-CM | POA: Diagnosis present

## 2020-02-01 DIAGNOSIS — I480 Paroxysmal atrial fibrillation: Secondary | ICD-10-CM | POA: Diagnosis present

## 2020-02-01 DIAGNOSIS — K635 Polyp of colon: Secondary | ICD-10-CM | POA: Diagnosis not present

## 2020-02-01 DIAGNOSIS — K5521 Angiodysplasia of colon with hemorrhage: Secondary | ICD-10-CM | POA: Diagnosis not present

## 2020-02-01 DIAGNOSIS — R578 Other shock: Secondary | ICD-10-CM | POA: Diagnosis not present

## 2020-02-01 DIAGNOSIS — Z800 Family history of malignant neoplasm of digestive organs: Secondary | ICD-10-CM | POA: Diagnosis not present

## 2020-02-01 DIAGNOSIS — F17210 Nicotine dependence, cigarettes, uncomplicated: Secondary | ICD-10-CM | POA: Diagnosis not present

## 2020-02-01 DIAGNOSIS — J430 Unilateral pulmonary emphysema [MacLeod's syndrome]: Secondary | ICD-10-CM | POA: Diagnosis not present

## 2020-02-01 DIAGNOSIS — K633 Ulcer of intestine: Secondary | ICD-10-CM | POA: Diagnosis not present

## 2020-02-01 DIAGNOSIS — E1151 Type 2 diabetes mellitus with diabetic peripheral angiopathy without gangrene: Secondary | ICD-10-CM | POA: Diagnosis not present

## 2020-02-01 DIAGNOSIS — K5730 Diverticulosis of large intestine without perforation or abscess without bleeding: Secondary | ICD-10-CM | POA: Diagnosis not present

## 2020-02-01 DIAGNOSIS — R109 Unspecified abdominal pain: Secondary | ICD-10-CM | POA: Diagnosis not present

## 2020-02-01 DIAGNOSIS — K254 Chronic or unspecified gastric ulcer with hemorrhage: Secondary | ICD-10-CM | POA: Diagnosis not present

## 2020-02-01 DIAGNOSIS — K921 Melena: Secondary | ICD-10-CM | POA: Diagnosis not present

## 2020-02-01 DIAGNOSIS — D123 Benign neoplasm of transverse colon: Secondary | ICD-10-CM | POA: Diagnosis not present

## 2020-02-01 LAB — CBC WITH DIFFERENTIAL/PLATELET
Abs Immature Granulocytes: 0.07 10*3/uL (ref 0.00–0.07)
Basophils Absolute: 0 10*3/uL (ref 0.0–0.1)
Basophils Relative: 0 %
Eosinophils Absolute: 0 10*3/uL (ref 0.0–0.5)
Eosinophils Relative: 0 %
HCT: 32.3 % — ABNORMAL LOW (ref 39.0–52.0)
Hemoglobin: 10.8 g/dL — ABNORMAL LOW (ref 13.0–17.0)
Immature Granulocytes: 1 %
Lymphocytes Relative: 10 %
Lymphs Abs: 1 10*3/uL (ref 0.7–4.0)
MCH: 30 pg (ref 26.0–34.0)
MCHC: 33.4 g/dL (ref 30.0–36.0)
MCV: 89.7 fL (ref 80.0–100.0)
Monocytes Absolute: 0.7 10*3/uL (ref 0.1–1.0)
Monocytes Relative: 8 %
Neutro Abs: 7.9 10*3/uL — ABNORMAL HIGH (ref 1.7–7.7)
Neutrophils Relative %: 81 %
Platelets: 155 10*3/uL (ref 150–400)
RBC: 3.6 MIL/uL — ABNORMAL LOW (ref 4.22–5.81)
RDW: 14.3 % (ref 11.5–15.5)
WBC: 9.7 10*3/uL (ref 4.0–10.5)
nRBC: 0 % (ref 0.0–0.2)

## 2020-02-01 LAB — CBC
HCT: 28.6 % — ABNORMAL LOW (ref 39.0–52.0)
Hemoglobin: 8.9 g/dL — ABNORMAL LOW (ref 13.0–17.0)
MCH: 28.5 pg (ref 26.0–34.0)
MCHC: 31.1 g/dL (ref 30.0–36.0)
MCV: 91.7 fL (ref 80.0–100.0)
Platelets: 186 10*3/uL (ref 150–400)
RBC: 3.12 MIL/uL — ABNORMAL LOW (ref 4.22–5.81)
RDW: 14.6 % (ref 11.5–15.5)
WBC: 7.4 10*3/uL (ref 4.0–10.5)
nRBC: 0 % (ref 0.0–0.2)

## 2020-02-01 LAB — GLUCOSE, CAPILLARY
Glucose-Capillary: 108 mg/dL — ABNORMAL HIGH (ref 70–99)
Glucose-Capillary: 118 mg/dL — ABNORMAL HIGH (ref 70–99)
Glucose-Capillary: 136 mg/dL — ABNORMAL HIGH (ref 70–99)
Glucose-Capillary: 138 mg/dL — ABNORMAL HIGH (ref 70–99)
Glucose-Capillary: 143 mg/dL — ABNORMAL HIGH (ref 70–99)

## 2020-02-01 LAB — BASIC METABOLIC PANEL
Anion gap: 10 (ref 5–15)
BUN: 11 mg/dL (ref 8–23)
CO2: 23 mmol/L (ref 22–32)
Calcium: 8.2 mg/dL — ABNORMAL LOW (ref 8.9–10.3)
Chloride: 104 mmol/L (ref 98–111)
Creatinine, Ser: 0.89 mg/dL (ref 0.61–1.24)
GFR calc Af Amer: >60 mL/min (ref >60)
GFR calc non Af Amer: >60 mL/min (ref >60)
Glucose, Bld: 127 mg/dL — ABNORMAL HIGH (ref 70–99)
Potassium: 4.2 mmol/L (ref 3.5–5.1)
Sodium: 137 mmol/L (ref 135–145)

## 2020-02-01 LAB — HIV ANTIBODY (ROUTINE TESTING W REFLEX): HIV Screen 4th Generation wRfx: NONREACTIVE

## 2020-02-01 MED ORDER — IOHEXOL 350 MG/ML SOLN
100.0000 mL | Freq: Once | INTRAVENOUS | Status: AC | PRN
  Administered 2020-02-01: 100 mL via INTRAVENOUS

## 2020-02-01 MED ORDER — SODIUM CHLORIDE (PF) 0.9 % IJ SOLN
PREFILLED_SYRINGE | INTRAMUSCULAR | Status: DC | PRN
  Administered 2020-02-01: 8 mL

## 2020-02-01 MED ORDER — SPOT INK MARKER SYRINGE KIT
PACK | SUBMUCOSAL | Status: AC
  Filled 2020-02-01: qty 5

## 2020-02-01 MED ORDER — EPINEPHRINE 1 MG/10ML IJ SOSY
PREFILLED_SYRINGE | INTRAMUSCULAR | Status: AC
  Filled 2020-02-01: qty 10

## 2020-02-01 MED ORDER — TECHNETIUM TC 99M-LABELED RED BLOOD CELLS IV KIT
26.5000 | PACK | Freq: Once | INTRAVENOUS | Status: AC | PRN
  Administered 2020-02-01: 26.5 via INTRAVENOUS

## 2020-02-01 MED ORDER — PANTOPRAZOLE SODIUM 40 MG IV SOLR
40.0000 mg | Freq: Two times a day (BID) | INTRAVENOUS | Status: DC
  Administered 2020-02-02 – 2020-02-03 (×3): 40 mg via INTRAVENOUS
  Filled 2020-02-01 (×3): qty 40

## 2020-02-01 MED ORDER — SODIUM CHLORIDE 0.9 % IV SOLN: INTRAVENOUS | Status: DC

## 2020-02-01 MED ORDER — LIDOCAINE HCL 1 % IJ SOLN
INTRAMUSCULAR | Status: AC
  Filled 2020-02-01: qty 20

## 2020-02-01 MED ORDER — LACTATED RINGERS IV BOLUS
500.0000 mL | Freq: Once | INTRAVENOUS | Status: AC
  Administered 2020-02-01: 500 mL via INTRAVENOUS

## 2020-02-01 NOTE — Transfer of Care (Signed)
Immediate Anesthesia Transfer of Care Note  Patient: Jonathan Tucker  Procedure(s) Performed: COLONOSCOPY WITH PROPOFOL (N/A ) HEMOSTASIS CLIP PLACEMENT SCLEROTHERAPY  Patient Location: PACU  Anesthesia Type:MAC  Level of Consciousness: awake, alert , oriented and patient cooperative  Airway & Oxygen Therapy: Patient Spontanous Breathing  Post-op Assessment: Report given to RN, Post -op Vital signs reviewed and stable and Patient moving all extremities X 4  Post vital signs: Reviewed and stable  Last Vitals:  Vitals Value Taken Time  BP 136/73 02/01/20 0037  Temp    Pulse 94 02/01/20 0038  Resp 22 02/01/20 0038  SpO2 96 % 02/01/20 0038  Vitals shown include unvalidated device data.  Last Pain:  Vitals:   01/31/20 2314  TempSrc: Oral  PainSc:          Complications: No complications documented.

## 2020-02-01 NOTE — Brief Op Note (Signed)
Large amount of red blood and clots mainly in left colon but unable to pinpoint site. Large polypectomy ulcer in cecum with protuberance but not actively bleeding and it did not bleed with frequent irrigation. Suspect bleeding site is one of the polypectomy sites in the left colon. Miralax prep in morning to flush out residual blood and may need another colonoscopy if signs of ongoing bleeding present. If patient becomes hemodynamically unstable then needs an angiogram by IR with possible embolization. Aggressive volume resuscitation. Keep NPO.

## 2020-02-01 NOTE — Progress Notes (Signed)
PROGRESS NOTE    Jonathan Tucker  ENI:[id number] DOB: [birth year] DOA: 01/31/2020 PCP: Lennie Odor, PA    Chief Complaint  Patient presents with  . Abdominal Pain    Brief Narrative:  Jonathan Tucker is a 69 y.o. male with history of A. fib, diabetes mellitus, hypertension, hyperlipidemia was brought to the ER after patient had multiple episodes of frank rectal bleeding.  Patient had a colonoscopy done this morning and had 8 polyps removed.  At around 2 PM patient started the pain frank rectal bleeding with abdominal discomfort.  Patient presents to the ER.  While waiting in the ER patient had a syncopal episode did not strike his head. Hemoglobin is around 11.7.  The last one in our system was around 11.4 in 2017.  Dr. Michail Sermon on-call gastroenterologist was consulted.  CT abdomen pelvis did not show anything acute.  Covid test was negative.  Plan is to take patient to urgent colonoscopy.  EKG shows sinus tachycardia.  2 units of PRBC transfusion has been ordered.repeat hemoglobin around 10.   Assessment & Plan:   Principal Problem:   Acute GI bleeding Active Problems:   Uncontrolled diabetes mellitus (HCC)   COPD (chronic obstructive pulmonary disease) (HCC)   Essential hypertension   PAF (paroxysmal atrial fibrillation) (HCC)   Acute blood loss anemia   Acute blood loss anemia:  Probably from rectal bleeding.  Colonoscopy shows an ulcer in cecum and visualized colon has blood. S/p 2 units of prbc transfusion  miralax prep ordered by GI and pt may need another colonoscopy.  RN reports that pt has 2 bowel movements with blood in it.  NM GI blood loss ordered. Will probably need embolization.  IR will be consulted.    Hypotension resolved.  BP parameters are optimal    PAF: Rate controlled.  Not on anti coagulation.  Takes aspirin.  Holding BB for hypotension.    DM: CBG (last 3)  Recent Labs    02/01/20 0839 02/01/20 1111 02/01/20 1641  GLUCAP 118* 143* 138*    Continue with SSI.    COPD: No wheezing heard.      DVT prophylaxis: scd's Code Status:  Full code.  Family Communication: family at bedside.  Disposition:   Status is: Observation  The patient will require care spanning > 2 midnights and should be moved to inpatient because: Ongoing diagnostic testing needed not appropriate for outpatient work up  Dispo: The patient is from: Home              Anticipated d/c is to: pending.               Anticipated d/c date is: 1 day              Patient currently is not medically stable to d/c.       Consultants:   Gastroenterology Dr Penelope Coop.   Procedures:Colonoscopy this am.    Antimicrobials: none.    Subjective: No chest pain or sob.   Objective: Vitals:   02/01/20 0127 02/01/20 0539 02/01/20 0744 02/01/20 1108  BP: 125/75 (!) 103/53 126/70 121/73  Pulse: 95 86 87 82  Resp: 18 16 20 16   Temp: 98.8 F (37.1 C) 98.7 F (37.1 C) 98.4 F (36.9 C) 97.9 F (36.6 C)  TempSrc: Oral Oral Oral Oral  SpO2: 96% 96% 97% 100%  Weight: 109.9 kg     Height: 5\' 11"  (1.803 m)       Intake/Output Summary (Last 24 hours)  at 02/01/2020 1646 Last data filed at 02/01/2020 1600 Gross per 24 hour  Intake 3493.99 ml  Output 2575 ml  Net 918.99 ml   Filed Weights   01/31/20 1724 02/01/20 0127  Weight: 86.2 kg 109.9 kg    Examination:  General exam: Appears calm and comfortable  Respiratory system: Clear to auscultation. Respiratory effort normal. Cardiovascular system: S1 & S2 heard, RRR.  No pedal edema. Gastrointestinal system: Abdomen is nondistended, soft and nontender. Normal bowel sounds heard. Central nervous system: Alert and oriented. No focal neurological deficits. Extremities: Symmetric 5 x 5 power. Skin: No rashes, lesions or ulcers Psychiatry: Mood & affect appropriate.     Data Reviewed: I have personally reviewed following labs and imaging studies  CBC: Recent Labs  Lab 01/31/20 1734 02/01/20 0219   WBC 15.6* 9.7  NEUTROABS  --  7.9*  HGB 11.7* 10.8*  HCT 38.4* 32.3*  MCV 95.5 89.7  PLT 310 774    Basic Metabolic Panel: Recent Labs  Lab 01/31/20 1734 02/01/20 0219  NA 138 137  K 4.0 4.2  CL 103 104  CO2 23 23  GLUCOSE 230* 127*  BUN 13 11  CREATININE 1.00 0.89  CALCIUM 9.0 8.2*    GFR: Estimated Creatinine Clearance: 97.3 mL/min (by C-G formula based on SCr of 0.89 mg/dL).  Liver Function Tests: Recent Labs  Lab 01/31/20 1734  AST 20  ALT 18  ALKPHOS 26*  BILITOT 0.9  PROT 6.4*  ALBUMIN 3.4*    CBG: Recent Labs  Lab 02/01/20 0038 02/01/20 0622 02/01/20 0839 02/01/20 1111 02/01/20 1641  GLUCAP 136* 108* 118* 143* 138*     Recent Results (from the past 240 hour(s))  SARS Coronavirus 2 by RT PCR (hospital order, performed in [facility] hospital lab) Nasopharyngeal Nasopharyngeal Swab     Status: None   Collection Time: 01/31/20  6:34 PM   Specimen: Nasopharyngeal Swab  Result Value Ref Range Status   SARS Coronavirus 2 NEGATIVE NEGATIVE Final    Comment: (NOTE) SARS-CoV-2 target nucleic acids are NOT DETECTED.  The SARS-CoV-2 RNA is generally detectable in upper and lower respiratory specimens during the acute phase of infection. The lowest concentration of SARS-CoV-2 viral copies this assay can detect is 250 copies / mL. A negative result does not preclude SARS-CoV-2 infection and should not be used as the sole basis for treatment or other patient management decisions.  A negative result may occur with improper specimen collection / handling, submission of specimen other than nasopharyngeal swab, presence of viral mutation(s) within the areas targeted by this assay, and inadequate number of viral copies (<250 copies / mL). A negative result must be combined with clinical observations, patient history, and epidemiological information.  Fact Sheet for Patients:   StrictlyIdeas.no  Fact Sheet for Healthcare  Providers: BankingDealers.co.za  This test is not yet approved or  cleared by the [place] FDA and has been authorized for detection and/or diagnosis of SARS-CoV-2 by FDA under an Emergency Use Authorization (EUA).  This EUA will remain in effect (meaning this test can be used) for the duration of the COVID-19 declaration under Section 564(b)(1) of the Act, 21 U.S.C. section 360bbb-3(b)(1), unless the authorization is terminated or revoked sooner.  Performed at [facility], [place] [address], [place], [place] [address]          Radiology Studies: CT ABDOMEN PELVIS W CONTRAST  Result Date: 01/31/2020 CLINICAL DATA:  Abdominal distension and rectal bleeding after colonoscopy today EXAM:  CT ABDOMEN AND PELVIS WITH CONTRAST TECHNIQUE: Multidetector CT imaging of the abdomen and pelvis was performed using the standard protocol following bolus administration of intravenous contrast. CONTRAST:  157mL OMNIPAQUE IOHEXOL 300 MG/ML  SOLN COMPARISON:  01/31/2020 FINDINGS: Lower chest: No acute pleural or parenchymal lung disease. Hepatobiliary: No focal liver abnormality is seen. No gallstones, gallbladder wall thickening, or biliary dilatation. Pancreas: Unremarkable. No pancreatic ductal dilatation or surrounding inflammatory changes. Spleen: Normal in size without focal abnormality. Adrenals/Urinary Tract: Adrenal glands are unremarkable. Kidneys are normal, without renal calculi, focal lesion, or hydronephrosis. Bladder is unremarkable. Stomach/Bowel: No bowel obstruction or ileus. Normal appendix right lower quadrant. No bowel wall thickening or inflammatory change. Vascular/Lymphatic: Aortic atherosclerosis. No enlarged abdominal or pelvic lymph nodes. Reproductive: Prostate is unremarkable. Other: No free fluid or free intra-abdominal gas. No abdominal wall hernia. Musculoskeletal: No acute or destructive bony lesion. Reconstructed images demonstrate no additional  findings. IMPRESSION: 1. No evidence of complication after colonoscopy. No bowel perforation. 2. No acute intra-abdominal or intrapelvic process. 3.  Aortic Atherosclerosis (ICD10-I70.0). Electronically Signed   By: Randa Ngo M.D.   On: 01/31/2020 19:26   DG Chest Port 1 View  Result Date: 01/31/2020 CLINICAL DATA:  Abdominal pain and rectal bleeding after colonoscopy EXAM: PORTABLE CHEST 1 VIEW COMPARISON:  03/16/2016 FINDINGS: 2 frontal views of the chest demonstrate an unremarkable cardiac silhouette. No airspace disease, effusion, or pneumothorax. No evidence of free gas in the greater peritoneal sac. IMPRESSION: 1. No acute intrathoracic process. Electronically Signed   By: Randa Ngo M.D.   On: 01/31/2020 19:43   DG Abd Portable 1 View  Result Date: 01/31/2020 CLINICAL DATA:  Abdominal pain and rectal bleeding status post colonoscopy. EXAM: PORTABLE ABDOMEN - 1 VIEW COMPARISON:  None. FINDINGS: The bowel gas pattern is normal. There is no evidence of free air. No radio-opaque calculi or other significant radiographic abnormality are seen. IMPRESSION: Negative. Electronically Signed   By: Virgina Norfolk M.D.   On: 01/31/2020 18:48        Scheduled Meds: . insulin aspart  0-6 Units Subcutaneous Q4H   Continuous Infusions: . sodium chloride 20 mL/hr at 02/01/20 0615  . sodium chloride       LOS: 0 days       Hosie Poisson, MD [facility]   To contact the attending provider between 7A-7P or the covering provider during after hours 7P-7A, please log into the web site [email or web address] and access using universal [place] password for that web site. If you do not have the password, please call the hospital operator.  02/01/2020, 4:46 PM

## 2020-02-01 NOTE — Op Note (Addendum)
[facility] Patient Name: Jonathan Tucker Procedure Date : 01/31/2020 MRN: [id number] Attending MD: [name] , MD Date of Birth: 05/20/1951 CSN: [id number] Age: 69 Admit Type: Emergency Department Procedure:                Colonoscopy Indications:              Treatment of bleeding from polypectomy site,                            Hematochezia; Last colonoscopy 01/31/20 AM Providers:                [name], MD, Benay Pillow, RN, Cherylynn Ridges, Technician, Valda Lamb CRNA, CRNA Referring MD:             hospital team Medicines:                Propofol per Anesthesia, Monitored Anesthesia Care Complications:            No immediate complications. Estimated Blood Loss:     Estimated blood loss was minimal. Procedure:                Pre-Anesthesia Assessment:                           - Prior to the procedure, a History and Physical                            was performed, and patient medications and                            allergies were reviewed. The patient's tolerance of                            previous anesthesia was also reviewed. The risks                            and benefits of the procedure and the sedation                            options and risks were discussed with the patient.                            All questions were answered, and informed consent                            was obtained. Prior Anticoagulants: The patient has                            taken no previous anticoagulant or antiplatelet                            agents. ASA Grade Assessment: III - A patient with  severe systemic disease. After reviewing the risks                            and benefits, the patient was deemed in                            satisfactory condition to undergo the procedure.                           After obtaining informed consent, the colonoscope                            was  passed under direct vision. Throughout the                            procedure, the patient's blood pressure, pulse, and                            oxygen saturations were monitored continuously. The                            [id number]-H190DL ([id number] Olympus pediatric colonoscope                            was introduced through the anus and advanced to the                            the cecum, identified by appendiceal orifice and                            ileocecal valve. The colonoscopy was performed with                            difficulty due to excessive bleeding, poor                            endoscopic visualization, significant looping and a                            tortuous colon. Successful completion of the                            procedure was aided by straightening and shortening                            the scope to obtain bowel loop reduction, using                            scope torsion and lavage. The patient tolerated the                            procedure. The quality of the bowel preparation was  an unprepped procedure. The ileocecal valve,                            appendiceal orifice, and rectum were photographed. Scope In: 11:31:57 PM Scope Out: 12:24:16 AM Total Procedure Duration: 0 hours 52 minutes 19 seconds  Findings:      The perianal exam findings include catheter in rectum with clot noted;       external hemorrhoids.      Red blood was found in the entire colon that is likely due to one of the       polypectomy sites from the left colon.      A single (solitary) fourteen mm ulcer was found in the cecum. No       bleeding was present. Stigmata of recent bleeding were present. Area was       successfully injected with 8 mL of a 1:10,000 solution of epinephrine       for hemostasis. Estimated blood loss was minimal. For location marking,       one hemostatic clip was successfully placed. There was no bleeding at        the end of the maneuver. Impression:               - Catheter perianally with clot noted; external                            hemorrhoids found on perianal exam.                           - Blood in the entire examined colon.                           - A single (solitary) ulcer in the cecum. Injected.                            Clip was placed.                           - No specimens collected. Recommendation:           - NPO.                           - Miralax prep in morning to flush out the                            remaining blood and may need another colonoscopy to                            assess the left colon. If patient becomes                            hemodynamically unstable then may need an angiogram                            and possible embolization. Aggressive volume  resuscitation. Procedure Code(s):        --- Professional ---                           [phone], Colonoscopy, flexible; with control of                            bleeding, any method                           [phone], Unlisted procedure, colon Diagnosis Code(s):        --- Professional ---                           K91.840, Postprocedural hemorrhage of a digestive                            system organ or structure following a digestive                            system procedure                           K92.1, Melena (includes Hematochezia)                           K92.2, Gastrointestinal hemorrhage, unspecified                           K63.3, Ulcer of intestine CPT copyright 2019 American Medical Association. All rights reserved. The codes documented in this report are preliminary and upon coder review may  be revised to meet current compliance requirements. [name], MD 02/01/2020 12:36:31 AM This report has been signed electronically. Number of Addenda: 0

## 2020-02-01 NOTE — Progress Notes (Signed)
Patient with continued GI bleeding.  He had a nuclear medicine scan that was positive for over time.  Discussed case with interventional radiology and CT angiography was obtained.  CT angiography is positive for acute bleed in the colon.  Discussed with Dr. Reesa Chew who is going to come in and perform interventional radiology embolization. Patient did have blood pressure drop briefly down to 95 systolically.  Given LR bolus of 500 mL now. Discussed CT findings with patient and his wife who is at bedside.  Discussed procedure of embolization which patient wants to proceed with.  Discussed risks of bleeding and infection with procedure but that procedure will be done in sterile manner which patient verbalized understanding and wishes to proceed.

## 2020-02-01 NOTE — Progress Notes (Signed)
[facility] Progress Note  Subjective: Patient seen in follow-up in regards to post polypectomy bleed.  Results of colonoscopy from Dr. Michail Sermon noted from last night.  According to the patient things seem to be slowing down quite a bit from a bleeding standpoint.  Objective: Vital signs in last 24 hours: Temp:  [97.9 F (36.6 C)-98.9 F (37.2 C)] 97.9 F (36.6 C) (08/18 1108) Pulse Rate:  [82-106] 82 (08/18 1108) Resp:  [11-26] 16 (08/18 1108) BP: (102-163)/(53-95) 121/73 (08/18 1108) SpO2:  [92 %-100 %] 100 % (08/18 1108) Weight:  [86.2 kg-109.9 kg] 109.9 kg (08/18 0127) Weight change:    PE:  No distress  Heart regular rhythm  Abdomen soft and nontender  Lab Results: Results for orders placed or performed during the hospital encounter of 01/31/20 (from the past 24 hour(s))  Type and screen [place]     Status: None   Collection Time: 01/31/20  5:20 PM  Result Value Ref Range   ABO/RH(D) A NEG    Antibody Screen NEG    Sample Expiration 02/03/2020,2359    Unit Number [id number]    Blood Component Type RED CELLS,LR    Unit division 00    Status of Unit ISSUED,FINAL    Transfusion Status OK TO TRANSFUSE    Crossmatch Result Compatible    Unit Number [id number]    Blood Component Type RBC LR PHER2    Unit division 00    Status of Unit ISSUED,FINAL    Transfusion Status OK TO TRANSFUSE    Crossmatch Result      Compatible Performed at [facility], [address] [place], [place] [address]   Comprehensive metabolic panel     Status: Abnormal   Collection Time: 01/31/20  5:34 PM  Result Value Ref Range   Sodium 138 135 - 145 mmol/L   Potassium 4.0 3.5 - 5.1 mmol/L   Chloride 103 98 - 111 mmol/L   CO2 23 22 - 32 mmol/L   Glucose, Bld 230 (H) 70 - 99 mg/dL   BUN 13 8 - 23 mg/dL   Creatinine, Ser 1.00 0.61 - 1.24 mg/dL   Calcium 9.0 8.9 - 10.3 mg/dL   Total Protein 6.4 (L) 6.5 - 8.1 g/dL   Albumin 3.4 (L) 3.5 - 5.0  g/dL   AST 20 15 - 41 U/L   ALT 18 0 - 44 U/L   Alkaline Phosphatase 26 (L) 38 - 126 U/L   Total Bilirubin 0.9 0.3 - 1.2 mg/dL   GFR calc non Af Amer >60 >60 mL/min   GFR calc Af Amer >60 >60 mL/min   Anion gap 12 5 - 15  CBC     Status: Abnormal   Collection Time: 01/31/20  5:34 PM  Result Value Ref Range   WBC 15.6 (H) 4.0 - 10.5 K/uL   RBC 4.02 (L) 4.22 - 5.81 MIL/uL   Hemoglobin 11.7 (L) 13.0 - 17.0 g/dL   HCT 38.4 (L) 39 - 52 %   MCV 95.5 80.0 - 100.0 fL   MCH 29.1 26.0 - 34.0 pg   MCHC 30.5 30.0 - 36.0 g/dL   RDW 14.0 11.5 - 15.5 %   Platelets 310 150 - 400 K/uL   nRBC 0.0 0.0 - 0.2 %  ABO/Rh     Status: None   Collection Time: 01/31/20  6:30 PM  Result Value Ref Range   ABO/RH(D)      A NEG Performed at [facility], [place] [address], [place], [place] [address]   SARS Coronavirus 2 by RT PCR (hospital order, performed in [facility] hospital lab) Nasopharyngeal Nasopharyngeal Swab     Status: None   Collection Time: 01/31/20  6:34 PM   Specimen: Nasopharyngeal Swab  Result Value Ref Range   SARS Coronavirus 2 NEGATIVE NEGATIVE  Prepare RBC (crossmatch)     Status: None   Collection Time: 01/31/20  7:00 PM  Result Value Ref Range   Order Confirmation      ORDER PROCESSED BY BLOOD BANK Performed at [facility], [place] [address] [place], [place] [address]   Prepare RBC (crossmatch)     Status: None   Collection Time: 01/31/20 10:00 PM  Result Value Ref Range   Order Confirmation      ORDER PROCESSED BY BLOOD BANK Performed at [facility], [place] [address] [place], [place] [address]   Glucose, capillary     Status: Abnormal   Collection Time: 01/31/20 11:09 PM  Result Value Ref Range   Glucose-Capillary 109 (H) 70 - 99 mg/dL  Glucose, capillary     Status: Abnormal   Collection Time: 02/01/20 12:38 AM  Result Value Ref Range   Glucose-Capillary 136 (H) 70 - 99 mg/dL  CBC with Differential/Platelet     Status: Abnormal   Collection Time:  02/01/20  2:19 AM  Result Value Ref Range   WBC 9.7 4.0 - 10.5 K/uL   RBC 3.60 (L) 4.22 - 5.81 MIL/uL   Hemoglobin 10.8 (L) 13.0 - 17.0 g/dL   HCT 32.3 (L) 39 - 52 %   MCV 89.7 80.0 - 100.0 fL   MCH 30.0 26.0 - 34.0 pg   MCHC 33.4 30.0 - 36.0 g/dL   RDW 14.3 11.5 - 15.5 %   Platelets 155 150 - 400 K/uL   nRBC 0.0 0.0 - 0.2 %   Neutrophils Relative % 81 %   Neutro Abs 7.9 (H) 1.7 - 7.7 K/uL   Lymphocytes Relative 10 %   Lymphs Abs 1.0 0.7 - 4.0 K/uL   Monocytes Relative 8 %   Monocytes Absolute 0.7 0 - 1 K/uL   Eosinophils Relative 0 %   Eosinophils Absolute 0.0 0 - 0 K/uL   Basophils Relative 0 %   Basophils Absolute 0.0 0 - 0 K/uL   Immature Granulocytes 1 %   Abs Immature Granulocytes 0.07 0.00 - 0.07 K/uL  HIV Antibody (routine testing w rflx)     Status: None   Collection Time: 02/01/20  2:19 AM  Result Value Ref Range   HIV Screen 4th Generation wRfx Non Reactive Non Reactive  Basic metabolic panel     Status: Abnormal   Collection Time: 02/01/20  2:19 AM  Result Value Ref Range   Sodium 137 135 - 145 mmol/L   Potassium 4.2 3.5 - 5.1 mmol/L   Chloride 104 98 - 111 mmol/L   CO2 23 22 - 32 mmol/L   Glucose, Bld 127 (H) 70 - 99 mg/dL   BUN 11 8 - 23 mg/dL   Creatinine, Ser 0.89 0.61 - 1.24 mg/dL   Calcium 8.2 (L) 8.9 - 10.3 mg/dL   GFR calc non Af Amer >60 >60 mL/min   GFR calc Af Amer >60 >60 mL/min   Anion gap 10 5 - 15  Glucose, capillary     Status: Abnormal   Collection Time: 02/01/20  6:22 AM  Result Value Ref Range   Glucose-Capillary  108 (H) 70 - 99 mg/dL  Glucose, capillary     Status: Abnormal   Collection Time: 02/01/20  8:39 AM  Result Value Ref Range   Glucose-Capillary 118 (H) 70 - 99 mg/dL  Glucose, capillary     Status: Abnormal   Collection Time: 02/01/20 11:11 AM  Result Value Ref Range   Glucose-Capillary 143 (H) 70 - 99 mg/dL    Studies/Results: CT ABDOMEN PELVIS W CONTRAST  Result Date: 01/31/2020 CLINICAL DATA:  Abdominal distension  and rectal bleeding after colonoscopy today EXAM: CT ABDOMEN AND PELVIS WITH CONTRAST TECHNIQUE: Multidetector CT imaging of the abdomen and pelvis was performed using the standard protocol following bolus administration of intravenous contrast. CONTRAST:  131mL OMNIPAQUE IOHEXOL 300 MG/ML  SOLN COMPARISON:  01/31/2020 FINDINGS: Lower chest: No acute pleural or parenchymal lung disease. Hepatobiliary: No focal liver abnormality is seen. No gallstones, gallbladder wall thickening, or biliary dilatation. Pancreas: Unremarkable. No pancreatic ductal dilatation or surrounding inflammatory changes. Spleen: Normal in size without focal abnormality. Adrenals/Urinary Tract: Adrenal glands are unremarkable. Kidneys are normal, without renal calculi, focal lesion, or hydronephrosis. Bladder is unremarkable. Stomach/Bowel: No bowel obstruction or ileus. Normal appendix right lower quadrant. No bowel wall thickening or inflammatory change. Vascular/Lymphatic: Aortic atherosclerosis. No enlarged abdominal or pelvic lymph nodes. Reproductive: Prostate is unremarkable. Other: No free fluid or free intra-abdominal gas. No abdominal wall hernia. Musculoskeletal: No acute or destructive bony lesion. Reconstructed images demonstrate no additional findings. IMPRESSION: 1. No evidence of complication after colonoscopy. No bowel perforation. 2. No acute intra-abdominal or intrapelvic process. 3.  Aortic Atherosclerosis (ICD10-I70.0). Electronically Signed   By: Randa Ngo M.D.   On: 01/31/2020 19:26   DG Chest Port 1 View  Result Date: 01/31/2020 CLINICAL DATA:  Abdominal pain and rectal bleeding after colonoscopy EXAM: PORTABLE CHEST 1 VIEW COMPARISON:  03/16/2016 FINDINGS: 2 frontal views of the chest demonstrate an unremarkable cardiac silhouette. No airspace disease, effusion, or pneumothorax. No evidence of free gas in the greater peritoneal sac. IMPRESSION: 1. No acute intrathoracic process. Electronically Signed   By:  Randa Ngo M.D.   On: 01/31/2020 19:43   DG Abd Portable 1 View  Result Date: 01/31/2020 CLINICAL DATA:  Abdominal pain and rectal bleeding status post colonoscopy. EXAM: PORTABLE ABDOMEN - 1 VIEW COMPARISON:  None. FINDINGS: The bowel gas pattern is normal. There is no evidence of free air. No radio-opaque calculi or other significant radiographic abnormality are seen. IMPRESSION: Negative. Electronically Signed   By: Virgina Norfolk M.D.   On: 01/31/2020 18:48      Assessment: Post polypectomy bleeding  Plan:   Continue supportive care for now.  Transfuse blood as needed.  If further significant bleeding occurs I would recommend a bleeding scan and if positive angiogram with embolization    Cassell Clement 02/01/2020, 11:38 AM  Pager: [phone] If no answer or after 5 PM call [phone] Lab Results  Component Value Date   HGB 10.8 (L) 02/01/2020   HGB 11.7 (L) 01/31/2020   HGB 11.2 (L) 03/20/2016   HCT 32.3 (L) 02/01/2020   HCT 38.4 (L) 01/31/2020   HCT 35.1 (L) 03/20/2016   ALKPHOS 26 (L) 01/31/2020   ALKPHOS 39 03/15/2016   ALKPHOS 58 03/14/2016   AST 20 01/31/2020   AST 22 03/15/2016   AST 20 03/14/2016   ALT 18 01/31/2020   ALT 10 (L) 03/15/2016   ALT 16 (L) 03/14/2016

## 2020-02-01 NOTE — Anesthesia Procedure Notes (Signed)
Procedure Name: MAC Date/Time: 01/31/2020 11:16 PM Performed by: Claris Che, CRNA Pre-anesthesia Checklist: Patient identified, Emergency Drugs available, Suction available, Patient being monitored and Timeout performed Patient Re-evaluated:Patient Re-evaluated prior to induction Oxygen Delivery Method: Simple face mask

## 2020-02-01 NOTE — Anesthesia Postprocedure Evaluation (Signed)
Anesthesia Post Note  Patient: TRAVON CROCHET  Procedure(s) Performed: COLONOSCOPY WITH PROPOFOL (N/A ) HEMOSTASIS CLIP PLACEMENT SCLEROTHERAPY     Patient location during evaluation: PACU Anesthesia Type: MAC Level of consciousness: awake and alert, patient cooperative and oriented Pain management: pain level controlled (Pt has back pain, requesting pillows) Vital Signs Assessment: post-procedure vital signs reviewed and stable Respiratory status: spontaneous breathing, nonlabored ventilation and respiratory function stable Cardiovascular status: blood pressure returned to baseline and stable Postop Assessment: no apparent nausea or vomiting Anesthetic complications: no   No complications documented.  Last Vitals:  Vitals:   01/31/20 2314 02/01/20 0037  BP: (!) 155/95 136/73  Pulse: 96 95  Resp: 16 18  Temp: 37.2 C 36.8 C  SpO2: 98% 99%    Last Pain:  Vitals:   02/01/20 0037  TempSrc:   PainSc: 7                  Toluwanimi Radebaugh,E. Kalyna Paolella

## 2020-02-02 ENCOUNTER — Encounter (HOSPITAL_COMMUNITY): Payer: Self-pay | Admitting: Gastroenterology

## 2020-02-02 DIAGNOSIS — R578 Other shock: Secondary | ICD-10-CM

## 2020-02-02 DIAGNOSIS — I480 Paroxysmal atrial fibrillation: Secondary | ICD-10-CM

## 2020-02-02 HISTORY — PX: IR ANGIOGRAM SELECTIVE EACH ADDITIONAL VESSEL: IMG667

## 2020-02-02 HISTORY — PX: IR US GUIDE VASC ACCESS RIGHT: IMG2390

## 2020-02-02 HISTORY — PX: IR EMBO ART  VEN HEMORR LYMPH EXTRAV  INC GUIDE ROADMAPPING: IMG5450

## 2020-02-02 HISTORY — PX: IR ANGIOGRAM VISCERAL SELECTIVE: IMG657

## 2020-02-02 LAB — GLUCOSE, CAPILLARY
Glucose-Capillary: 215 mg/dL — ABNORMAL HIGH (ref 70–99)
Glucose-Capillary: 125 mg/dL — ABNORMAL HIGH (ref 70–99)
Glucose-Capillary: 155 mg/dL — ABNORMAL HIGH (ref 70–99)
Glucose-Capillary: 181 mg/dL — ABNORMAL HIGH (ref 70–99)
Glucose-Capillary: 136 mg/dL — ABNORMAL HIGH (ref 70–99)
Glucose-Capillary: 144 mg/dL — ABNORMAL HIGH (ref 70–99)

## 2020-02-02 MED ORDER — FENTANYL CITRATE (PF) 100 MCG/2ML IJ SOLN
INTRAMUSCULAR | Status: AC | PRN
  Administered 2020-02-01 – 2020-02-02 (×2): 25 ug via INTRAVENOUS

## 2020-02-02 MED ORDER — MIDAZOLAM HCL 2 MG/2ML IJ SOLN
INTRAMUSCULAR | Status: AC | PRN
  Administered 2020-02-02: 0.5 mg via INTRAVENOUS

## 2020-02-02 MED ORDER — IOHEXOL 300 MG/ML  SOLN
150.0000 mL | Freq: Once | INTRAMUSCULAR | Status: AC | PRN
  Administered 2020-02-02: 60 mL via INTRA_ARTERIAL

## 2020-02-02 MED ORDER — FENTANYL CITRATE (PF) 100 MCG/2ML IJ SOLN
INTRAMUSCULAR | Status: AC
  Filled 2020-02-02: qty 2

## 2020-02-02 MED ORDER — MIDAZOLAM HCL 2 MG/2ML IJ SOLN
INTRAMUSCULAR | Status: AC
  Filled 2020-02-02: qty 2

## 2020-02-02 MED ORDER — MIDAZOLAM HCL 2 MG/2ML IJ SOLN
INTRAMUSCULAR | Status: AC | PRN
  Administered 2020-02-02: 0.5 mg via INTRAVENOUS
  Administered 2020-02-02: 1 mg via INTRAVENOUS

## 2020-02-02 MED ORDER — FENTANYL CITRATE (PF) 100 MCG/2ML IJ SOLN
INTRAMUSCULAR | Status: AC | PRN
Start: 2020-02-02 — End: 2020-02-02
  Administered 2020-02-02: 25 ug via INTRAVENOUS
  Administered 2020-02-02: 50 ug via INTRAVENOUS

## 2020-02-02 MED ORDER — GELATIN ABSORBABLE 12-7 MM EX MISC
CUTANEOUS | Status: AC
  Filled 2020-02-02: qty 1

## 2020-02-02 MED ORDER — IOHEXOL 300 MG/ML  SOLN
150.0000 mL | Freq: Once | INTRAMUSCULAR | Status: AC | PRN
  Administered 2020-02-02: 25 mL via INTRA_ARTERIAL

## 2020-02-02 NOTE — Progress Notes (Signed)
Patient's case discussed with the nurse and interventional radiology 2 times last night each to discuss the results of both the bleeding scan the blood count and the CT scan and to help ensure that interventional radiology proceeded with angiogram and my thanks to Dr. Reesa Chew for proceeding

## 2020-02-02 NOTE — Procedures (Addendum)
Interventional Radiology Procedure Note  Procedure: visceral angio with peripheral micro coil embo of 2 right colic branches to the cecum.  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 Daryll Brod, MD

## 2020-02-02 NOTE — Progress Notes (Signed)
Patient was seen today.  He had further bleeding yesterday afternoon.  He underwent a GI bleeding scan and CT angiography both showing active bleeding in the cecum.  Patient underwent angiography with embolization of 2 right colic branches to the cecum.  Patient states he is doing much better today.  No significant bleeding since procedure.  Hemoglobin stable.  We will continue to monitor.

## 2020-02-02 NOTE — Progress Notes (Signed)
Interventional Radiology Brief Note:  Brief follow-up with patient after procedure this AM.  Groin site soft, intact.  No oozing or bleeding.  Passing small amounts of bloody stool which should hopefully improve.  Trialing clear liquids (did have a red icee this AM).  IR to follow for immediate post-procedure care.   Brynda Greathouse, MS RD PA-C 12:24 PM

## 2020-02-02 NOTE — Progress Notes (Signed)
[facility] PROGRESS NOTE    Progress Note  Jonathan Tucker  ZOX:[id number] DOB: 1951/01/11 DOA: 01/31/2020 PCP: Lennie Odor, PA     Brief Narrative:   Jonathan Tucker is an 69 y.o. male atrial fibrillation on aspirin 325, this type II, essential hypertension was brought into the ED after multiple episode of rectal bleeding post colonoscopy with 8 polyps removed she done that afternoon developed frank rectal bleeding with abdominal discomfort in the ER she was found to have a syncopal episode did not hit her head her hemoglobin was around 11.  The ER patient had a syncopal episode and did not hit her head systolic blood pressure was in the 80s she was fluid resuscitated, CT scan of the abdomen and pelvis did not show any acute findings Covid PCR was negative patient had to be taken for urgent colonoscopy and she received 2 additional units of packed red blood cells.  Assessment/Plan:   Acute GI bleeding: Status post colonoscopy that showed ulcers in the cecum. She is status post 2 units of packed red blood cells. 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 Continue to monitor hemoglobin closely and transfuse if symptomatic or hemoglobin less than 7.  Hemorrhagic shock: Status post 2 units of packed red blood cells continue to monitor hemoglobin closely.  Paroxysmal atrial fibrillation: Rate controlled not on anticoagulation only taking aspirin at home continue to hold. Beta-blocker is on hold due to hypotensive episode.  Uncontrolled diabetes mellitus ([place]) He is currently on a clear liquid diet, only requiring 1 to 2 units of sliding scale to control blood glucose, continue to monitor CBGs every 4. Continue current diet.  COPD (chronic obstructive pulmonary  disease) ([place]) Noted.   DVT prophylaxis: scd Family Communication:none Status is: Inpatient  Remains inpatient appropriate because:Hemodynamically unstable   Dispo: The patient is from: Home              Anticipated d/c is to: Home              Anticipated d/c date is: 2 days              Patient currently is not medically stable to d/c.        Code Status:     Code Status Orders  (From admission, onward)         Start     Ordered   01/31/20 2232  Full code  Continuous        01/31/20 2232        Code Status History    Date Active Date Inactive Code Status Order ID Comments User Context   03/14/2016 1255 03/25/2016 1713 Full Code [id number]  Murlean Iba, MD ED   Advance Care Planning Activity    Advance Directive Documentation     Most Recent Value  Type of Advance Directive Living will  Pre-existing out of facility DNR order (yellow form or pink MOST form) --  "MOST" Form in Place? --        IV Access:    Peripheral IV   Procedures and diagnostic studies:   NM GI Blood Loss  Result Date: 02/01/2020 CLINICAL DATA:  GI bleed. History of colonoscopy yesterday with 8 polyps removed. EXAM: NUCLEAR MEDICINE GASTROINTESTINAL BLEEDING SCAN TECHNIQUE: Sequential abdominal images were  obtained following intravenous administration of Tc-70m labeled red blood cells. RADIOPHARMACEUTICALS:  26.5 mCi Tc-42m pertechnetate in-vitro labeled red cells. COMPARISON:  CT scan 01/31/2020 FINDINGS: At 10 minutes there is a focus of activity noted in the right abdomen in the region of the hepatic flexure. This shows progressive increased intensity. Activity then moves across the transverse colon to the splenic flexure and down into the descending colon. Findings consistent with a bleeding source in the right colon near the hepatic flexure. IMPRESSION: Findings consistent with a bleeding source in the right colon near the hepatic flexure. Electronically Signed   By: Marijo Sanes M.D.   On: 02/01/2020 20:57   CT ABDOMEN PELVIS W CONTRAST  Result Date: 01/31/2020 CLINICAL DATA:  Abdominal distension and rectal bleeding after colonoscopy today EXAM: CT ABDOMEN AND PELVIS WITH CONTRAST TECHNIQUE: Multidetector CT imaging of the abdomen and pelvis was performed using the standard protocol following bolus administration of intravenous contrast. CONTRAST:  143mL OMNIPAQUE IOHEXOL 300 MG/ML  SOLN COMPARISON:  01/31/2020 FINDINGS: Lower chest: No acute pleural or parenchymal lung disease. Hepatobiliary: No focal liver abnormality is seen. No gallstones, gallbladder wall thickening, or biliary dilatation. Pancreas: Unremarkable. No pancreatic ductal dilatation or surrounding inflammatory changes. Spleen: Normal in size without focal abnormality. Adrenals/Urinary Tract: Adrenal glands are unremarkable. Kidneys are normal, without renal calculi, focal lesion, or hydronephrosis. Bladder is unremarkable. Stomach/Bowel: No bowel obstruction or ileus. Normal appendix right lower quadrant. No bowel wall thickening or inflammatory change. Vascular/Lymphatic: Aortic atherosclerosis. No enlarged abdominal or pelvic lymph nodes. Reproductive: Prostate is unremarkable. Other: No free fluid or free intra-abdominal gas. No abdominal wall hernia. Musculoskeletal: No acute or destructive bony lesion. Reconstructed images demonstrate no additional findings. IMPRESSION: 1. No evidence of complication after colonoscopy. No bowel perforation. 2. No acute intra-abdominal or intrapelvic process. 3.  Aortic Atherosclerosis (ICD10-I70.0). Electronically Signed   By: Randa Ngo M.D.   On: 01/31/2020 19:26   DG Chest Port 1 View  Result Date: 01/31/2020 CLINICAL DATA:  Abdominal pain and rectal bleeding after colonoscopy EXAM: PORTABLE CHEST 1 VIEW COMPARISON:  03/16/2016 FINDINGS: 2 frontal views of the chest demonstrate an unremarkable cardiac silhouette. No airspace disease, effusion, or pneumothorax.  No evidence of free gas in the greater peritoneal sac. IMPRESSION: 1. No acute intrathoracic process. Electronically Signed   By: Randa Ngo M.D.   On: 01/31/2020 19:43   DG Abd Portable 1 View  Result Date: 01/31/2020 CLINICAL DATA:  Abdominal pain and rectal bleeding status post colonoscopy. EXAM: PORTABLE ABDOMEN - 1 VIEW COMPARISON:  None. FINDINGS: The bowel gas pattern is normal. There is no evidence of free air. No radio-opaque calculi or other significant radiographic abnormality are seen. IMPRESSION: Negative. Electronically Signed   By: Virgina Norfolk M.D.   On: 01/31/2020 18:48   CT Angio Abd/Pel w/ and/or w/o  Result Date: 02/01/2020 CLINICAL DATA:  GI bleed. Colonoscopy yesterday with a polyps removed. EXAM: CTA ABDOMEN AND PELVIS WITHOUT AND WITH CONTRAST TECHNIQUE: Multidetector CT imaging of the abdomen and pelvis was performed using the standard protocol during bolus administration of intravenous contrast. Multiplanar reconstructed images and MIPs were obtained and reviewed to evaluate the vascular anatomy. CONTRAST:  132mL OMNIPAQUE IOHEXOL 350 MG/ML SOLN COMPARISON:  GI bleeding scan earlier today with active bleeding at the hepatic flexure. Abdominal CT yesterday. FINDINGS: VASCULAR Aorta: Moderate atherosclerosis. Calcified and noncalcified plaque with irregular noncalcified plaque anteriorly. 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 No dissection or vasculitis.  Celiac: Patent without evidence of aneurysm, dissection, vasculitis or significant stenosis. SMA: Patent without evidence of aneurysm, dissection, vasculitis or significant stenosis. Renals: 2 right and single left renal arteries. Plaque at the origin of all renal arteries without significant stenosis. No acute findings or FMD. IMA: Patent. Inflow: Moderately calcified and tortuous. No stenosis, dissection or acute findings. Proximal Outflow: Moderately calcified. Moderate to high-grade stenosis  involving the left common femoral artery. Mild to moderate stenosis involving the proximal right common femoral artery. No acute findings. Veins: Venous phase imaging demonstrates patent portal vein. No evidence of mesenteric venous thrombus. No acute venous abnormality. Circumaortic left renal vein. Review of the MIP images confirms the above findings. NON-VASCULAR Lower chest: No acute findings. No pleural fluid or consolidation. Thin walled cysts in the lower lobes. Heart is normal in size. Coronary artery calcifications. Hepatobiliary: No focal liver abnormality is seen. Minimal layering stones or sludge in the gallbladder without pericholecystic inflammation. Pancreas: No ductal dilatation or inflammation. Spleen: Normal in size without focal abnormality. Adrenals/Urinary Tract: Normal adrenal glands. No hydronephrosis or perinephric edema. Homogeneous renal enhancement. Small low-density lesions in both kidneys are too small to characterize. Urinary bladder is physiologically distended without wall thickening. Stomach/Bowel: There is active extravasation of contrast into the GI track in the cecum adjacent to a surgical clip. This is seen on arterial axial images series 6, images 79 and 80, and persist on venous phase, series 8, images 48 and 49. Liquid contents in the ascending and hepatic flexure of the colon. There are scattered ascending colonic diverticula without diverticulitis. Fluid in the transverse and descending colon. Mild distal colonic diverticulosis without diverticulitis. Unremarkable stomach and small bowel. Normal appendix. Lymphatic: No abdominopelvic adenopathy. Reproductive: Prostate is unremarkable. Other: No ascites or free air. There is fat in both inguinal canals. Musculoskeletal: There are no acute or suspicious osseous abnormalities. IMPRESSION: 1. Positive for active extravasation of contrast into the GI track in the cecum adjacent to a surgical clip consistent with acute GI bleed.  This correlates to the site of active bleeding on GI bleeding scan earlier today, the cecum is slightly high-riding in the mid abdomen. 2. Aortic atherosclerosis. 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 Recommend follow-up every 3 years. This recommendation follows ACR consensus guidelines: White Paper of the ACR Incidental Findings Committee II on Vascular Findings. J Am Coll Radiol 2013; 10:789-794. 3. Colonic diverticulosis without diverticulitis. 4. Minimal layering stones or sludge in the gallbladder without gallbladder inflammation. 5. 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 Aortic Atherosclerosis (ICD10-I70.0). These results will be called to the ordering clinician or representative by the Radiologist Assistant, and communication documented in the PACS or [employer]. Electronically Signed   By: Keith Rake M.D.   On: 02/01/2020 22:38     Medical Consultants:    None.  Anti-Infectives:   none  Subjective:    Eulah Pont relates he had a bloody bowel movement that was small this morning.  Chest mild tenesmus.  Objective:    Vitals:   02/02/20 0055 02/02/20 0100 02/02/20 0418 02/02/20 0739  BP: 134/83  137/77 111/70  Pulse: 93 91 97 98  Resp: 15  16 18   Temp:   98.1 F (36.7 C) 98 F (36.7 C)  TempSrc:   Oral Oral  SpO2: 100%  100% 95%  Weight:      Height:       SpO2: 95 % O2 Flow Rate (  L/min): 2 L/min   Intake/Output Summary (Last 24 hours) at 02/02/2020 0935 Last data filed at 02/02/2020 0759 Gross per 24 hour  Intake 1663.99 ml  Output 2275 ml  Net -611.01 ml   Filed Weights   01/31/20 1724 02/01/20 0127  Weight: 86.2 kg 109.9 kg    Exam: General exam: In no acute distress. Respiratory system: Good air movement and clear to auscultation. Cardiovascular system: S1 & S2 heard, RRR. No JVD. Gastrointestinal system: Abdomen is nondistended, soft and nontender.  Extremities: No pedal  edema. Skin: No rashes, lesions or ulcers Psychiatry: Judgement and insight appear normal. Mood & affect appropriate.    Data Reviewed:    Labs: Basic Metabolic Panel: Recent Labs  Lab 01/31/20 1734 02/01/20 0219  NA 138 137  K 4.0 4.2  CL 103 104  CO2 23 23  GLUCOSE 230* 127*  BUN 13 11  CREATININE 1.00 0.89  CALCIUM 9.0 8.2*   GFR Estimated Creatinine Clearance: 97.3 mL/min (by C-G formula based on SCr of 0.89 mg/dL). Liver Function Tests: Recent Labs  Lab 01/31/20 1734  AST 20  ALT 18  ALKPHOS 26*  BILITOT 0.9  PROT 6.4*  ALBUMIN 3.4*   No results for input(s): LIPASE, AMYLASE in the last 168 hours. No results for input(s): AMMONIA in the last 168 hours. Coagulation profile No results for input(s): INR, PROTIME in the last 168 hours. COVID-19 Labs  No results for input(s): DDIMER, FERRITIN, LDH, CRP in the last 72 hours.  Lab Results  Component Value Date   [place] NEGATIVE 01/31/2020    CBC: Recent Labs  Lab 01/31/20 1734 02/01/20 0219 02/01/20 2144  WBC 15.6* 9.7 7.4  NEUTROABS  --  7.9*  --   HGB 11.7* 10.8* 8.9*  HCT 38.4* 32.3* 28.6*  MCV 95.5 89.7 91.7  PLT 310 155 186   Cardiac Enzymes: No results for input(s): CKTOTAL, CKMB, CKMBINDEX, TROPONINI in the last 168 hours. BNP (last 3 results) No results for input(s): PROBNP in the last 8760 hours. CBG: Recent Labs  Lab 02/01/20 0622 02/01/20 0839 02/01/20 1111 02/01/20 1641 02/02/20 0845  GLUCAP 108* 118* 143* 138* 155*   D-Dimer: No results for input(s): DDIMER in the last 72 hours. Hgb A1c: No results for input(s): HGBA1C in the last 72 hours. Lipid Profile: No results for input(s): CHOL, HDL, LDLCALC, TRIG, CHOLHDL, LDLDIRECT in the last 72 hours. Thyroid function studies: No results for input(s): TSH, T4TOTAL, T3FREE, THYROIDAB in the last 72 hours.  Invalid input(s): FREET3 Anemia work up: No results for input(s): VITAMINB12, FOLATE, FERRITIN, TIBC, IRON,  RETICCTPCT in the last 72 hours. Sepsis Labs: Recent Labs  Lab 01/31/20 1734 02/01/20 0219 02/01/20 2144  WBC 15.6* 9.7 7.4   Microbiology Recent Results (from the past 240 hour(s))  SARS Coronavirus 2 by RT PCR (hospital order, performed in [facility] hospital lab) Nasopharyngeal Nasopharyngeal Swab     Status: None   Collection Time: 01/31/20  6:34 PM   Specimen: Nasopharyngeal Swab  Result Value Ref Range Status   SARS Coronavirus 2 NEGATIVE NEGATIVE Final    Comment: (NOTE) SARS-CoV-2 target nucleic acids are NOT DETECTED.  The SARS-CoV-2 RNA is generally detectable in upper and lower respiratory specimens during the acute phase of infection. The lowest concentration of SARS-CoV-2 viral copies this assay can detect is 250 copies / mL. A negative result does not preclude SARS-CoV-2 infection and should not be used as the sole basis for treatment or other patient management decisions.  A negative result may occur with improper specimen collection / handling, submission of specimen other than nasopharyngeal swab, presence of viral mutation(s) within the areas targeted by this assay, and inadequate number of viral copies (<250 copies / mL). A negative result must be combined with clinical observations, patient history, and epidemiological information.  Fact Sheet for Patients:   StrictlyIdeas.no  Fact Sheet for Healthcare Providers: BankingDealers.co.za  This test is not yet approved or  cleared by the [place] FDA and has been authorized for detection and/or diagnosis of SARS-CoV-2 by FDA under an Emergency Use Authorization (EUA).  This EUA will remain in effect (meaning this test can be used) for the duration of the COVID-19 declaration under Section 564(b)(1) of the Act, 21 U.S.C. section 360bbb-3(b)(1), unless the authorization is terminated or revoked sooner.  Performed at [facility], [place] [address]  [place], [place] [address]      Medications:    fentaNYL       insulin aspart  0-6 Units Subcutaneous Q4H   lidocaine       midazolam       pantoprazole (PROTONIX) IV  40 mg Intravenous Q12H   Continuous Infusions:  sodium chloride 20 mL/hr at 02/01/20 0615      LOS: 1 day   Charlynne Cousins  [facility]  02/02/2020, 9:35 AM

## 2020-02-02 NOTE — Evaluation (Signed)
Physical Therapy Evaluation Patient Details Name: Jonathan Tucker MRN: [id number] DOB: Oct 15, 1950 Today's Date: 02/02/2020   History of Present Illness  69 yo admitted to ED 8/17 with GI bleed after colonoscopy with polyp removal that morning. Pt with repeat colonoscopy with acute colon bleed s/p embolization. PMHx: AFib, DM, HTN, HLD  Clinical Impression  Pt fatigued from events since admission and reports fear of mobilizing for continued bleeding. Pt limited by fatigue and lightheadedness with mobility at this time but willing to attempt. Pt able to stand grossly 2 min prior to needing to sit. Pt with decreased mobility and activity tolerance at present who will benefit from acute therapy to maximize mobility, safety and function to decrease burden of care. Pt encouraged to mobilize to chair today with staff and short distance ambulation if able with slow transitional movements to check BP.   Sitting 128/83 (95) Standing 110/70 (84)     Follow Up Recommendations No PT follow up    Equipment Recommendations  None recommended by PT    Recommendations for Other Services       Precautions / Restrictions Precautions Precautions: Fall      Mobility  Bed Mobility Overal bed mobility: Modified Independent             General bed mobility comments: transition from supine to and from sit with HOB 20 degrees with increased time without assist  Transfers Overall transfer level: Modified independent               General transfer comment: supervision for safety with monitoring for BP. pt with slight dizziness with sitting with BP 128/83 and 111/70 with standing. pt stood grossly 2 min then requested sitting due to fatigue. Deferred OOB at this time but encouraged to perform later today with staff  Ambulation/Gait                Stairs            Wheelchair Mobility    Modified Rankin (Stroke Patients Only)       Balance Overall balance assessment: No  apparent balance deficits (not formally assessed)                                           Pertinent Vitals/Pain Pain Assessment: 0-10 Pain Score: 3  Pain Location: abdomen Pain Descriptors / Indicators: Aching;Guarding Pain Intervention(s): Limited activity within patient's tolerance;Monitored during session    [place] expects to be discharged to:: Private residence Living Arrangements: Spouse/significant other Available Help at Discharge: Family;Available 24 hours/day Type of Home: House Home Access: Stairs to enter   [employer] of Steps: 3 Home Layout: One level Home Equipment: Walker - 2 wheels;Bedside commode;Cane - single point      Prior Function Level of Independence: Independent               Hand Dominance        Extremity/Trunk Assessment   Upper Extremity Assessment Upper Extremity Assessment: Overall WFL for tasks assessed    Lower Extremity Assessment Lower Extremity Assessment: Overall WFL for tasks assessed    Cervical / Trunk Assessment Cervical / Trunk Assessment: Normal  Communication   Communication: No difficulties  Cognition Arousal/Alertness: Awake/alert Behavior During Therapy: WFL for tasks assessed/performed Overall Cognitive Status: Within Functional Limits for tasks assessed  General Comments      Exercises     Assessment/Plan    PT Assessment Patient needs continued PT services  PT Problem List Decreased mobility;Decreased activity tolerance;Cardiopulmonary status limiting activity       PT Treatment Interventions DME instruction;Therapeutic activities;Gait training;Stair training;Functional mobility training;Patient/family education;Therapeutic exercise    PT Goals (Current goals can be found in the Care Plan section)  Acute Rehab PT Goals Patient Stated Goal: return home PT Goal Formulation: With  patient/family Time For Goal Achievement: 02/16/20 Potential to Achieve Goals: Good    Frequency Min 3X/week   Barriers to discharge        Co-evaluation               AM-PAC PT "6 Clicks" Mobility  Outcome Measure Help needed turning from your back to your side while in a flat bed without using bedrails?: None Help needed moving from lying on your back to sitting on the side of a flat bed without using bedrails?: None Help needed moving to and from a bed to a chair (including a wheelchair)?: A Little Help needed standing up from a chair using your arms (e.g., wheelchair or bedside chair)?: None Help needed to walk in hospital room?: A Little Help needed climbing 3-5 steps with a railing? : A Little 6 Click Score: 21    End of Session   Activity Tolerance: Patient tolerated treatment well Patient left: in bed;with call bell/phone within reach;with family/visitor present Nurse Communication: Mobility status PT Visit Diagnosis: Other abnormalities of gait and mobility (R26.89)    Time: 1916-6060 PT Time Calculation (min) (ACUTE ONLY): 16 min   Charges:   PT Evaluation $PT Eval Moderate Complexity: 1 Mod          Ngoc Daughtridge P, PT Acute Rehabilitation Services Pager: [phone] Office: [place] 02/02/2020, 9:23 AM

## 2020-02-03 ENCOUNTER — Encounter (HOSPITAL_COMMUNITY): Payer: Self-pay | Admitting: Internal Medicine

## 2020-02-03 ENCOUNTER — Inpatient Hospital Stay (HOSPITAL_COMMUNITY): Payer: Medicare Other | Admitting: Anesthesiology

## 2020-02-03 ENCOUNTER — Encounter (HOSPITAL_COMMUNITY)
Admission: EM | Disposition: A | Discharge status: Home / Self Care | Payer: Self-pay | Source: Home / Self Care | Attending: Internal Medicine

## 2020-02-03 DIAGNOSIS — D120 Benign neoplasm of cecum: Secondary | ICD-10-CM | POA: Diagnosis not present

## 2020-02-03 DIAGNOSIS — D128 Benign neoplasm of rectum: Secondary | ICD-10-CM | POA: Diagnosis not present

## 2020-02-03 DIAGNOSIS — K921 Melena: Secondary | ICD-10-CM | POA: Diagnosis not present

## 2020-02-03 DIAGNOSIS — K635 Polyp of colon: Secondary | ICD-10-CM | POA: Diagnosis not present

## 2020-02-03 DIAGNOSIS — D123 Benign neoplasm of transverse colon: Secondary | ICD-10-CM | POA: Diagnosis not present

## 2020-02-03 DIAGNOSIS — K91840 Postprocedural hemorrhage and hematoma of a digestive system organ or structure following a digestive system procedure: Secondary | ICD-10-CM | POA: Diagnosis not present

## 2020-02-03 DIAGNOSIS — K625 Hemorrhage of anus and rectum: Secondary | ICD-10-CM | POA: Diagnosis not present

## 2020-02-03 DIAGNOSIS — K922 Gastrointestinal hemorrhage, unspecified: Secondary | ICD-10-CM | POA: Diagnosis not present

## 2020-02-03 HISTORY — PX: HEMOSTASIS CLIP PLACEMENT: SHX6857

## 2020-02-03 HISTORY — PX: COLONOSCOPY WITH PROPOFOL: SHX5780

## 2020-02-03 LAB — CBC WITH DIFFERENTIAL/PLATELET
Abs Immature Granulocytes: 0.07 10*3/uL (ref 0.00–0.07)
Basophils Absolute: 0 10*3/uL (ref 0.0–0.1)
Basophils Relative: 1 %
Eosinophils Absolute: 0.2 10*3/uL (ref 0.0–0.5)
Eosinophils Relative: 3 %
HCT: 24.9 % — ABNORMAL LOW (ref 39.0–52.0)
Hemoglobin: 8.4 g/dL — ABNORMAL LOW (ref 13.0–17.0)
Immature Granulocytes: 1 %
Lymphocytes Relative: 13 %
Lymphs Abs: 0.9 10*3/uL (ref 0.7–4.0)
MCH: 30.3 pg (ref 26.0–34.0)
MCHC: 33.7 g/dL (ref 30.0–36.0)
MCV: 89.9 fL (ref 80.0–100.0)
Monocytes Absolute: 0.6 10*3/uL (ref 0.1–1.0)
Monocytes Relative: 9 %
Neutro Abs: 5.3 10*3/uL (ref 1.7–7.7)
Neutrophils Relative %: 73 %
Platelets: 166 10*3/uL (ref 150–400)
RBC: 2.77 MIL/uL — ABNORMAL LOW (ref 4.22–5.81)
RDW: 15.3 % (ref 11.5–15.5)
WBC: 7.2 10*3/uL (ref 4.0–10.5)
nRBC: 0 % (ref 0.0–0.2)

## 2020-02-03 LAB — GLUCOSE, CAPILLARY
Glucose-Capillary: 134 mg/dL — ABNORMAL HIGH (ref 70–99)
Glucose-Capillary: 137 mg/dL — ABNORMAL HIGH (ref 70–99)
Glucose-Capillary: 148 mg/dL — ABNORMAL HIGH (ref 70–99)
Glucose-Capillary: 153 mg/dL — ABNORMAL HIGH (ref 70–99)
Glucose-Capillary: 163 mg/dL — ABNORMAL HIGH (ref 70–99)
Glucose-Capillary: 166 mg/dL — ABNORMAL HIGH (ref 70–99)

## 2020-02-03 LAB — PREPARE RBC (CROSSMATCH)

## 2020-02-03 LAB — HEMOGLOBIN AND HEMATOCRIT, BLOOD
HCT: 17.9 % — ABNORMAL LOW (ref 39.0–52.0)
Hemoglobin: 5.7 g/dL — CL (ref 13.0–17.0)

## 2020-02-03 SURGERY — COLONOSCOPY WITH PROPOFOL
Anesthesia: Monitor Anesthesia Care

## 2020-02-03 MED ORDER — SODIUM CHLORIDE 0.9% IV SOLUTION: Freq: Once | INTRAVENOUS | Status: AC

## 2020-02-03 MED ORDER — ACETAMINOPHEN 325 MG PO TABS
650.0000 mg | ORAL_TABLET | Freq: Once | ORAL | Status: AC
  Administered 2020-02-03: 650 mg via ORAL
  Filled 2020-02-03: qty 2

## 2020-02-03 MED ORDER — LIDOCAINE HCL (CARDIAC) PF 100 MG/5ML IV SOSY
PREFILLED_SYRINGE | INTRAVENOUS | Status: DC | PRN
  Administered 2020-02-03: 100 mg via INTRAVENOUS

## 2020-02-03 MED ORDER — PROPOFOL 500 MG/50ML IV EMUL
INTRAVENOUS | Status: DC | PRN
  Administered 2020-02-03: 100 ug/kg/min via INTRAVENOUS

## 2020-02-03 MED ORDER — FUROSEMIDE 10 MG/ML IJ SOLN
20.0000 mg | Freq: Once | INTRAMUSCULAR | Status: AC
  Administered 2020-02-03: 20 mg via INTRAVENOUS
  Filled 2020-02-03: qty 2

## 2020-02-03 MED ORDER — PROPOFOL 10 MG/ML IV BOLUS
INTRAVENOUS | Status: DC | PRN
  Administered 2020-02-03 (×4): 20 mg via INTRAVENOUS

## 2020-02-03 MED ORDER — DIPHENHYDRAMINE HCL 50 MG/ML IJ SOLN
25.0000 mg | Freq: Once | INTRAMUSCULAR | Status: AC
  Administered 2020-02-03: 25 mg via INTRAVENOUS
  Filled 2020-02-03: qty 1

## 2020-02-03 MED ORDER — PHENYLEPHRINE HCL (PRESSORS) 10 MG/ML IV SOLN
INTRAVENOUS | Status: DC | PRN
  Administered 2020-02-03 (×2): 80 ug via INTRAVENOUS

## 2020-02-03 MED ORDER — SODIUM CHLORIDE 0.9 % IV SOLN: INTRAVENOUS | Status: DC

## 2020-02-03 SURGICAL SUPPLY — 22 items

## 2020-02-03 NOTE — Brief Op Note (Signed)
01/31/2020 - 02/03/2020  4:52 PM  PATIENT:  Jonathan Tucker  69 y.o. male  PRE-OPERATIVE DIAGNOSIS:  lower GI bleed  POST-OPERATIVE DIAGNOSIS:  2 clips placed polypectomy site (near previous clip), 1 clip placed ulcer with visible vessel in transverse colon, 1-2 polyps in transverse not removed, 2 clip rectosigmoid polyp, diverticula  PROCEDURE:  Procedure(s): COLONOSCOPY WITH PROPOFOL (N/A) HEMOSTASIS CLIP PLACEMENT  SURGEON:  Surgeon(s) and Role:    Ronnette Juniper, MD - Primary  PHYSICIAN ASSISTANT:   ASSISTANTS: Lillia Mountain, Tech  ANESTHESIA:   MAC  EBL:  None  BLOOD ADMINISTERED:none  DRAINS: none   LOCAL MEDICATIONS USED:  NONE  SPECIMEN:  No Specimen  DISPOSITION OF SPECIMEN:  N/A  COUNTS:  YES  TOURNIQUET:  * No tourniquets in log *  DICTATION: .Dragon Dictation  PLAN OF CARE: Admit to inpatient   PATIENT DISPOSITION:  PACU - hemodynamically stable.   Delay start of Pharmacological VTE agent (>24hrs) due to surgical blood loss or risk of bleeding: not applicable

## 2020-02-03 NOTE — Anesthesia Postprocedure Evaluation (Signed)
Anesthesia Post Note  Patient: Jonathan Tucker  Procedure(s) Performed: COLONOSCOPY WITH PROPOFOL (N/A ) HEMOSTASIS CLIP PLACEMENT     Patient location during evaluation: PACU Anesthesia Type: MAC Level of consciousness: awake and alert and oriented Pain management: pain level controlled Vital Signs Assessment: post-procedure vital signs reviewed and stable Respiratory status: spontaneous breathing, nonlabored ventilation, respiratory function stable and patient connected to nasal cannula oxygen Cardiovascular status: stable and blood pressure returned to baseline Postop Assessment: no apparent nausea or vomiting Anesthetic complications: no   No complications documented.  Last Vitals:  Vitals:   02/03/20 1715 02/03/20 1744  BP: 132/66 128/64  Pulse: 81 82  Resp: 17 18  Temp: 36.6 C 36.9 C  SpO2: 97% 98%    Last Pain:  Vitals:   02/03/20 1744  TempSrc: Oral  PainSc:                  Deserie Dirks A.

## 2020-02-03 NOTE — Progress Notes (Signed)
The patient started having active bleeding again last night.  Hemoglobin and hematocrit dropped again.  Discussed with patient and his wife.  This is a post polypectomy bleed.  Nuclear medicine scan on the 18th showed activity in the right colon near hepatic flexure.  CT angiogram showed activity of bleeding in the area of the cecum.  He underwent angiography with embolization also on the 18th.  He had a colonoscopy the night that he came in with GI bleeding by Dr. Michail Sermon.  At this point I think we are going to have to do another colonoscopy and this will be scheduled for today.  Continue blood transfusion.

## 2020-02-03 NOTE — Transfer of Care (Signed)
Immediate Anesthesia Transfer of Care Note  Patient: Jonathan Tucker  Procedure(s) Performed: COLONOSCOPY WITH PROPOFOL (N/A ) HEMOSTASIS CLIP PLACEMENT  Patient Location: PACU  Anesthesia Type:MAC  Level of Consciousness: drowsy  Airway & Oxygen Therapy: Patient Spontanous Breathing and Patient connected to nasal cannula oxygen  Post-op Assessment: Report given to RN, Post -op Vital signs reviewed and stable and Patient moving all extremities  Post vital signs: Reviewed and stable  Last Vitals:  Vitals Value Taken Time  BP 123/71 02/03/20 1645  Temp    Pulse 86 02/03/20 1646  Resp 18 02/03/20 1646  SpO2 100 % 02/03/20 1646  Vitals shown include unvalidated device data.  Last Pain:  Vitals:   02/03/20 1511  TempSrc: Oral  PainSc: 5       Patients Stated Pain Goal: 2 (01/28/47 1856)  Complications: No complications documented.

## 2020-02-03 NOTE — Progress Notes (Addendum)
Pt excreted 367mL of bloody stool. BP 130/80. HR 95. Pt states that he feels weak, but otherwise okay. Pt currently has 1st unit of blood infusing. MD paged. Referred to GI. [facility] MD paged, awaiting for callback.

## 2020-02-03 NOTE — Progress Notes (Signed)
Referring Physician(s): * No referring provider recorded for this case *  Supervising Physician: Jacqulynn Cadet  Patient Status:  [facility] - In-pt  Chief Complaint: GI Bleed  Subjective: Patient s/p angiogram with peripheral micro coil embolization of 2 right colic branches to the cecum.  Continues to have small bloody bowel movements throughout the day which progressed to "oozing" overnight, and ultimate large bloody bowel movement at 430 this AM.  HgB down to 5.9.  Patient seen at bedside this AM.  Complains that his stomach is "gurgly" and crampy.  States he may have had another bloody bowel movement since this AM, however admits that episodes are running together especially events overnight.  Wife at bedside states Dr. Penelope Coop in this AM and planning to take for colonoscopy.   Allergies: Codeine, Flexeril [cyclobenzaprine], and Tape  Medications: Prior to Admission medications   Medication Sig Start Date End Date Taking? Authorizing Provider  aspirin EC 325 MG tablet Take 325 mg by mouth every evening.   Yes [provider]  calcium carbonate (TUMS - DOSED IN MG ELEMENTAL CALCIUM) 500 MG chewable tablet Chew 1 tablet by mouth daily as needed for indigestion or heartburn.   Yes [provider]  colesevelam (WELCHOL) 625 MG tablet Take 1,250 mg by mouth 2 (two) times daily with a meal.   Yes [provider]  fenofibrate (TRICOR) 145 MG tablet Take 145 mg by mouth every evening.   Yes [provider]  hydrochlorothiazide (HYDRODIURIL) 25 MG tablet Take 25 mg by mouth daily.   Yes [provider]  Insulin Degludec (TRESIBA) 100 UNIT/ML SOLN Inject 36 Units into the skin every evening.   Yes [provider]  metFORMIN (GLUCOPHAGE) 500 MG tablet 1 after the largest meal 03/27/16  Yes Hendricks Limes, MD  metoprolol tartrate (LOPRESSOR) 25 MG tablet Take 1 tablet (25 mg total) by mouth 2 (two) times daily. 03/27/16  Yes Hendricks Limes, MD  naproxen sodium (ANAPROX) 220 MG tablet Take 440 mg by mouth daily as needed (pain).   Yes [provider]  ramipril (ALTACE) 10 MG capsule Take 10 mg by mouth every evening.   Yes [provider]  rosuvastatin (CRESTOR) 5 MG tablet Take 5 mg by mouth every evening.   Yes [provider]  TRULICITY 1.5 QM/0.8QP SOPN Inject 1.5 mg into the skin once a week.  08/15/19  Yes [provider]  senna-docusate (SENOKOT-S) 8.6-50 MG tablet Take 1 tablet by mouth at bedtime as needed for mild constipation. Patient not taking: Reported on 01/31/2020 03/25/16   Dhungel, Flonnie Overman, MD  sulfamethoxazole-trimethoprim (BACTRIM DS,SEPTRA DS) 800-160 MG tablet Take 1 tablet by mouth every 12 (twelve) hours. Patient not taking: Reported on 01/31/2020 03/25/16   Dhungel, Flonnie Overman, MD     Vital Signs: BP (!) 144/79   Pulse 86   Temp 98.4 F (36.9 C) (Oral)   Resp 18   Ht 5\' 11"  (1.803 m)   Wt 242 lb 4.6 oz (109.9 kg)   SpO2 100%   BMI 33.79 kg/m   Physical Exam  NAD, alert Abdomen: soft, active bowel sounds, lower abdominal tenderness.  Groin: soft, intact. No oozing, bleeding, or evidence of hematoma.   Imaging: NM GI Blood Loss  Result Date: 02/01/2020 CLINICAL DATA:  GI bleed. History of colonoscopy yesterday with 8 polyps removed. EXAM: NUCLEAR MEDICINE GASTROINTESTINAL BLEEDING SCAN TECHNIQUE: Sequential abdominal images were obtained following intravenous administration of Tc-16m labeled red blood cells. RADIOPHARMACEUTICALS:  26.5 mCi Tc-62m pertechnetate in-vitro labeled red cells. COMPARISON:  CT scan 01/31/2020 FINDINGS: At 10 minutes there is a focus of activity noted in the right abdomen in the region of the hepatic flexure. This shows progressive increased intensity. Activity then moves across the transverse colon to the splenic flexure and down into the descending colon. Findings consistent with a bleeding source in the right colon near the hepatic  flexure. IMPRESSION: Findings consistent with a bleeding source in the right colon near the hepatic flexure. Electronically Signed   By: Marijo Sanes M.D.   On: 02/01/2020 20:57   CT ABDOMEN PELVIS W CONTRAST  Result Date: 01/31/2020 CLINICAL DATA:  Abdominal distension and rectal bleeding after colonoscopy today EXAM: CT ABDOMEN AND PELVIS WITH CONTRAST TECHNIQUE: Multidetector CT imaging of the abdomen and pelvis was performed using the standard protocol following bolus administration of intravenous contrast. CONTRAST:  129mL OMNIPAQUE IOHEXOL 300 MG/ML  SOLN COMPARISON:  01/31/2020 FINDINGS: Lower chest: No acute pleural or parenchymal lung disease. Hepatobiliary: No focal liver abnormality is seen. No gallstones, gallbladder wall thickening, or biliary dilatation. Pancreas: Unremarkable. No pancreatic ductal dilatation or surrounding inflammatory changes. Spleen: Normal in size without focal abnormality. Adrenals/Urinary Tract: Adrenal glands are unremarkable. Kidneys are normal, without renal calculi, focal lesion, or hydronephrosis. Bladder is unremarkable. Stomach/Bowel: No bowel obstruction or ileus. Normal appendix right lower quadrant. No bowel wall thickening or inflammatory change. Vascular/Lymphatic: Aortic atherosclerosis. No enlarged abdominal or pelvic lymph nodes. Reproductive: Prostate is unremarkable. Other: No free fluid or free intra-abdominal gas. No abdominal wall hernia. Musculoskeletal: No acute or destructive bony lesion. Reconstructed images demonstrate no additional findings. IMPRESSION: 1. No evidence of complication after colonoscopy. No bowel perforation. 2. No acute intra-abdominal or intrapelvic process. 3.  Aortic Atherosclerosis (ICD10-I70.0). Electronically Signed   By: Randa Ngo M.D.   On: 01/31/2020 19:26   IR Angiogram Visceral Selective  Result Date: 02/02/2020 INDICATION: Acute lower GI bleeding, positive nuclear medicine blood scan and positive abdominal pelvis  CTA demonstrating cecal bleeding at a resected polyps site where there is an endo clip. EXAM: Ultrasound guidance for vascular access Celiac and SMA catheterizations and angiograms Three additional peripheral right ileocolic and right colic micro catheterization and angiograms Successful micro coil embolization of 2 peripheral branches of the right colic vasculature (to the cecal angio dysplasia distribution) MEDICATIONS: 1% lidocaine local. The antibiotic was administered within 1 hour of the procedure ANESTHESIA/SEDATION: Moderate (conscious) sedation was employed during this procedure. A total of Versed 1.5 mg and Fentanyl 575 mcg was administered intravenously. Moderate Sedation Time: 60 minutes. The patient's level of consciousness and vital signs were monitored continuously by radiology nursing throughout the procedure under my direct supervision. CONTRAST:  85 cc omni 300 FLUOROSCOPY TIME:  Fluoroscopy Time: 11 minutes 54 seconds (1,835 mGy). COMPLICATIONS: None immediate. PROCEDURE: Informed consent was obtained from the patient following explanation of the procedure, risks, benefits and alternatives. The patient understands, agrees and consents for the procedure. All questions were addressed. A time out was performed prior to the initiation of the procedure. Maximal barrier sterile technique utilized including caps, mask, sterile gowns, sterile gloves, large sterile drape, hand hygiene, and Betadine prep. Under sterile conditions and local anesthesia, ultrasound micropuncture access performed of the right common femoral artery. Images obtained for documentation of the patent right common femoral artery. Five French sheath inserted a Bentson guidewire. C2 catheter utilized to initially select the celiac origin. Celiac angiogram performed. Celiac: The celiac origin is widely patent including its  branches. Specifically the splenic, left gastric, hepatic and gastroduodenal vasculature all patent. No active  bleeding in the celiac territory. Catheter was retracted and utilized to select the SMA. Several SMA angiograms were performed. Initial SMA: Atherosclerotic changes noted but the SMA is patent. The jejunal and colic branches are patent throughout the central mesentery. Limited assessment of the peripheral right lower quadrant colic branches. For better visualization, a Renegade STC catheter and a single angle GT glide wire were utilized to advance the access peripherally initially into the far distal aspect of the distal SMA. Peripheral distal SMA angiogram performed. Peripheral SMA: Peripheral SMA branches are patent supplying small bowel in the right lower quadrant. Normal vascularity to the small bowel in this region. No evidence of active contrast extravasation or bleeding. Microcatheter was retracted and utilized to select the ileal colic common trunk. Additional peripheral angiography report. Ileocolic common trunk: Right ileocolic trunk branches are patent and supply the cecal area. 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 Renegade STC catheter was advanced over a double angled glidewire more peripherally into a right colic branch. Peripheral right colic angiogram performed. 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 Peripheral right colic branch micro coil embolization. 2 mm interlock coils ranging in length from 4-6 cm were successfully deployed in the peripheral right colic branch. Following embolization, there is less visualization of the hypervascularity in the cecal area with preservation of the marginal branch arterial supply to the colon. Microcatheter was retracted into the main right colic branch. Repeat angiogram again confirms additional  branches supplying the tortuous hypervascular cecal area with the early draining vein remaining. 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 Microcatheter was advanced into a inferior right colic peripheral branch directed toward the endo clip site. Second peripheral right colic branch angiogram performed. Second inferior peripheral right colic branch: This right colic branch also demonstrates arterial supply to the tortuous hypervascular area of the cecum with an early draining vein again compatible with angiodysplasia. Second inferior peripheral right colic branch micro coil embolization: Additional 2 mm x 6 cm micro coil was deployed within this second inferior right colic branch. Microcatheter again was retracted more proximal into the main right colic branch. Repeat right colic angiogram performed. Right colic angiogram: Following embolization of the 2 peripheral right colic branches, there is less arterial supply to the hypervascular cecal abnormality with the early draining vein less apparent. There is preserved marginal branch arterial supply to the cecum. At this point, the access was removed. Hemostasis obtained at the right common femoral artery access site with the ExoSeal device. No immediate complication. Patient tolerated the procedure well. 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 Following embolization there is less arterial vascular supply to the cecal angio dysplasia with preserved marginal branch supply to the colon. Electronically Signed   By: Jerilynn Mages.  Shick M.D.   On: 02/02/2020 13:35   IR Angiogram Visceral Selective  Result Date: 02/02/2020 INDICATION: Acute lower GI bleeding, positive nuclear medicine blood scan and positive abdominal pelvis CTA demonstrating cecal bleeding at a resected polyps site where there is an endo clip. EXAM: Ultrasound  guidance for vascular access Celiac and SMA catheterizations and angiograms Three additional peripheral right ileocolic  and right colic micro catheterization and angiograms Successful micro coil embolization of 2 peripheral branches of the right colic vasculature (to the cecal angio dysplasia distribution) MEDICATIONS: 1% lidocaine local. The antibiotic was administered within 1 hour of the procedure ANESTHESIA/SEDATION: Moderate (conscious) sedation was employed during this procedure. A total of Versed 1.5 mg and Fentanyl 575 mcg was administered intravenously. Moderate Sedation Time: 60 minutes. The patient's level of consciousness and vital signs were monitored continuously by radiology nursing throughout the procedure under my direct supervision. CONTRAST:  85 cc omni 300 FLUOROSCOPY TIME:  Fluoroscopy Time: 11 minutes 54 seconds (1,835 mGy). COMPLICATIONS: None immediate. PROCEDURE: Informed consent was obtained from the patient following explanation of the procedure, risks, benefits and alternatives. The patient understands, agrees and consents for the procedure. All questions were addressed. A time out was performed prior to the initiation of the procedure. Maximal barrier sterile technique utilized including caps, mask, sterile gowns, sterile gloves, large sterile drape, hand hygiene, and Betadine prep. Under sterile conditions and local anesthesia, ultrasound micropuncture access performed of the right common femoral artery. Images obtained for documentation of the patent right common femoral artery. Five French sheath inserted a Bentson guidewire. C2 catheter utilized to initially select the celiac origin. Celiac angiogram performed. Celiac: The celiac origin is widely patent including its branches. Specifically the splenic, left gastric, hepatic and gastroduodenal vasculature all patent. No active bleeding in the celiac territory. Catheter was retracted and utilized to select the SMA. Several SMA  angiograms were performed. Initial SMA: Atherosclerotic changes noted but the SMA is patent. The jejunal and colic branches are patent throughout the central mesentery. Limited assessment of the peripheral right lower quadrant colic branches. For better visualization, a Renegade STC catheter and a single angle GT glide wire were utilized to advance the access peripherally initially into the far distal aspect of the distal SMA. Peripheral distal SMA angiogram performed. Peripheral SMA: Peripheral SMA branches are patent supplying small bowel in the right lower quadrant. Normal vascularity to the small bowel in this region. No evidence of active contrast extravasation or bleeding. Microcatheter was retracted and utilized to select the ileal colic common trunk. Additional peripheral angiography report. Ileocolic common trunk: Right ileocolic trunk branches are patent and supply the cecal area. 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 Renegade STC catheter was advanced over a double angled glidewire more peripherally into a right colic branch. Peripheral right colic angiogram performed. 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 Peripheral right colic branch micro coil embolization. 2 mm interlock coils ranging in length from 4-6 cm were successfully deployed in the peripheral right colic branch. Following embolization, there is less visualization of the hypervascularity in the cecal area with preservation of the marginal branch arterial supply to the colon. Microcatheter was retracted into the main right colic branch. Repeat angiogram again confirms additional branches supplying the tortuous hypervascular cecal area with the early draining vein remaining.  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 Microcatheter was advanced into a inferior right colic peripheral branch directed toward the endo clip site. Second peripheral right colic branch angiogram performed. Second inferior peripheral right colic branch: This right colic branch also demonstrates arterial supply to the tortuous hypervascular area of the cecum with an early draining vein again compatible with angiodysplasia. Second inferior  peripheral right colic branch micro coil embolization: Additional 2 mm x 6 cm micro coil was deployed within this second inferior right colic branch. Microcatheter again was retracted more proximal into the main right colic branch. Repeat right colic angiogram performed. Right colic angiogram: Following embolization of the 2 peripheral right colic branches, there is less arterial supply to the hypervascular cecal abnormality with the early draining vein less apparent. There is preserved marginal branch arterial supply to the cecum. At this point, the access was removed. Hemostasis obtained at the right common femoral artery access site with the ExoSeal device. No immediate complication. Patient tolerated the procedure well. 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 Following embolization there is less arterial vascular supply to the cecal angio dysplasia with preserved marginal branch supply to the colon. Electronically Signed   By: Jerilynn Mages.  Shick M.D.   On: 02/02/2020 13:35   IR Angiogram Selective Each Additional Vessel  Result Date: 02/02/2020 INDICATION: Acute lower GI bleeding, positive nuclear medicine blood scan and positive abdominal pelvis CTA demonstrating cecal bleeding at a resected polyps site where there is an endo clip. EXAM: Ultrasound guidance for vascular access Celiac and SMA catheterizations and angiograms Three  additional peripheral right ileocolic and right colic micro catheterization and angiograms Successful micro coil embolization of 2 peripheral branches of the right colic vasculature (to the cecal angio dysplasia distribution) MEDICATIONS: 1% lidocaine local. The antibiotic was administered within 1 hour of the procedure ANESTHESIA/SEDATION: Moderate (conscious) sedation was employed during this procedure. A total of Versed 1.5 mg and Fentanyl 575 mcg was administered intravenously. Moderate Sedation Time: 60 minutes. The patient's level of consciousness and vital signs were monitored continuously by radiology nursing throughout the procedure under my direct supervision. CONTRAST:  85 cc omni 300 FLUOROSCOPY TIME:  Fluoroscopy Time: 11 minutes 54 seconds (1,835 mGy). COMPLICATIONS: None immediate. PROCEDURE: Informed consent was obtained from the patient following explanation of the procedure, risks, benefits and alternatives. The patient understands, agrees and consents for the procedure. All questions were addressed. A time out was performed prior to the initiation of the procedure. Maximal barrier sterile technique utilized including caps, mask, sterile gowns, sterile gloves, large sterile drape, hand hygiene, and Betadine prep. Under sterile conditions and local anesthesia, ultrasound micropuncture access performed of the right common femoral artery. Images obtained for documentation of the patent right common femoral artery. Five French sheath inserted a Bentson guidewire. C2 catheter utilized to initially select the celiac origin. Celiac angiogram performed. Celiac: The celiac origin is widely patent including its branches. Specifically the splenic, left gastric, hepatic and gastroduodenal vasculature all patent. No active bleeding in the celiac territory. Catheter was retracted and utilized to select the SMA. Several SMA angiograms were performed. Initial SMA: Atherosclerotic changes noted but the SMA is  patent. The jejunal and colic branches are patent throughout the central mesentery. Limited assessment of the peripheral right lower quadrant colic branches. For better visualization, a Renegade STC catheter and a single angle GT glide wire were utilized to advance the access peripherally initially into the far distal aspect of the distal SMA. Peripheral distal SMA angiogram performed. Peripheral SMA: Peripheral SMA branches are patent supplying small bowel in the right lower quadrant. Normal vascularity to the small bowel in this region. No evidence of active contrast extravasation or bleeding. Microcatheter was retracted and utilized to select the ileal colic common trunk. Additional peripheral angiography  report. Ileocolic common trunk: Right ileocolic trunk branches are patent and supply the cecal area. 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 Renegade STC catheter was advanced over a double angled glidewire more peripherally into a right colic branch. Peripheral right colic angiogram performed. 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 Peripheral right colic branch micro coil embolization. 2 mm interlock coils ranging in length from 4-6 cm were successfully deployed in the peripheral right colic branch. Following embolization, there is less visualization of the hypervascularity in the cecal area with preservation of the marginal branch arterial supply to the colon. Microcatheter was retracted into the main right colic branch. Repeat angiogram again confirms additional branches supplying the tortuous hypervascular cecal area with the early draining vein remaining. 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 Microcatheter was advanced into a  inferior right colic peripheral branch directed toward the endo clip site. Second peripheral right colic branch angiogram performed. Second inferior peripheral right colic branch: This right colic branch also demonstrates arterial supply to the tortuous hypervascular area of the cecum with an early draining vein again compatible with angiodysplasia. Second inferior peripheral right colic branch micro coil embolization: Additional 2 mm x 6 cm micro coil was deployed within this second inferior right colic branch. Microcatheter again was retracted more proximal into the main right colic branch. Repeat right colic angiogram performed. Right colic angiogram: Following embolization of the 2 peripheral right colic branches, there is less arterial supply to the hypervascular cecal abnormality with the early draining vein less apparent. There is preserved marginal branch arterial supply to the cecum. At this point, the access was removed. Hemostasis obtained at the right common femoral artery access site with the ExoSeal device. No immediate complication. Patient tolerated the procedure well. 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 Following embolization there is less arterial vascular supply to the cecal angio dysplasia with preserved marginal branch supply to the colon. Electronically Signed   By: Jerilynn Mages.  Shick M.D.   On: 02/02/2020 13:35   IR Angiogram Selective Each Additional Vessel  Result Date: 02/02/2020 INDICATION: Acute lower GI bleeding, positive nuclear medicine blood scan and positive abdominal pelvis CTA demonstrating cecal bleeding at a resected polyps site where there is an endo clip. EXAM: Ultrasound guidance for vascular access Celiac and SMA catheterizations and angiograms Three additional peripheral right ileocolic and right colic micro catheterization and angiograms  Successful micro coil embolization of 2 peripheral branches of the right colic vasculature (to the cecal angio dysplasia distribution) MEDICATIONS: 1% lidocaine local. The antibiotic was administered within 1 hour of the procedure ANESTHESIA/SEDATION: Moderate (conscious) sedation was employed during this procedure. A total of Versed 1.5 mg and Fentanyl 575 mcg was administered intravenously. Moderate Sedation Time: 60 minutes. The patient's level of consciousness and vital signs were monitored continuously by radiology nursing throughout the procedure under my direct supervision. CONTRAST:  85 cc omni 300 FLUOROSCOPY TIME:  Fluoroscopy Time: 11 minutes 54 seconds (1,835 mGy). COMPLICATIONS: None immediate. PROCEDURE: Informed consent was obtained from the patient following explanation of the procedure, risks, benefits and alternatives. The patient understands, agrees and consents for the procedure. All questions were addressed. A time out was performed prior to the  initiation of the procedure. Maximal barrier sterile technique utilized including caps, mask, sterile gowns, sterile gloves, large sterile drape, hand hygiene, and Betadine prep. Under sterile conditions and local anesthesia, ultrasound micropuncture access performed of the right common femoral artery. Images obtained for documentation of the patent right common femoral artery. Five French sheath inserted a Bentson guidewire. C2 catheter utilized to initially select the celiac origin. Celiac angiogram performed. Celiac: The celiac origin is widely patent including its branches. Specifically the splenic, left gastric, hepatic and gastroduodenal vasculature all patent. No active bleeding in the celiac territory. Catheter was retracted and utilized to select the SMA. Several SMA angiograms were performed. Initial SMA: Atherosclerotic changes noted but the SMA is patent. The jejunal and colic branches are patent throughout the central mesentery. Limited  assessment of the peripheral right lower quadrant colic branches. For better visualization, a Renegade STC catheter and a single angle GT glide wire were utilized to advance the access peripherally initially into the far distal aspect of the distal SMA. Peripheral distal SMA angiogram performed. Peripheral SMA: Peripheral SMA branches are patent supplying small bowel in the right lower quadrant. Normal vascularity to the small bowel in this region. No evidence of active contrast extravasation or bleeding. Microcatheter was retracted and utilized to select the ileal colic common trunk. Additional peripheral angiography report. Ileocolic common trunk: Right ileocolic trunk branches are patent and supply the cecal area. 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 Renegade STC catheter was advanced over a double angled glidewire more peripherally into a right colic branch. Peripheral right colic angiogram performed. 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 Peripheral right colic branch micro coil embolization. 2 mm interlock coils ranging in length from 4-6 cm were successfully deployed in the peripheral right colic branch. Following embolization, there is less visualization of the hypervascularity in the cecal area with preservation of the marginal branch arterial supply to the colon. Microcatheter was retracted into the main right colic branch. Repeat angiogram again confirms additional branches supplying the tortuous hypervascular cecal area with the early draining vein remaining. 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 Microcatheter was advanced into a inferior right colic peripheral branch directed toward the endo clip site. Second  peripheral right colic branch angiogram performed. Second inferior peripheral right colic branch: This right colic branch also demonstrates arterial supply to the tortuous hypervascular area of the cecum with an early draining vein again compatible with angiodysplasia. Second inferior peripheral right colic branch micro coil embolization: Additional 2 mm x 6 cm micro coil was deployed within this second inferior right colic branch. Microcatheter again was retracted more proximal into the main right colic branch. Repeat right colic angiogram performed. Right colic angiogram: Following embolization of the 2 peripheral right colic branches, there is less arterial supply to the hypervascular cecal abnormality with the early draining vein less apparent. There is preserved marginal branch arterial supply to the cecum. At this point, the access was removed. Hemostasis obtained at the right common femoral artery access site with the ExoSeal device. No immediate complication. Patient tolerated the procedure well. 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 Following embolization there is less arterial vascular supply to the cecal angio dysplasia  with preserved marginal branch supply to the colon. Electronically Signed   By: Jerilynn Mages.  Shick M.D.   On: 02/02/2020 13:35   IR Angiogram Selective Each Additional Vessel  Result Date: 02/02/2020 INDICATION: Acute lower GI bleeding, positive nuclear medicine blood scan and positive abdominal pelvis CTA demonstrating cecal bleeding at a resected polyps site where there is an endo clip. EXAM: Ultrasound guidance for vascular access Celiac and SMA catheterizations and angiograms Three additional peripheral right ileocolic and right colic micro catheterization and angiograms Successful micro coil embolization of 2 peripheral branches of the right colic  vasculature (to the cecal angio dysplasia distribution) MEDICATIONS: 1% lidocaine local. The antibiotic was administered within 1 hour of the procedure ANESTHESIA/SEDATION: Moderate (conscious) sedation was employed during this procedure. A total of Versed 1.5 mg and Fentanyl 575 mcg was administered intravenously. Moderate Sedation Time: 60 minutes. The patient's level of consciousness and vital signs were monitored continuously by radiology nursing throughout the procedure under my direct supervision. CONTRAST:  85 cc omni 300 FLUOROSCOPY TIME:  Fluoroscopy Time: 11 minutes 54 seconds (1,835 mGy). COMPLICATIONS: None immediate. PROCEDURE: Informed consent was obtained from the patient following explanation of the procedure, risks, benefits and alternatives. The patient understands, agrees and consents for the procedure. All questions were addressed. A time out was performed prior to the initiation of the procedure. Maximal barrier sterile technique utilized including caps, mask, sterile gowns, sterile gloves, large sterile drape, hand hygiene, and Betadine prep. Under sterile conditions and local anesthesia, ultrasound micropuncture access performed of the right common femoral artery. Images obtained for documentation of the patent right common femoral artery. Five French sheath inserted a Bentson guidewire. C2 catheter utilized to initially select the celiac origin. Celiac angiogram performed. Celiac: The celiac origin is widely patent including its branches. Specifically the splenic, left gastric, hepatic and gastroduodenal vasculature all patent. No active bleeding in the celiac territory. Catheter was retracted and utilized to select the SMA. Several SMA angiograms were performed. Initial SMA: Atherosclerotic changes noted but the SMA is patent. The jejunal and colic branches are patent throughout the central mesentery. Limited assessment of the peripheral right lower quadrant colic branches. For better  visualization, a Renegade STC catheter and a single angle GT glide wire were utilized to advance the access peripherally initially into the far distal aspect of the distal SMA. Peripheral distal SMA angiogram performed. Peripheral SMA: Peripheral SMA branches are patent supplying small bowel in the right lower quadrant. Normal vascularity to the small bowel in this region. No evidence of active contrast extravasation or bleeding. Microcatheter was retracted and utilized to select the ileal colic common trunk. Additional peripheral angiography report. Ileocolic common trunk: Right ileocolic trunk branches are patent and supply the cecal area. 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 Renegade STC catheter was advanced over a double angled glidewire more peripherally into a right colic branch. Peripheral right colic angiogram performed. 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 Peripheral right colic branch micro coil embolization. 2 mm interlock coils ranging in length from 4-6 cm were successfully deployed in the peripheral right colic branch. Following embolization, there is less visualization of the hypervascularity in the cecal area with preservation of the marginal branch arterial supply to the colon. Microcatheter was retracted into the main right colic branch. Repeat  angiogram again confirms additional branches supplying the tortuous hypervascular cecal area with the early draining vein remaining. 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 Microcatheter was advanced into a inferior right colic peripheral branch directed toward the endo clip site. Second peripheral right colic branch angiogram performed. Second inferior peripheral right  colic branch: This right colic branch also demonstrates arterial supply to the tortuous hypervascular area of the cecum with an early draining vein again compatible with angiodysplasia. Second inferior peripheral right colic branch micro coil embolization: Additional 2 mm x 6 cm micro coil was deployed within this second inferior right colic branch. Microcatheter again was retracted more proximal into the main right colic branch. Repeat right colic angiogram performed. Right colic angiogram: Following embolization of the 2 peripheral right colic branches, there is less arterial supply to the hypervascular cecal abnormality with the early draining vein less apparent. There is preserved marginal branch arterial supply to the cecum. At this point, the access was removed. Hemostasis obtained at the right common femoral artery access site with the ExoSeal device. No immediate complication. Patient tolerated the procedure well. 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 Following embolization there is less arterial vascular supply to the cecal angio dysplasia with preserved marginal branch supply to the colon. Electronically Signed   By: Jerilynn Mages.  Shick M.D.   On: 02/02/2020 13:35   IR US Guide Vasc Access Right  Result Date: 02/02/2020 INDICATION: Acute lower GI bleeding, positive nuclear medicine blood scan and positive abdominal pelvis CTA demonstrating cecal bleeding at a resected polyps site where there is an endo clip. EXAM: Ultrasound guidance for vascular access Celiac and SMA catheterizations and angiograms Three additional peripheral right ileocolic and right colic micro catheterization and angiograms Successful micro coil embolization of 2 peripheral branches of the right colic vasculature (to the cecal angio dysplasia distribution) MEDICATIONS: 1% lidocaine local. The antibiotic  was administered within 1 hour of the procedure ANESTHESIA/SEDATION: Moderate (conscious) sedation was employed during this procedure. A total of Versed 1.5 mg and Fentanyl 575 mcg was administered intravenously. Moderate Sedation Time: 60 minutes. The patient's level of consciousness and vital signs were monitored continuously by radiology nursing throughout the procedure under my direct supervision. CONTRAST:  85 cc omni 300 FLUOROSCOPY TIME:  Fluoroscopy Time: 11 minutes 54 seconds (1,835 mGy). COMPLICATIONS: None immediate. PROCEDURE: Informed consent was obtained from the patient following explanation of the procedure, risks, benefits and alternatives. The patient understands, agrees and consents for the procedure. All questions were addressed. A time out was performed prior to the initiation of the procedure. Maximal barrier sterile technique utilized including caps, mask, sterile gowns, sterile gloves, large sterile drape, hand hygiene, and Betadine prep. Under sterile conditions and local anesthesia, ultrasound micropuncture access performed of the right common femoral artery. Images obtained for documentation of the patent right common femoral artery. Five French sheath inserted a Bentson guidewire. C2 catheter utilized to initially select the celiac origin. Celiac angiogram performed. Celiac: The celiac origin is widely patent including its branches. Specifically the splenic, left gastric, hepatic and gastroduodenal vasculature all patent. No active bleeding in the celiac territory. Catheter was retracted and utilized to select the SMA. Several SMA angiograms were performed. Initial SMA: Atherosclerotic changes noted but the SMA is patent. The jejunal and colic branches are patent throughout the central mesentery. Limited assessment of the peripheral right lower quadrant colic branches. For better visualization,  a Renegade STC catheter and a single angle GT glide wire were utilized to advance the access  peripherally initially into the far distal aspect of the distal SMA. Peripheral distal SMA angiogram performed. Peripheral SMA: Peripheral SMA branches are patent supplying small bowel in the right lower quadrant. Normal vascularity to the small bowel in this region. No evidence of active contrast extravasation or bleeding. Microcatheter was retracted and utilized to select the ileal colic common trunk. Additional peripheral angiography report. Ileocolic common trunk: Right ileocolic trunk branches are patent and supply the cecal area. 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 Renegade STC catheter was advanced over a double angled glidewire more peripherally into a right colic branch. Peripheral right colic angiogram performed. 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 Peripheral right colic branch micro coil embolization. 2 mm interlock coils ranging in length from 4-6 cm were successfully deployed in the peripheral right colic branch. Following embolization, there is less visualization of the hypervascularity in the cecal area with preservation of the marginal branch arterial supply to the colon. Microcatheter was retracted into the main right colic branch. Repeat angiogram again confirms additional branches supplying the tortuous hypervascular cecal area with the early draining vein remaining. 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 Microcatheter was advanced into a inferior right colic peripheral branch directed toward the endo clip site. Second peripheral right colic branch angiogram performed. Second inferior peripheral right colic branch: This right colic branch also demonstrates arterial supply to the tortuous hypervascular area of  the cecum with an early draining vein again compatible with angiodysplasia. Second inferior peripheral right colic branch micro coil embolization: Additional 2 mm x 6 cm micro coil was deployed within this second inferior right colic branch. Microcatheter again was retracted more proximal into the main right colic branch. Repeat right colic angiogram performed. Right colic angiogram: Following embolization of the 2 peripheral right colic branches, there is less arterial supply to the hypervascular cecal abnormality with the early draining vein less apparent. There is preserved marginal branch arterial supply to the cecum. At this point, the access was removed. Hemostasis obtained at the right common femoral artery access site with the ExoSeal device. No immediate complication. Patient tolerated the procedure well. 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 Following embolization there is less arterial vascular supply to the cecal angio dysplasia with preserved marginal branch supply to the colon. Electronically Signed   By: Jerilynn Mages.  Shick M.D.   On: 02/02/2020 13:35   DG Chest Port 1 View  Result Date: 01/31/2020 CLINICAL DATA:  Abdominal pain and rectal bleeding after colonoscopy EXAM: PORTABLE CHEST 1 VIEW COMPARISON:  03/16/2016 FINDINGS: 2 frontal views of the chest demonstrate an unremarkable cardiac silhouette. No airspace disease, effusion, or pneumothorax. No evidence of free gas in the greater peritoneal sac. IMPRESSION: 1. No acute intrathoracic process. Electronically Signed   By: Randa Ngo M.D.   On: 01/31/2020 19:43   DG Abd Portable 1 View  Result Date: 01/31/2020 CLINICAL DATA:  Abdominal pain and rectal bleeding status post colonoscopy. EXAM: PORTABLE ABDOMEN - 1 VIEW COMPARISON:  None. FINDINGS: The bowel gas pattern is normal. There is no evidence of free  air. No  radio-opaque calculi or other significant radiographic abnormality are seen. IMPRESSION: Negative. Electronically Signed   By: Virgina Norfolk M.D.   On: 01/31/2020 18:48   IR EMBO ART  VEN HEMORR LYMPH EXTRAV  INC GUIDE ROADMAPPING  Result Date: 02/02/2020 INDICATION: Acute lower GI bleeding, positive nuclear medicine blood scan and positive abdominal pelvis CTA demonstrating cecal bleeding at a resected polyps site where there is an endo clip. EXAM: Ultrasound guidance for vascular access Celiac and SMA catheterizations and angiograms Three additional peripheral right ileocolic and right colic micro catheterization and angiograms Successful micro coil embolization of 2 peripheral branches of the right colic vasculature (to the cecal angio dysplasia distribution) MEDICATIONS: 1% lidocaine local. The antibiotic was administered within 1 hour of the procedure ANESTHESIA/SEDATION: Moderate (conscious) sedation was employed during this procedure. A total of Versed 1.5 mg and Fentanyl 575 mcg was administered intravenously. Moderate Sedation Time: 60 minutes. The patient's level of consciousness and vital signs were monitored continuously by radiology nursing throughout the procedure under my direct supervision. CONTRAST:  85 cc omni 300 FLUOROSCOPY TIME:  Fluoroscopy Time: 11 minutes 54 seconds (1,835 mGy). COMPLICATIONS: None immediate. PROCEDURE: Informed consent was obtained from the patient following explanation of the procedure, risks, benefits and alternatives. The patient understands, agrees and consents for the procedure. All questions were addressed. A time out was performed prior to the initiation of the procedure. Maximal barrier sterile technique utilized including caps, mask, sterile gowns, sterile gloves, large sterile drape, hand hygiene, and Betadine prep. Under sterile conditions and local anesthesia, ultrasound micropuncture access performed of the right common femoral artery. Images  obtained for documentation of the patent right common femoral artery. Five French sheath inserted a Bentson guidewire. C2 catheter utilized to initially select the celiac origin. Celiac angiogram performed. Celiac: The celiac origin is widely patent including its branches. Specifically the splenic, left gastric, hepatic and gastroduodenal vasculature all patent. No active bleeding in the celiac territory. Catheter was retracted and utilized to select the SMA. Several SMA angiograms were performed. Initial SMA: Atherosclerotic changes noted but the SMA is patent. The jejunal and colic branches are patent throughout the central mesentery. Limited assessment of the peripheral right lower quadrant colic branches. For better visualization, a Renegade STC catheter and a single angle GT glide wire were utilized to advance the access peripherally initially into the far distal aspect of the distal SMA. Peripheral distal SMA angiogram performed. Peripheral SMA: Peripheral SMA branches are patent supplying small bowel in the right lower quadrant. Normal vascularity to the small bowel in this region. No evidence of active contrast extravasation or bleeding. Microcatheter was retracted and utilized to select the ileal colic common trunk. Additional peripheral angiography report. Ileocolic common trunk: Right ileocolic trunk branches are patent and supply the cecal area. 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 Renegade STC catheter was advanced over a double angled glidewire more peripherally into a right colic branch. Peripheral right colic angiogram performed. 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 Peripheral right colic branch micro coil embolization. 2 mm  interlock coils ranging in length from 4-6 cm were successfully deployed in the peripheral right colic branch. Following embolization, there is less visualization of the hypervascularity in the cecal area with preservation of the marginal branch arterial supply to the colon. Microcatheter  was retracted into the main right colic branch. Repeat angiogram again confirms additional branches supplying the tortuous hypervascular cecal area with the early draining vein remaining. 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 Microcatheter was advanced into a inferior right colic peripheral branch directed toward the endo clip site. Second peripheral right colic branch angiogram performed. Second inferior peripheral right colic branch: This right colic branch also demonstrates arterial supply to the tortuous hypervascular area of the cecum with an early draining vein again compatible with angiodysplasia. Second inferior peripheral right colic branch micro coil embolization: Additional 2 mm x 6 cm micro coil was deployed within this second inferior right colic branch. Microcatheter again was retracted more proximal into the main right colic branch. Repeat right colic angiogram performed. Right colic angiogram: Following embolization of the 2 peripheral right colic branches, there is less arterial supply to the hypervascular cecal abnormality with the early draining vein less apparent. There is preserved marginal branch arterial supply to the cecum. At this point, the access was removed. Hemostasis obtained at the right common femoral artery access site with the ExoSeal device. No immediate complication. Patient tolerated the procedure well. 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 Following embolization there is less arterial vascular supply to the cecal angio dysplasia with  preserved marginal branch supply to the colon. Electronically Signed   By: Jerilynn Mages.  Shick M.D.   On: 02/02/2020 13:35   CT Angio Abd/Pel w/ and/or w/o  Result Date: 02/01/2020 CLINICAL DATA:  GI bleed. Colonoscopy yesterday with a polyps removed. EXAM: CTA ABDOMEN AND PELVIS WITHOUT AND WITH CONTRAST TECHNIQUE: Multidetector CT imaging of the abdomen and pelvis was performed using the standard protocol during bolus administration of intravenous contrast. Multiplanar reconstructed images and MIPs were obtained and reviewed to evaluate the vascular anatomy. CONTRAST:  175mL OMNIPAQUE IOHEXOL 350 MG/ML SOLN COMPARISON:  GI bleeding scan earlier today with active bleeding at the hepatic flexure. Abdominal CT yesterday. FINDINGS: VASCULAR Aorta: Moderate atherosclerosis. Calcified and noncalcified plaque with irregular noncalcified plaque anteriorly. 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 No dissection or vasculitis. Celiac: Patent without evidence of aneurysm, dissection, vasculitis or significant stenosis. SMA: Patent without evidence of aneurysm, dissection, vasculitis or significant stenosis. Renals: 2 right and single left renal arteries. Plaque at the origin of all renal arteries without significant stenosis. No acute findings or FMD. IMA: Patent. Inflow: Moderately calcified and tortuous. No stenosis, dissection or acute findings. Proximal Outflow: Moderately calcified. 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 Mild to moderate stenosis involving the proximal right common femoral artery. No acute findings. Veins: Venous phase imaging demonstrates patent portal vein. No evidence of mesenteric venous thrombus. No acute venous abnormality. Circumaortic left renal vein. Review of the MIP images confirms the above findings. NON-VASCULAR Lower chest: No acute findings. No pleural fluid or consolidation. Thin walled cysts in the lower lobes. Heart is normal in  size. Coronary artery calcifications. Hepatobiliary: No focal liver abnormality is seen. Minimal layering stones or sludge in the gallbladder without pericholecystic inflammation. Pancreas: No ductal dilatation or inflammation. Spleen: Normal in size without focal abnormality. Adrenals/Urinary Tract: Normal adrenal glands. No hydronephrosis or perinephric edema. Homogeneous renal enhancement. Small low-density lesions in both kidneys are too small to characterize. Urinary bladder is physiologically distended without wall thickening. Stomach/Bowel: There is active extravasation of contrast into the GI track in the  cecum adjacent to a surgical clip. This is seen on arterial axial images series 6, images 79 and 80, and persist on venous phase, series 8, images 48 and 49. Liquid contents in the ascending and hepatic flexure of the colon. There are scattered ascending colonic diverticula without diverticulitis. Fluid in the transverse and descending colon. Mild distal colonic diverticulosis without diverticulitis. Unremarkable stomach and small bowel. Normal appendix. Lymphatic: No abdominopelvic adenopathy. Reproductive: Prostate is unremarkable. Other: No ascites or free air. There is fat in both inguinal canals. Musculoskeletal: There are no acute or suspicious osseous abnormalities. IMPRESSION: 1. Positive for active extravasation of contrast into the GI track in the cecum adjacent to a surgical clip consistent with acute GI bleed. This correlates to the site of active bleeding on GI bleeding scan earlier today, the cecum is slightly high-riding in the mid abdomen. 2. Aortic atherosclerosis. 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 Recommend follow-up every 3 years. This recommendation follows ACR consensus guidelines: White Paper of the ACR Incidental Findings Committee II on Vascular Findings. J Am Coll Radiol 2013; 10:789-794. 3. Colonic diverticulosis without diverticulitis. 4. Minimal layering stones or  sludge in the gallbladder without gallbladder inflammation. 5. 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 Aortic Atherosclerosis (ICD10-I70.0). These results will be called to the ordering clinician or representative by the Radiologist Assistant, and communication documented in the PACS or [employer]. Electronically Signed   By: Keith Rake M.D.   On: 02/01/2020 22:38    Labs:  CBC: Recent Labs    01/31/20 1734 02/01/20 0219 02/01/20 2144 02/03/20 0527  WBC 15.6* 9.7 7.4  --   HGB 11.7* 10.8* 8.9* 5.7*  HCT 38.4* 32.3* 28.6* 17.9*  PLT 310 155 186  --     COAGS: No results for input(s): INR, APTT in the last 8760 hours.  BMP: Recent Labs    01/31/20 1734 02/01/20 0219  NA 138 137  K 4.0 4.2  CL 103 104  CO2 23 23  GLUCOSE 230* 127*  BUN 13 11  CALCIUM 9.0 8.2*  CREATININE 1.00 0.89  GFRNONAA >60 >60  GFRAA >60 >60    LIVER FUNCTION TESTS: Recent Labs    01/31/20 1734  BILITOT 0.9  AST 20  ALT 18  ALKPHOS 26*  PROT 6.4*  ALBUMIN 3.4*    Assessment and Plan: GI Bleed s/p angiogram with peripheral micro coil embolization of 2 right colic branches to the cecum Patient with 1 large episode of bloody stool this AM.  Additional small bloody stools yesterday.  Hgb dropped to 5.9.  Getting PRBC.  Per patient and family, plan is to undergo colonoscopy this afternoon for further assessment and concern for rebleeding. Case discussed with Dr. Annamaria Boots and Dr. Laurence Ferrari.  No new imaging obtained. Further embolization may be possible depending on location of new/ongoing bleed in relation to prior embolization, however colonscopy first for possible visualization and/or control of bleed.  Groin site stable.  IR following.     Electronically Signed: Docia Barrier, PA 02/03/2020, 12:36 PM   I spent a total of 25 Minutes at the the patient's bedside AND on the patient's hospital floor or unit, greater  than 50% of which was counseling/coordinating care for GI bleed.

## 2020-02-03 NOTE — Anesthesia Preprocedure Evaluation (Signed)
Anesthesia Evaluation  Patient identified by MRN, date of birth, ID band Patient awake    Reviewed: Allergy & Precautions, NPO status , Patient's Chart, lab work & pertinent test results, reviewed documented beta blocker date and time   History of Anesthesia Complications Negative for: history of anesthetic complications  Airway Mallampati: II  TM Distance: >3 FB Neck ROM: Full    Dental  (+) Edentulous Upper, Missing, Dental Advisory Given, Poor Dentition   Pulmonary COPD,  COPD inhaler, Current Smoker and Patient abstained from smoking.,  01/31/2020 SARS coronavirus NEG   breath sounds clear to auscultation       Cardiovascular hypertension, Pt. on medications and Pt. on home beta blockers (-) angina+ dysrhythmias Atrial Fibrillation  Rhythm:Regular Rate:Normal  '17 ECHO: EF 55-60%, valves OK   Neuro/Psych negative neurological ROS  negative psych ROS   GI/Hepatic Neg liver ROS, lower gi bleed   Endo/Other  diabetes, Well Controlled, Type 2, Oral Hypoglycemic AgentsObesity Hyperlipidemia  Renal/GU Renal InsufficiencyRenal diseasenegative Renal ROS   H/o Fournier's gangrene negative genitourinary   Musculoskeletal negative musculoskeletal ROS (+)   Abdominal (+) + obese,   Peds  Hematology  (+) anemia , Hb 11.7, and now has received 1u PRBC   Anesthesia Other Findings   Reproductive/Obstetrics                             Anesthesia Physical  Anesthesia Plan  ASA: III and emergent  Anesthesia Plan: MAC   Post-op Pain Management:    Induction: Intravenous  PONV Risk Score and Plan: 0 and Treatment may vary due to age or medical condition and Propofol infusion  Airway Management Planned: Natural Airway and Simple Face Mask  Additional Equipment:   Intra-op Plan:   Post-operative Plan:   Informed Consent: I have reviewed the patients History and Physical, chart, labs and  discussed the procedure including the risks, benefits and alternatives for the proposed anesthesia with the patient or authorized representative who has indicated his/her understanding and acceptance.     Dental advisory given  Plan Discussed with: CRNA and Surgeon  Anesthesia Plan Comments:         Anesthesia Quick Evaluation

## 2020-02-03 NOTE — Op Note (Signed)
[facility] Patient Name: Jonathan Tucker Procedure Date : 02/03/2020 MRN: [id number] Attending MD: Ronnette Juniper , MD Date of Birth: 04/04/1951 CSN: [id number] Age: 69 Admit Type: Inpatient Procedure:                Colonoscopy Indications:              Rectal bleeding, post polypectomy, s/p colonoscopy                            with endoclip placement and then IR guided                            embolization, but has required multiple units of                            PRBC transfusion and continued rectal bleeding Providers:                Ronnette Juniper, MD, Josie Dixon, RN, Tyna Jaksch                            Technician, Laverda Sorenson, Technician Referring MD:             [facility] Medicines:                Monitored Anesthesia Care Complications:            No immediate complications. Estimated blood loss:                            None. Estimated Blood Loss:     Estimated blood loss: none. Procedure:                Pre-Anesthesia Assessment:                           - Prior to the procedure, a History and Physical                            was performed, and patient medications and                            allergies were reviewed. The patient's tolerance of                            previous anesthesia was also reviewed. The risks                            and benefits of the procedure and the sedation                            options and risks were discussed with the patient.                            All questions were answered, and informed consent  was obtained. Prior Anticoagulants: The patient has                            taken no previous anticoagulant or antiplatelet                            agents. ASA Grade Assessment: III - A patient with                            severe systemic disease. After reviewing the risks                            and benefits, the patient was deemed in                             satisfactory condition to undergo the procedure.                           After obtaining informed consent, the colonoscope                            was passed under direct vision. Throughout the                            procedure, the patient's blood pressure, pulse, and                            oxygen saturations were monitored continuously. The                            [id number]-H190DL ([id number]) Olympus pediatric colonoscope                            was introduced through the anus and advanced to the                            the cecum, identified by appendiceal orifice and                            ileocecal valve. The colonoscopy was performed                            without difficulty. The patient tolerated the                            procedure well. The quality of the bowel                            preparation was poor. Scope In: 3:43:10 PM Scope Out: 4:32:45 PM Scope Withdrawal Time: 0 hours 46 minutes 49 seconds  Total Procedure Duration: 0 hours 49 minutes 35 seconds  Findings:      Red blood was found in the rectum, in the recto-sigmoid colon, in the       sigmoid colon, in the  descending colon, at the splenic flexure, in the       transverse colon and at the hepatic flexure.      A moderate amount of yellow colored liquid and semi-liquid stool was       found in the ascending colon and in the cecum, interfering with       visualization. Lavage of the area was performed, resulting in clearance       with fair visualization.      Previously placed endoclip was noted adjacent to the site of       polypectomy. A localized area of moderately ulcerated mucosa with       pigmentation and small visible vessel was found in the cecum. To close a       defect after polypectomy, one hemostatic clip was successfully placed       (MR conditional). There was no bleeding during, or at the end, of the       procedure.      A localized area of moderately ulcerated mucosa with  pigmentation was       found in the ascending colon. To close a defect after polypectomy, one       hemostatic clip was successfully placed (MR conditional). There was no       bleeding during, or at the end, of the procedure.      A localized area of moderately ulcerated mucosa was found in the       transverse colon. To close a defect after polypectomy, one hemostatic       clip was successfully placed (MR conditional). There was no bleeding       during, or at the end, of the procedure.      A localized area of moderately ulcerated mucosa was found in the sigmoid       and rectum. To close a defect after polypectomy, two hemostatic clips       were successfully placed (MR conditional). There was no bleeding during,       or at the end, of the procedure.      A 6 mm polyp was found in the transverse colon. The polyp was sessile.       Polypectomy was not attempted.      Multiple small and large-mouthed diverticula were found in the sigmoid       colon and descending colon. Impression:               - Preparation of the colon was poor.                           - Blood in the rectum, in the recto-sigmoid colon,                            in the sigmoid colon, in the descending colon, at                            the splenic flexure, in the transverse colon and at                            the hepatic flexure.                           - Stool  in the ascending colon and in the cecum.                           - Ulcerated mucosa in the cecum. Clip (MR                            conditional) was placed.                           - Ulcerated mucosa in the ascending colon. Clip (MR                            conditional) was placed.                           - Ulcerated mucosa in the transverse colon. Clip                            (MR conditional) was placed.                           - Ulcerated mucosa in the sigmoid colon and rectum.                            Clips (MR conditional)  were placed.                           - One 6 mm polyp in the transverse colon. Resection                            not attempted.                           - Diverticulosis in the sigmoid colon and in the                            descending colon.                           - No specimens collected. Moderate Sedation:      Patient did not receive moderate sedation for this procedure, but       instead received monitored anesthesia care. Recommendation:           - Clear liquid diet. Procedure Code(s):        --- Professional ---                           [phone], Colonoscopy, flexible; diagnostic, including                            collection of specimen(s) by brushing or washing,                            when performed (separate procedure) Diagnosis Code(s):        --- Professional ---  K62.5, Hemorrhage of anus and rectum                           K92.2, Gastrointestinal hemorrhage, unspecified                           K63.3, Ulcer of intestine                           K63.5, Polyp of colon                           K57.30, Diverticulosis of large intestine without                            perforation or abscess without bleeding CPT copyright 2019 [employer]. All rights reserved. The codes documented in this report are preliminary and upon coder review may  be revised to meet current compliance requirements. Ronnette Juniper, MD 02/03/2020 4:52:10 PM This report has been signed electronically. Number of Addenda: 0

## 2020-02-03 NOTE — Progress Notes (Signed)
[facility] PROGRESS NOTE    Progress Note  Jonathan Tucker  VHQ:[id number] DOB: [birth year] DOA: 01/31/2020 PCP: Lennie Odor, PA     Brief Narrative:   Jonathan Tucker is an 69 y.o. male atrial fibrillation on aspirin 325, this type II, essential hypertension was brought into the ED after multiple episode of rectal bleeding post colonoscopy with 8 polyps removed she done that afternoon developed frank rectal bleeding with abdominal discomfort in the ER she was found to have a syncopal episode did not hit her head her hemoglobin was around 11.  The ER patient had a syncopal episode and did not hit her head systolic blood pressure was in the 80s she was fluid resuscitated, CT scan of the abdomen and pelvis did not show any acute findings Covid PCR was negative patient had to be taken for urgent colonoscopy and she received 2 additional units of packed red blood cells.  Assessment/Plan:   Acute GI bleeding: Status post colonoscopy that showed ulcers in the cecum. She is status post 2 units of packed red blood cells. GI related that she might need another colonoscopy before she is discharged Status post angiogram with embolization on 02/01/2020, after this he had multiple bloody bowel movements overnight his hemoglobin came down to 5.7, he was transfused an additional 3 units of packed red blood cells.  CBC posttransfusion note, at this time his blood pressure is holding steady he is mildly tachycardic at 110. Awaiting further GI recommendations.  Hemorrhagic shock: Status post 5 units of packed red blood cells including the ones from today, continue to monitor hemoglobin closely.  Paroxysmal atrial fibrillation: Rate controlled not on anticoagulation only taking aspirin at home continue to hold. Beta-blocker is on hold due to hypotensive episode.  Uncontrolled diabetes mellitus ([place]): He is currently on a clear liquid diet, only requiring 1 to 2 units of sliding scale to control blood  glucose, continue to monitor CBGs every 4. Continue current diet.  COPD (chronic obstructive pulmonary disease) ([place]) Noted.   DVT prophylaxis: scd Family Communication:none Status is: Inpatient  Remains inpatient appropriate because:Hemodynamically unstable   Dispo: The patient is from: Home              Anticipated d/c is to: Home              Anticipated d/c date is: 2 days              Patient currently is not medically stable to d/c.     Code Status:     Code Status Orders  (From admission, onward)         Start     Ordered   01/31/20 2232  Full code  Continuous        01/31/20 2232        Code Status History    Date Active Date Inactive Code Status Order ID Comments User Context   03/14/2016 1255 03/25/2016 1713 Full Code [id number]  Murlean Iba, MD ED   Advance Care Planning Activity    Advance Directive Documentation     Most Recent Value  Type of Advance Directive Living will  Pre-existing out of facility DNR order (yellow form or pink MOST form) --  "MOST" Form in Place? --        IV Access:    Peripheral IV   Procedures and diagnostic studies:   NM GI Blood Loss  Result Date: 02/01/2020 CLINICAL DATA:  GI bleed. History of colonoscopy  yesterday with 8 polyps removed. EXAM: NUCLEAR MEDICINE GASTROINTESTINAL BLEEDING SCAN TECHNIQUE: Sequential abdominal images were obtained following intravenous administration of Tc-49m labeled red blood cells. RADIOPHARMACEUTICALS:  26.5 mCi Tc-73m pertechnetate in-vitro labeled red cells. COMPARISON:  CT scan 01/31/2020 FINDINGS: At 10 minutes there is a focus of activity noted in the right abdomen in the region of the hepatic flexure. This shows progressive increased intensity. Activity then moves across the transverse colon to the splenic flexure and down into the descending colon. Findings consistent with a bleeding source in the right colon near the hepatic flexure. IMPRESSION: Findings consistent with  a bleeding source in the right colon near the hepatic flexure. Electronically Signed   By: Marijo Sanes M.D.   On: 02/01/2020 20:57   IR Angiogram Visceral Selective  Result Date: 02/02/2020 INDICATION: Acute lower GI bleeding, positive nuclear medicine blood scan and positive abdominal pelvis CTA demonstrating cecal bleeding at a resected polyps site where there is an endo clip. EXAM: Ultrasound guidance for vascular access Celiac and SMA catheterizations and angiograms Three additional peripheral right ileocolic and right colic micro catheterization and angiograms Successful micro coil embolization of 2 peripheral branches of the right colic vasculature (to the cecal angio dysplasia distribution) MEDICATIONS: 1% lidocaine local. The antibiotic was administered within 1 hour of the procedure ANESTHESIA/SEDATION: Moderate (conscious) sedation was employed during this procedure. A total of Versed 1.5 mg and Fentanyl 575 mcg was administered intravenously. Moderate Sedation Time: 60 minutes. The patient's level of consciousness and vital signs were monitored continuously by radiology nursing throughout the procedure under my direct supervision. CONTRAST:  85 cc omni 300 FLUOROSCOPY TIME:  Fluoroscopy Time: 11 minutes 54 seconds (1,835 mGy). COMPLICATIONS: None immediate. PROCEDURE: Informed consent was obtained from the patient following explanation of the procedure, risks, benefits and alternatives. The patient understands, agrees and consents for the procedure. All questions were addressed. A time out was performed prior to the initiation of the procedure. Maximal barrier sterile technique utilized including caps, mask, sterile gowns, sterile gloves, large sterile drape, hand hygiene, and Betadine prep. Under sterile conditions and local anesthesia, ultrasound micropuncture access performed of the right common femoral artery. Images obtained for documentation of the patent right common femoral artery. Five  French sheath inserted a Bentson guidewire. C2 catheter utilized to initially select the celiac origin. Celiac angiogram performed. Celiac: The celiac origin is widely patent including its branches. Specifically the splenic, left gastric, hepatic and gastroduodenal vasculature all patent. No active bleeding in the celiac territory. Catheter was retracted and utilized to select the SMA. Several SMA angiograms were performed. Initial SMA: Atherosclerotic changes noted but the SMA is patent. The jejunal and colic branches are patent throughout the central mesentery. Limited assessment of the peripheral right lower quadrant colic branches. For better visualization, a Renegade STC catheter and a single angle GT glide wire were utilized to advance the access peripherally initially into the far distal aspect of the distal SMA. Peripheral distal SMA angiogram performed. Peripheral SMA: Peripheral SMA branches are patent supplying small bowel in the right lower quadrant. Normal vascularity to the small bowel in this region. No evidence of active contrast extravasation or bleeding. Microcatheter was retracted and utilized to select the ileal colic common trunk. Additional peripheral angiography report. Ileocolic common trunk: Right ileocolic trunk branches are patent and supply the cecal area. 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 Renegade STC catheter was advanced  over a double angled glidewire more peripherally into a right colic branch. Peripheral right colic angiogram performed. 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 Peripheral right colic branch micro coil embolization. 2 mm interlock coils ranging in length from 4-6 cm were successfully deployed in the  peripheral right colic branch. Following embolization, there is less visualization of the hypervascularity in the cecal area with preservation of the marginal branch arterial supply to the colon. Microcatheter was retracted into the main right colic branch. Repeat angiogram again confirms additional branches supplying the tortuous hypervascular cecal area with the early draining vein remaining. 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 Microcatheter was advanced into a inferior right colic peripheral branch directed toward the endo clip site. Second peripheral right colic branch angiogram performed. Second inferior peripheral right colic branch: This right colic branch also demonstrates arterial supply to the tortuous hypervascular area of the cecum with an early draining vein again compatible with angiodysplasia. Second inferior peripheral right colic branch micro coil embolization: Additional 2 mm x 6 cm micro coil was deployed within this second inferior right colic branch. Microcatheter again was retracted more proximal into the main right colic branch. Repeat right colic angiogram performed. Right colic angiogram: Following embolization of the 2 peripheral right colic branches, there is less arterial supply to the hypervascular cecal abnormality with the early draining vein less apparent. There is preserved marginal branch arterial supply to the cecum. At this point, the access was removed. Hemostasis obtained at the right common femoral artery access site with the ExoSeal device. No immediate complication. Patient tolerated the procedure well. 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 Following embolization there is less arterial vascular supply to the cecal angio dysplasia with preserved marginal branch supply to the colon. Electronically Signed   By: Jerilynn Mages.  Shick  M.D.   On: 02/02/2020 13:35   IR Angiogram Visceral Selective  Result Date: 02/02/2020 INDICATION: Acute lower GI bleeding, positive nuclear medicine blood scan and positive abdominal pelvis CTA demonstrating cecal bleeding at a resected polyps site where there is an endo clip. EXAM: Ultrasound guidance for vascular access Celiac and SMA catheterizations and angiograms Three additional peripheral right ileocolic and right colic micro catheterization and angiograms Successful micro coil embolization of 2 peripheral branches of the right colic vasculature (to the cecal angio dysplasia distribution) MEDICATIONS: 1% lidocaine local. The antibiotic was administered within 1 hour of the procedure ANESTHESIA/SEDATION: Moderate (conscious) sedation was employed during this procedure. A total of Versed 1.5 mg and Fentanyl 575 mcg was administered intravenously. Moderate Sedation Time: 60 minutes. The patient's level of consciousness and vital signs were monitored continuously by radiology nursing throughout the procedure under my direct supervision. CONTRAST:  85 cc omni 300 FLUOROSCOPY TIME:  Fluoroscopy Time: 11 minutes 54 seconds (1,835 mGy). COMPLICATIONS: None immediate. PROCEDURE: Informed consent was obtained from the patient following explanation of the procedure, risks, benefits and alternatives. The patient understands, agrees and consents for the procedure. All questions were addressed. A time out was performed prior to the initiation of the procedure. Maximal barrier sterile technique utilized including caps, mask, sterile gowns, sterile gloves, large sterile drape, hand hygiene, and Betadine prep. Under sterile conditions and local anesthesia, ultrasound micropuncture access performed of the right common femoral artery. Images obtained for documentation  of the patent right common femoral artery. Five French sheath inserted a Bentson guidewire. C2 catheter utilized to initially select the celiac origin.  Celiac angiogram performed. Celiac: The celiac origin is widely patent including its branches. Specifically the splenic, left gastric, hepatic and gastroduodenal vasculature all patent. No active bleeding in the celiac territory. Catheter was retracted and utilized to select the SMA. Several SMA angiograms were performed. Initial SMA: Atherosclerotic changes noted but the SMA is patent. The jejunal and colic branches are patent throughout the central mesentery. Limited assessment of the peripheral right lower quadrant colic branches. For better visualization, a Renegade STC catheter and a single angle GT glide wire were utilized to advance the access peripherally initially into the far distal aspect of the distal SMA. Peripheral distal SMA angiogram performed. Peripheral SMA: Peripheral SMA branches are patent supplying small bowel in the right lower quadrant. Normal vascularity to the small bowel in this region. No evidence of active contrast extravasation or bleeding. Microcatheter was retracted and utilized to select the ileal colic common trunk. Additional peripheral angiography report. Ileocolic common trunk: Right ileocolic trunk branches are patent and supply the cecal area. 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 Renegade STC catheter was advanced over a double angled glidewire more peripherally into a right colic branch. Peripheral right colic angiogram performed. 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 Peripheral right colic branch micro coil embolization. 2 mm interlock coils ranging in length from 4-6 cm were successfully deployed in the peripheral right colic branch. Following embolization, there is less visualization of the hypervascularity  in the cecal area with preservation of the marginal branch arterial supply to the colon. Microcatheter was retracted into the main right colic branch. Repeat angiogram again confirms additional branches supplying the tortuous hypervascular cecal area with the early draining vein remaining. 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 Microcatheter was advanced into a inferior right colic peripheral branch directed toward the endo clip site. Second peripheral right colic branch angiogram performed. Second inferior peripheral right colic branch: This right colic branch also demonstrates arterial supply to the tortuous hypervascular area of the cecum with an early draining vein again compatible with angiodysplasia. Second inferior peripheral right colic branch micro coil embolization: Additional 2 mm x 6 cm micro coil was deployed within this second inferior right colic branch. Microcatheter again was retracted more proximal into the main right colic branch. Repeat right colic angiogram performed. Right colic angiogram: Following embolization of the 2 peripheral right colic branches, there is less arterial supply to the hypervascular cecal abnormality with the early draining vein less apparent. There is preserved marginal branch arterial supply to the cecum. At this point, the access was removed. Hemostasis obtained at the right common femoral artery access site with the ExoSeal device. No immediate complication. Patient tolerated the procedure well. 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 Following embolization there is less arterial vascular supply to the cecal angio dysplasia with preserved marginal branch supply to the colon. Electronically Signed   By: Jerilynn Mages.  Shick M.D.   On: 02/02/2020 13:35   IR Angiogram Selective Each Additional Vessel  Result Date:  02/02/2020 INDICATION: Acute lower GI bleeding, positive nuclear medicine blood  scan and positive abdominal pelvis CTA demonstrating cecal bleeding at a resected polyps site where there is an endo clip. EXAM: Ultrasound guidance for vascular access Celiac and SMA catheterizations and angiograms Three additional peripheral right ileocolic and right colic micro catheterization and angiograms Successful micro coil embolization of 2 peripheral branches of the right colic vasculature (to the cecal angio dysplasia distribution) MEDICATIONS: 1% lidocaine local. The antibiotic was administered within 1 hour of the procedure ANESTHESIA/SEDATION: Moderate (conscious) sedation was employed during this procedure. A total of Versed 1.5 mg and Fentanyl 575 mcg was administered intravenously. Moderate Sedation Time: 60 minutes. The patient's level of consciousness and vital signs were monitored continuously by radiology nursing throughout the procedure under my direct supervision. CONTRAST:  85 cc omni 300 FLUOROSCOPY TIME:  Fluoroscopy Time: 11 minutes 54 seconds (1,835 mGy). COMPLICATIONS: None immediate. PROCEDURE: Informed consent was obtained from the patient following explanation of the procedure, risks, benefits and alternatives. The patient understands, agrees and consents for the procedure. All questions were addressed. A time out was performed prior to the initiation of the procedure. Maximal barrier sterile technique utilized including caps, mask, sterile gowns, sterile gloves, large sterile drape, hand hygiene, and Betadine prep. Under sterile conditions and local anesthesia, ultrasound micropuncture access performed of the right common femoral artery. Images obtained for documentation of the patent right common femoral artery. Five French sheath inserted a Bentson guidewire. C2 catheter utilized to initially select the celiac origin. Celiac angiogram performed. Celiac: The celiac origin is widely patent including its  branches. Specifically the splenic, left gastric, hepatic and gastroduodenal vasculature all patent. No active bleeding in the celiac territory. Catheter was retracted and utilized to select the SMA. Several SMA angiograms were performed. Initial SMA: Atherosclerotic changes noted but the SMA is patent. The jejunal and colic branches are patent throughout the central mesentery. Limited assessment of the peripheral right lower quadrant colic branches. For better visualization, a Renegade STC catheter and a single angle GT glide wire were utilized to advance the access peripherally initially into the far distal aspect of the distal SMA. Peripheral distal SMA angiogram performed. Peripheral SMA: Peripheral SMA branches are patent supplying small bowel in the right lower quadrant. Normal vascularity to the small bowel in this region. No evidence of active contrast extravasation or bleeding. Microcatheter was retracted and utilized to select the ileal colic common trunk. Additional peripheral angiography report. Ileocolic common trunk: Right ileocolic trunk branches are patent and supply the cecal area. 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 Renegade STC catheter was advanced over a double angled glidewire more peripherally into a right colic branch. Peripheral right colic angiogram performed. 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 Peripheral right colic branch micro coil embolization. 2 mm interlock coils ranging in length from 4-6 cm were successfully deployed in the peripheral right colic branch. Following embolization, there is less visualization of the hypervascularity in the cecal area with preservation of the marginal branch arterial supply to the  colon. Microcatheter was retracted into the main right colic branch. Repeat angiogram again confirms additional branches supplying the tortuous hypervascular cecal area with the early draining vein remaining. 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 Microcatheter was advanced into a inferior right colic peripheral branch directed toward the endo clip site. Second peripheral right  colic branch angiogram performed. Second inferior peripheral right colic branch: This right colic branch also demonstrates arterial supply to the tortuous hypervascular area of the cecum with an early draining vein again compatible with angiodysplasia. Second inferior peripheral right colic branch micro coil embolization: Additional 2 mm x 6 cm micro coil was deployed within this second inferior right colic branch. Microcatheter again was retracted more proximal into the main right colic branch. Repeat right colic angiogram performed. Right colic angiogram: Following embolization of the 2 peripheral right colic branches, there is less arterial supply to the hypervascular cecal abnormality with the early draining vein less apparent. There is preserved marginal branch arterial supply to the cecum. At this point, the access was removed. Hemostasis obtained at the right common femoral artery access site with the ExoSeal device. No immediate complication. Patient tolerated the procedure well. 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 Following embolization there is less arterial vascular supply to the cecal angio dysplasia with preserved marginal branch supply to the colon. Electronically Signed   By: Jerilynn Mages.  Shick M.D.   On: 02/02/2020 13:35   IR Angiogram Selective Each Additional Vessel  Result Date: 02/02/2020 INDICATION: Acute lower GI bleeding, positive nuclear medicine blood scan and  positive abdominal pelvis CTA demonstrating cecal bleeding at a resected polyps site where there is an endo clip. EXAM: Ultrasound guidance for vascular access Celiac and SMA catheterizations and angiograms Three additional peripheral right ileocolic and right colic micro catheterization and angiograms Successful micro coil embolization of 2 peripheral branches of the right colic vasculature (to the cecal angio dysplasia distribution) MEDICATIONS: 1% lidocaine local. The antibiotic was administered within 1 hour of the procedure ANESTHESIA/SEDATION: Moderate (conscious) sedation was employed during this procedure. A total of Versed 1.5 mg and Fentanyl 575 mcg was administered intravenously. Moderate Sedation Time: 60 minutes. The patient's level of consciousness and vital signs were monitored continuously by radiology nursing throughout the procedure under my direct supervision. CONTRAST:  85 cc omni 300 FLUOROSCOPY TIME:  Fluoroscopy Time: 11 minutes 54 seconds (1,835 mGy). COMPLICATIONS: None immediate. PROCEDURE: Informed consent was obtained from the patient following explanation of the procedure, risks, benefits and alternatives. The patient understands, agrees and consents for the procedure. All questions were addressed. A time out was performed prior to the initiation of the procedure. Maximal barrier sterile technique utilized including caps, mask, sterile gowns, sterile gloves, large sterile drape, hand hygiene, and Betadine prep. Under sterile conditions and local anesthesia, ultrasound micropuncture access performed of the right common femoral artery. Images obtained for documentation of the patent right common femoral artery. Five French sheath inserted a Bentson guidewire. C2 catheter utilized to initially select the celiac origin. Celiac angiogram performed. Celiac: The celiac origin is widely patent including its branches. Specifically the splenic, left gastric, hepatic and gastroduodenal vasculature  all patent. No active bleeding in the celiac territory. Catheter was retracted and utilized to select the SMA. Several SMA angiograms were performed. Initial SMA: Atherosclerotic changes noted but the SMA is patent. The jejunal and colic branches are patent throughout the central mesentery. Limited assessment of the peripheral right lower quadrant colic branches. For better visualization, a Renegade STC catheter and a single angle GT glide wire were utilized to advance the access peripherally initially into the far distal aspect of the distal SMA. Peripheral distal SMA angiogram performed. Peripheral SMA: Peripheral SMA branches are patent supplying small bowel  in the right lower quadrant. Normal vascularity to the small bowel in this region. No evidence of active contrast extravasation or bleeding. Microcatheter was retracted and utilized to select the ileal colic common trunk. Additional peripheral angiography report. Ileocolic common trunk: Right ileocolic trunk branches are patent and supply the cecal area. 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 Renegade STC catheter was advanced over a double angled glidewire more peripherally into a right colic branch. Peripheral right colic angiogram performed. 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 Peripheral right colic branch micro coil embolization. 2 mm interlock coils ranging in length from 4-6 cm were successfully deployed in the peripheral right colic branch. Following embolization, there is less visualization of the hypervascularity in the cecal area with preservation of the marginal branch arterial supply to the colon. Microcatheter was retracted into the main right colic branch. Repeat angiogram again  confirms additional branches supplying the tortuous hypervascular cecal area with the early draining vein remaining. 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 Microcatheter was advanced into a inferior right colic peripheral branch directed toward the endo clip site. Second peripheral right colic branch angiogram performed. Second inferior peripheral right colic branch: This right colic branch also demonstrates arterial supply to the tortuous hypervascular area of the cecum with an early draining vein again compatible with angiodysplasia. Second inferior peripheral right colic branch micro coil embolization: Additional 2 mm x 6 cm micro coil was deployed within this second inferior right colic branch. Microcatheter again was retracted more proximal into the main right colic branch. Repeat right colic angiogram performed. Right colic angiogram: Following embolization of the 2 peripheral right colic branches, there is less arterial supply to the hypervascular cecal abnormality with the early draining vein less apparent. There is preserved marginal branch arterial supply to the cecum. At this point, the access was removed. Hemostasis obtained at the right common femoral artery access site with the ExoSeal device. No immediate complication. Patient tolerated the procedure well. 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 Following embolization there is less arterial vascular supply to the cecal angio dysplasia with preserved marginal branch supply to the colon. Electronically Signed   By: Jerilynn Mages.  Shick M.D.   On: 02/02/2020 13:35   IR Angiogram Selective Each Additional Vessel  Result Date: 02/02/2020 INDICATION: Acute lower GI bleeding, positive nuclear medicine blood scan and positive abdominal pelvis CTA demonstrating cecal bleeding at a resected polyps site where there  is an endo clip. EXAM: Ultrasound guidance for vascular access Celiac and SMA catheterizations and angiograms Three additional peripheral right ileocolic and right colic micro catheterization and angiograms Successful micro coil embolization of 2 peripheral branches of the right colic vasculature (to the cecal angio dysplasia distribution) MEDICATIONS: 1% lidocaine local. The antibiotic was administered within 1 hour of the procedure ANESTHESIA/SEDATION: Moderate (conscious) sedation was employed during this procedure. A total of Versed 1.5 mg and Fentanyl 575 mcg was administered intravenously. Moderate Sedation Time: 60 minutes. The patient's level of consciousness and vital signs were monitored continuously by radiology nursing throughout the procedure under my direct supervision. CONTRAST:  85 cc omni 300 FLUOROSCOPY TIME:  Fluoroscopy Time: 11 minutes 54 seconds (1,835 mGy). COMPLICATIONS: None immediate. PROCEDURE:  Informed consent was obtained from the patient following explanation of the procedure, risks, benefits and alternatives. The patient understands, agrees and consents for the procedure. All questions were addressed. A time out was performed prior to the initiation of the procedure. Maximal barrier sterile technique utilized including caps, mask, sterile gowns, sterile gloves, large sterile drape, hand hygiene, and Betadine prep. Under sterile conditions and local anesthesia, ultrasound micropuncture access performed of the right common femoral artery. Images obtained for documentation of the patent right common femoral artery. Five French sheath inserted a Bentson guidewire. C2 catheter utilized to initially select the celiac origin. Celiac angiogram performed. Celiac: The celiac origin is widely patent including its branches. Specifically the splenic, left gastric, hepatic and gastroduodenal vasculature all patent. No active bleeding in the celiac territory. Catheter was retracted and utilized to  select the SMA. Several SMA angiograms were performed. Initial SMA: Atherosclerotic changes noted but the SMA is patent. The jejunal and colic branches are patent throughout the central mesentery. Limited assessment of the peripheral right lower quadrant colic branches. For better visualization, a Renegade STC catheter and a single angle GT glide wire were utilized to advance the access peripherally initially into the far distal aspect of the distal SMA. Peripheral distal SMA angiogram performed. Peripheral SMA: Peripheral SMA branches are patent supplying small bowel in the right lower quadrant. Normal vascularity to the small bowel in this region. No evidence of active contrast extravasation or bleeding. Microcatheter was retracted and utilized to select the ileal colic common trunk. Additional peripheral angiography report. Ileocolic common trunk: Right ileocolic trunk branches are patent and supply the cecal area. 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 Renegade STC catheter was advanced over a double angled glidewire more peripherally into a right colic branch. Peripheral right colic angiogram performed. 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 Peripheral right colic branch micro coil embolization. 2 mm interlock coils ranging in length from 4-6 cm were successfully deployed in the peripheral right colic branch. Following embolization, there is less visualization of the hypervascularity in the cecal area with preservation of the marginal branch arterial supply to the colon. Microcatheter was retracted into the main right colic branch. Repeat angiogram again confirms additional branches supplying the tortuous hypervascular cecal area with the early  draining vein remaining. 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 Microcatheter was advanced into a inferior right colic peripheral branch directed toward the endo clip site. Second peripheral right colic branch angiogram performed. Second inferior peripheral right colic branch: This right colic branch also demonstrates arterial supply to the tortuous hypervascular area of the cecum with an early draining vein again compatible with angiodysplasia. Second inferior peripheral right colic branch micro coil embolization: Additional 2 mm x 6 cm micro coil was deployed within this second inferior right colic branch. Microcatheter again was retracted more proximal into the main right colic branch. Repeat right colic angiogram performed. Right colic angiogram: Following embolization of the 2 peripheral right colic branches, there is less arterial supply to the hypervascular cecal abnormality with the early draining vein less apparent. There is preserved marginal branch arterial supply to the cecum. At this point, the access was removed. Hemostasis obtained at the right common femoral artery access site with the ExoSeal device. No immediate complication. Patient tolerated the procedure well.  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 Following embolization there is less arterial vascular supply to the cecal angio dysplasia with preserved marginal branch supply to the colon. Electronically Signed   By: Jerilynn Mages.  Shick M.D.   On: 02/02/2020 13:35   IR US Guide Vasc Access Right  Result Date: 02/02/2020 INDICATION: Acute lower GI bleeding, positive nuclear medicine blood scan and positive abdominal pelvis CTA demonstrating cecal bleeding at a resected polyps site where there is an endo clip. EXAM: Ultrasound guidance for vascular access Celiac and SMA catheterizations and  angiograms Three additional peripheral right ileocolic and right colic micro catheterization and angiograms Successful micro coil embolization of 2 peripheral branches of the right colic vasculature (to the cecal angio dysplasia distribution) MEDICATIONS: 1% lidocaine local. The antibiotic was administered within 1 hour of the procedure ANESTHESIA/SEDATION: Moderate (conscious) sedation was employed during this procedure. A total of Versed 1.5 mg and Fentanyl 575 mcg was administered intravenously. Moderate Sedation Time: 60 minutes. The patient's level of consciousness and vital signs were monitored continuously by radiology nursing throughout the procedure under my direct supervision. CONTRAST:  85 cc omni 300 FLUOROSCOPY TIME:  Fluoroscopy Time: 11 minutes 54 seconds (1,835 mGy). COMPLICATIONS: None immediate. PROCEDURE: Informed consent was obtained from the patient following explanation of the procedure, risks, benefits and alternatives. The patient understands, agrees and consents for the procedure. All questions were addressed. A time out was performed prior to the initiation of the procedure. Maximal barrier sterile technique utilized including caps, mask, sterile gowns, sterile gloves, large sterile drape, hand hygiene, and Betadine prep. Under sterile conditions and local anesthesia, ultrasound micropuncture access performed of the right common femoral artery. Images obtained for documentation of the patent right common femoral artery. Five French sheath inserted a Bentson guidewire. C2 catheter utilized to initially select the celiac origin. Celiac angiogram performed. Celiac: The celiac origin is widely patent including its branches. Specifically the splenic, left gastric, hepatic and gastroduodenal vasculature all patent. No active bleeding in the celiac territory. Catheter was retracted and utilized to select the SMA. Several SMA angiograms were performed. Initial SMA: Atherosclerotic changes noted but  the SMA is patent. The jejunal and colic branches are patent throughout the central mesentery. Limited assessment of the peripheral right lower quadrant colic branches. For better visualization, a Renegade STC catheter and a single angle GT glide wire were utilized to advance the access peripherally initially into the far distal aspect of the distal SMA. Peripheral distal SMA angiogram performed. Peripheral SMA: Peripheral SMA branches are patent supplying small bowel in the right lower quadrant. Normal vascularity to the small bowel in this region. No evidence of active contrast extravasation or bleeding. Microcatheter was retracted and utilized to select the ileal colic common trunk. Additional peripheral angiography report. Ileocolic common trunk: Right ileocolic trunk branches are patent and supply the cecal area. 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 Renegade STC catheter was advanced over a double angled glidewire more peripherally into a right colic branch. Peripheral right colic angiogram performed. 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 Peripheral right colic branch micro coil embolization. 2 mm interlock coils ranging in length from 4-6 cm were successfully deployed in the peripheral  right colic branch. Following embolization, there is less visualization of the hypervascularity in the cecal area with preservation of the marginal branch arterial supply to the colon. Microcatheter was retracted into the main right colic branch. Repeat angiogram again confirms additional branches supplying the tortuous hypervascular cecal area with the early draining vein remaining. 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 Microcatheter was  advanced into a inferior right colic peripheral branch directed toward the endo clip site. Second peripheral right colic branch angiogram performed. Second inferior peripheral right colic branch: This right colic branch also demonstrates arterial supply to the tortuous hypervascular area of the cecum with an early draining vein again compatible with angiodysplasia. Second inferior peripheral right colic branch micro coil embolization: Additional 2 mm x 6 cm micro coil was deployed within this second inferior right colic branch. Microcatheter again was retracted more proximal into the main right colic branch. Repeat right colic angiogram performed. Right colic angiogram: Following embolization of the 2 peripheral right colic branches, there is less arterial supply to the hypervascular cecal abnormality with the early draining vein less apparent. There is preserved marginal branch arterial supply to the cecum. At this point, the access was removed. Hemostasis obtained at the right common femoral artery access site with the ExoSeal device. No immediate complication. Patient tolerated the procedure well. 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 Following embolization there is less arterial vascular supply to the cecal angio dysplasia with preserved marginal branch supply to the colon. Electronically Signed   By: Jerilynn Mages.  Shick M.D.   On: 02/02/2020 13:35   IR EMBO ART  VEN HEMORR LYMPH EXTRAV  INC GUIDE ROADMAPPING  Result Date: 02/02/2020 INDICATION: Acute lower GI bleeding, positive nuclear medicine blood scan and positive abdominal pelvis CTA demonstrating cecal bleeding at a resected polyps site where there is an endo clip. EXAM: Ultrasound guidance for vascular access Celiac and SMA catheterizations and angiograms Three additional peripheral right ileocolic and right colic micro  catheterization and angiograms Successful micro coil embolization of 2 peripheral branches of the right colic vasculature (to the cecal angio dysplasia distribution) MEDICATIONS: 1% lidocaine local. The antibiotic was administered within 1 hour of the procedure ANESTHESIA/SEDATION: Moderate (conscious) sedation was employed during this procedure. A total of Versed 1.5 mg and Fentanyl 575 mcg was administered intravenously. Moderate Sedation Time: 60 minutes. The patient's level of consciousness and vital signs were monitored continuously by radiology nursing throughout the procedure under my direct supervision. CONTRAST:  85 cc omni 300 FLUOROSCOPY TIME:  Fluoroscopy Time: 11 minutes 54 seconds (1,835 mGy). COMPLICATIONS: None immediate. PROCEDURE: Informed consent was obtained from the patient following explanation of the procedure, risks, benefits and alternatives. The patient understands, agrees and consents for the procedure. All questions were addressed. A time out was performed prior to the initiation of the procedure. Maximal barrier sterile technique utilized including caps, mask, sterile gowns, sterile gloves, large sterile drape, hand hygiene, and Betadine prep. Under sterile conditions and local anesthesia, ultrasound micropuncture access performed of the right common femoral artery. Images obtained for documentation of the patent right common femoral artery. Five French sheath inserted a Bentson guidewire. C2 catheter utilized to initially select the celiac origin. Celiac angiogram performed. Celiac: The celiac origin is widely patent including its branches. Specifically the splenic, left gastric, hepatic and gastroduodenal vasculature all patent. No active bleeding in the celiac territory. Catheter was retracted and utilized  to select the SMA. Several SMA angiograms were performed. Initial SMA: Atherosclerotic changes noted but the SMA is patent. The jejunal and colic branches are patent throughout the  central mesentery. Limited assessment of the peripheral right lower quadrant colic branches. For better visualization, a Renegade STC catheter and a single angle GT glide wire were utilized to advance the access peripherally initially into the far distal aspect of the distal SMA. Peripheral distal SMA angiogram performed. Peripheral SMA: Peripheral SMA branches are patent supplying small bowel in the right lower quadrant. Normal vascularity to the small bowel in this region. No evidence of active contrast extravasation or bleeding. Microcatheter was retracted and utilized to select the ileal colic common trunk. Additional peripheral angiography report. Ileocolic common trunk: Right ileocolic trunk branches are patent and supply the cecal area. 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 Renegade STC catheter was advanced over a double angled glidewire more peripherally into a right colic branch. Peripheral right colic angiogram performed. 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 Peripheral right colic branch micro coil embolization. 2 mm interlock coils ranging in length from 4-6 cm were successfully deployed in the peripheral right colic branch. Following embolization, there is less visualization of the hypervascularity in the cecal area with preservation of the marginal branch arterial supply to the colon. Microcatheter was retracted into the main right colic branch. Repeat angiogram again confirms additional branches supplying the tortuous hypervascular cecal area with the early draining vein remaining. 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 Microcatheter was advanced into a inferior right colic peripheral branch directed toward the endo  clip site. Second peripheral right colic branch angiogram performed. Second inferior peripheral right colic branch: This right colic branch also demonstrates arterial supply to the tortuous hypervascular area of the cecum with an early draining vein again compatible with angiodysplasia. Second inferior peripheral right colic branch micro coil embolization: Additional 2 mm x 6 cm micro coil was deployed within this second inferior right colic branch. Microcatheter again was retracted more proximal into the main right colic branch. Repeat right colic angiogram performed. Right colic angiogram: Following embolization of the 2 peripheral right colic branches, there is less arterial supply to the hypervascular cecal abnormality with the early draining vein less apparent. There is preserved marginal branch arterial supply to the cecum. At this point, the access was removed. Hemostasis obtained at the right common femoral artery access site with the ExoSeal device. No immediate complication. Patient tolerated the procedure well. 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 Following embolization there is less arterial vascular supply to the cecal angio dysplasia with preserved marginal branch supply to the colon. Electronically Signed   By: Jerilynn Mages.  Shick M.D.   On: 02/02/2020 13:35   CT Angio Abd/Pel w/ and/or w/o  Result Date: 02/01/2020 CLINICAL DATA:  GI bleed. Colonoscopy yesterday with a polyps removed. EXAM: CTA ABDOMEN AND PELVIS WITHOUT AND WITH CONTRAST TECHNIQUE: Multidetector CT imaging of the abdomen and pelvis was performed using the standard protocol during bolus administration of intravenous contrast. Multiplanar reconstructed images and MIPs were obtained and reviewed to evaluate the vascular anatomy. CONTRAST:  199mL OMNIPAQUE IOHEXOL 350 MG/ML SOLN COMPARISON:  GI bleeding  scan  earlier today with active bleeding at the hepatic flexure. Abdominal CT yesterday. FINDINGS: VASCULAR Aorta: Moderate atherosclerosis. Calcified and noncalcified plaque with irregular noncalcified plaque anteriorly. 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 No dissection or vasculitis. Celiac: Patent without evidence of aneurysm, dissection, vasculitis or significant stenosis. SMA: Patent without evidence of aneurysm, dissection, vasculitis or significant stenosis. Renals: 2 right and single left renal arteries. Plaque at the origin of all renal arteries without significant stenosis. No acute findings or FMD. IMA: Patent. Inflow: Moderately calcified and tortuous. No stenosis, dissection or acute findings. Proximal Outflow: Moderately calcified. 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 Mild to moderate stenosis involving the proximal right common femoral artery. No acute findings. Veins: Venous phase imaging demonstrates patent portal vein. No evidence of mesenteric venous thrombus. No acute venous abnormality. Circumaortic left renal vein. Review of the MIP images confirms the above findings. NON-VASCULAR Lower chest: No acute findings. No pleural fluid or consolidation. Thin walled cysts in the lower lobes. Heart is normal in size. Coronary artery calcifications. Hepatobiliary: No focal liver abnormality is seen. Minimal layering stones or sludge in the gallbladder without pericholecystic inflammation. Pancreas: No ductal dilatation or inflammation. Spleen: Normal in size without focal abnormality. Adrenals/Urinary Tract: Normal adrenal glands. No hydronephrosis or perinephric edema. Homogeneous renal enhancement. Small low-density lesions in both kidneys are too small to characterize. Urinary bladder is physiologically distended without wall thickening. Stomach/Bowel: There is active extravasation of contrast into the GI track in the cecum adjacent to  a surgical clip. This is seen on arterial axial images series 6, images 79 and 80, and persist on venous phase, series 8, images 48 and 49. Liquid contents in the ascending and hepatic flexure of the colon. There are scattered ascending colonic diverticula without diverticulitis. Fluid in the transverse and descending colon. Mild distal colonic diverticulosis without diverticulitis. Unremarkable stomach and small bowel. Normal appendix. Lymphatic: No abdominopelvic adenopathy. Reproductive: Prostate is unremarkable. Other: No ascites or free air. There is fat in both inguinal canals. Musculoskeletal: There are no acute or suspicious osseous abnormalities. IMPRESSION: 1. Positive for active extravasation of contrast into the GI track in the cecum adjacent to a surgical clip consistent with acute GI bleed. This correlates to the site of active bleeding on GI bleeding scan earlier today, the cecum is slightly high-riding in the mid abdomen. 2. Aortic atherosclerosis. 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 Recommend follow-up every 3 years. This recommendation follows ACR consensus guidelines: White Paper of the ACR Incidental Findings Committee II on Vascular Findings. J Am Coll Radiol 2013; 10:789-794. 3. Colonic diverticulosis without diverticulitis. 4. Minimal layering stones or sludge in the gallbladder without gallbladder inflammation. 5. 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 Aortic Atherosclerosis (ICD10-I70.0). These results will be called to the ordering clinician or representative by the Radiologist Assistant, and communication documented in the PACS or [employer]. Electronically Signed   By: Keith Rake M.D.   On: 02/01/2020 22:38     Medical Consultants:    None.  Anti-Infectives:   none  Subjective:    Jonathan Tucker relates he feels significantly tired and sleepy today.  Objective:    Vitals:   02/02/20 2006  02/03/20 0011 02/03/20 0343 02/03/20 0746  BP: 136/71 123/75 120/75 120/70  Pulse: 95 95 91 88  Resp: 19 18 17 15   Temp: 98.1 F (36.7 C) 98.2 F (36.8 C) 98.2  F (36.8 C) 98.3 F (36.8 C)  TempSrc: Oral Oral Oral Oral  SpO2: 96% 100% 100% 97%  Weight:      Height:       SpO2: 97 % O2 Flow Rate (L/min): 14 L/min   Intake/Output Summary (Last 24 hours) at 02/03/2020 0812 Last data filed at 02/03/2020 0749 Gross per 24 hour  Intake 1084.06 ml  Output 850 ml  Net 234.06 ml   Filed Weights   01/31/20 1724 02/01/20 0127  Weight: 86.2 kg 109.9 kg    Exam: General exam: In no acute distress. Respiratory system: Good air movement and clear to auscultation. Cardiovascular system: S1 & S2 heard, RRR. No JVD. Gastrointestinal system: Abdomen is nondistended, soft and nontender.  Extremities: No pedal edema. Skin: No rashes, lesions or ulcers Psychiatry: Judgement and insight appear normal. Mood & affect appropriate.   Data Reviewed:    Labs: Basic Metabolic Panel: Recent Labs  Lab 01/31/20 1734 02/01/20 0219  NA 138 137  K 4.0 4.2  CL 103 104  CO2 23 23  GLUCOSE 230* 127*  BUN 13 11  CREATININE 1.00 0.89  CALCIUM 9.0 8.2*   GFR Estimated Creatinine Clearance: 97.3 mL/min (by C-G formula based on SCr of 0.89 mg/dL). Liver Function Tests: Recent Labs  Lab 01/31/20 1734  AST 20  ALT 18  ALKPHOS 26*  BILITOT 0.9  PROT 6.4*  ALBUMIN 3.4*   No results for input(s): LIPASE, AMYLASE in the last 168 hours. No results for input(s): AMMONIA in the last 168 hours. Coagulation profile No results for input(s): INR, PROTIME in the last 168 hours. COVID-19 Labs  No results for input(s): DDIMER, FERRITIN, LDH, CRP in the last 72 hours.  Lab Results  Component Value Date   [place] NEGATIVE 01/31/2020    CBC: Recent Labs  Lab 01/31/20 1734 02/01/20 0219 02/01/20 2144 02/03/20 0527  WBC 15.6* 9.7 7.4  --   NEUTROABS  --  7.9*  --   --   HGB 11.7* 10.8*  8.9* 5.7*  HCT 38.4* 32.3* 28.6* 17.9*  MCV 95.5 89.7 91.7  --   PLT 310 155 186  --    Cardiac Enzymes: No results for input(s): CKTOTAL, CKMB, CKMBINDEX, TROPONINI in the last 168 hours. BNP (last 3 results) No results for input(s): PROBNP in the last 8760 hours. CBG: Recent Labs  Lab 02/02/20 1633 02/02/20 2054 02/03/20 0044 02/03/20 0434 02/03/20 0747  GLUCAP 136* 144* 148* 153* 166*   D-Dimer: No results for input(s): DDIMER in the last 72 hours. Hgb A1c: No results for input(s): HGBA1C in the last 72 hours. Lipid Profile: No results for input(s): CHOL, HDL, LDLCALC, TRIG, CHOLHDL, LDLDIRECT in the last 72 hours. Thyroid function studies: No results for input(s): TSH, T4TOTAL, T3FREE, THYROIDAB in the last 72 hours.  Invalid input(s): FREET3 Anemia work up: No results for input(s): VITAMINB12, FOLATE, FERRITIN, TIBC, IRON, RETICCTPCT in the last 72 hours. Sepsis Labs: Recent Labs  Lab 01/31/20 1734 02/01/20 0219 02/01/20 2144  WBC 15.6* 9.7 7.4   Microbiology Recent Results (from the past 240 hour(s))  SARS Coronavirus 2 by RT PCR (hospital order, performed in [facility] hospital lab) Nasopharyngeal Nasopharyngeal Swab     Status: None   Collection Time: 01/31/20  6:34 PM   Specimen: Nasopharyngeal Swab  Result Value Ref Range Status   SARS Coronavirus 2 NEGATIVE NEGATIVE Final    Comment: (NOTE) SARS-CoV-2 target nucleic acids are NOT DETECTED.  The SARS-CoV-2 RNA is generally  detectable in upper and lower respiratory specimens during the acute phase of infection. The lowest concentration of SARS-CoV-2 viral copies this assay can detect is 250 copies / mL. A negative result does not preclude SARS-CoV-2 infection and should not be used as the sole basis for treatment or other patient management decisions.  A negative result may occur with improper specimen collection / handling, submission of specimen other than nasopharyngeal swab, presence of viral  mutation(s) within the areas targeted by this assay, and inadequate number of viral copies (<250 copies / mL). A negative result must be combined with clinical observations, patient history, and epidemiological information.  Fact Sheet for Patients:   StrictlyIdeas.no  Fact Sheet for Healthcare Providers: BankingDealers.co.za  This test is not yet approved or  cleared by the [place] FDA and has been authorized for detection and/or diagnosis of SARS-CoV-2 by FDA under an Emergency Use Authorization (EUA).  This EUA will remain in effect (meaning this test can be used) for the duration of the COVID-19 declaration under Section 564(b)(1) of the Act, 21 U.S.C. section 360bbb-3(b)(1), unless the authorization is terminated or revoked sooner.  Performed at [facility], [place] [address] [place], [place] [address]      Medications:   . insulin aspart  0-6 Units Subcutaneous Q4H  . pantoprazole (PROTONIX) IV  40 mg Intravenous Q12H   Continuous Infusions: . sodium chloride 20 mL/hr at 02/03/20 0412      LOS: 2 days   Charlynne Cousins  [facility]  02/03/2020, 8:12 AM

## 2020-02-03 NOTE — Op Note (Signed)
Colonoscopy findings:  Red blood was noted in left side of the colon up to transverse, while liquid yellow stool was noted in the cecum and ascending.  Sites of prior polypectomy were identified, some of the sites had pigmentation and small visible vessel. 1 Endo Clip was at the site of prior cecal polypectomy, 1 Endo Clip was placed at the site of prior polypectomy in ascending colon, 1 Endo Clip was placed at the site of prior polypectomy in transverse colon and 2 endoclips were placed at the site of prior polypectomy in the rectum and sigmoid.  Diverticulosis was noted in the sigmoid and descending.  A small polyp was noted in the transverse which was not removed.   Recommendations: Clear liquid diet. Monitor H&H in a.m. If hemoglobin stable plan to advance diet tomorrow morning.

## 2020-02-03 NOTE — Progress Notes (Addendum)
Called by RN.  Patient with oozing of blood during the night.  Just had bowel movement with 250 cc of bright red blood.  Last hemoglobin was yesterday evening with hemoglobin of 8.9.  Patient is hemodynamically stable. Check stat H&H now.  Provide PRBCs as needed if H&H dropping.  Addendum: Today shows hemoglobin of 5.2 and hematocrit of 17.9. 2 units of packed red blood cells ordered to be transfused  Patient may need to call evaluation with persistent GI bleeding after embolization by IR yesterday

## 2020-02-04 DIAGNOSIS — J431 Panlobular emphysema: Secondary | ICD-10-CM

## 2020-02-04 DIAGNOSIS — K625 Hemorrhage of anus and rectum: Secondary | ICD-10-CM

## 2020-02-04 LAB — COMPREHENSIVE METABOLIC PANEL
ALT: 16 U/L (ref 0–44)
AST: 20 U/L (ref 15–41)
Albumin: 2.8 g/dL — ABNORMAL LOW (ref 3.5–5.0)
Alkaline Phosphatase: 31 U/L — ABNORMAL LOW (ref 38–126)
Anion gap: 6 (ref 5–15)
BUN: 8 mg/dL (ref 8–23)
CO2: 30 mmol/L (ref 22–32)
Calcium: 8.5 mg/dL — ABNORMAL LOW (ref 8.9–10.3)
Chloride: 103 mmol/L (ref 98–111)
Creatinine, Ser: 0.96 mg/dL (ref 0.61–1.24)
GFR calc Af Amer: >60 mL/min (ref >60)
GFR calc non Af Amer: >60 mL/min (ref >60)
Glucose, Bld: 140 mg/dL — ABNORMAL HIGH (ref 70–99)
Potassium: 3.5 mmol/L (ref 3.5–5.1)
Sodium: 139 mmol/L (ref 135–145)
Total Bilirubin: 0.4 mg/dL (ref 0.3–1.2)
Total Protein: 5.2 g/dL — ABNORMAL LOW (ref 6.5–8.1)

## 2020-02-04 LAB — CBC
HCT: 24.6 % — ABNORMAL LOW (ref 39.0–52.0)
Hemoglobin: 8.1 g/dL — ABNORMAL LOW (ref 13.0–17.0)
MCH: 30 pg (ref 26.0–34.0)
MCHC: 32.9 g/dL (ref 30.0–36.0)
MCV: 91.1 fL (ref 80.0–100.0)
Platelets: 194 10*3/uL (ref 150–400)
RBC: 2.7 MIL/uL — ABNORMAL LOW (ref 4.22–5.81)
RDW: 15.6 % — ABNORMAL HIGH (ref 11.5–15.5)
WBC: 5.8 10*3/uL (ref 4.0–10.5)
nRBC: 0 % (ref 0.0–0.2)

## 2020-02-04 LAB — MAGNESIUM: Magnesium: 1.6 mg/dL — ABNORMAL LOW (ref 1.7–2.4)

## 2020-02-04 LAB — BPAM RBC
Blood Product Expiration Date: 202108232359
Blood Product Expiration Date: 202109062359
Blood Product Expiration Date: 202109092359
Blood Product Expiration Date: 202109112359
Blood Product Expiration Date: 202109172359
ISSUE DATE / TIME: 202108171859
ISSUE DATE / TIME: 202108172152
ISSUE DATE / TIME: 202108200831
ISSUE DATE / TIME: 202108201135
ISSUE DATE / TIME: 202108201436
Unit Type and Rh: 600
Unit Type and Rh: 600
Unit Type and Rh: 600
Unit Type and Rh: 600
Unit Type and Rh: 600

## 2020-02-04 LAB — TYPE AND SCREEN
ABO/RH(D): A NEG
Antibody Screen: NEGATIVE
Unit division: 0
Unit division: 0
Unit division: 0
Unit division: 0
Unit division: 0

## 2020-02-04 LAB — GLUCOSE, CAPILLARY
Glucose-Capillary: 112 mg/dL — ABNORMAL HIGH (ref 70–99)
Glucose-Capillary: 124 mg/dL — ABNORMAL HIGH (ref 70–99)
Glucose-Capillary: 127 mg/dL — ABNORMAL HIGH (ref 70–99)
Glucose-Capillary: 139 mg/dL — ABNORMAL HIGH (ref 70–99)
Glucose-Capillary: 140 mg/dL — ABNORMAL HIGH (ref 70–99)
Glucose-Capillary: 167 mg/dL — ABNORMAL HIGH (ref 70–99)

## 2020-02-04 LAB — PHOSPHORUS: Phosphorus: 2.5 mg/dL (ref 2.5–4.6)

## 2020-02-04 LAB — HEMOGLOBIN A1C
Hgb A1c MFr Bld: 5.8 % — ABNORMAL HIGH (ref 4.8–5.6)
Mean Plasma Glucose: 119.76 mg/dL

## 2020-02-04 MED ORDER — METOPROLOL TARTRATE 25 MG PO TABS
25.0000 mg | ORAL_TABLET | Freq: Two times a day (BID) | ORAL | Status: DC
  Administered 2020-02-04 – 2020-02-05 (×2): 25 mg via ORAL
  Filled 2020-02-04 (×2): qty 1

## 2020-02-04 MED ORDER — INSULIN ASPART 100 UNIT/ML ~~LOC~~ SOLN
0.0000 [IU] | SUBCUTANEOUS | Status: DC
  Administered 2020-02-04 – 2020-02-05 (×3): 1 [IU] via SUBCUTANEOUS
  Administered 2020-02-05: 2 [IU] via SUBCUTANEOUS
  Administered 2020-02-05: 1 [IU] via SUBCUTANEOUS

## 2020-02-04 NOTE — Progress Notes (Signed)
Referring Physician(s): Ganem,S  Supervising Physician: Jacqulynn Cadet  Patient Status:  [facility] - In-pt  Chief Complaint:  Rectal bleeding  Subjective:  Pt doing ok this am; has had only a "drop of blood " in latest stool; no further transfusions since yesterday; denies worsening abd pain,N/V; had colonoscopy yesterday with endoclipping of prior polypectomy sites  Allergies: Codeine, Flexeril [cyclobenzaprine], and Tape  Medications: Prior to Admission medications   Medication Sig Start Date End Date Taking? Authorizing Provider  aspirin EC 325 MG tablet Take 325 mg by mouth every evening.   Yes [provider]  calcium carbonate (TUMS - DOSED IN MG ELEMENTAL CALCIUM) 500 MG chewable tablet Chew 1 tablet by mouth daily as needed for indigestion or heartburn.   Yes [provider]  colesevelam (WELCHOL) 625 MG tablet Take 1,250 mg by mouth 2 (two) times daily with a meal.   Yes [provider]  fenofibrate (TRICOR) 145 MG tablet Take 145 mg by mouth every evening.   Yes [provider]  hydrochlorothiazide (HYDRODIURIL) 25 MG tablet Take 25 mg by mouth daily.   Yes [provider]  Insulin Degludec (TRESIBA) 100 UNIT/ML SOLN Inject 36 Units into the skin every evening.   Yes [provider]  metFORMIN (GLUCOPHAGE) 500 MG tablet 1 after the largest meal 03/27/16  Yes Hendricks Limes, MD  metoprolol tartrate (LOPRESSOR) 25 MG tablet Take 1 tablet (25 mg total) by mouth 2 (two) times daily. 03/27/16  Yes Hendricks Limes, MD  naproxen sodium (ANAPROX) 220 MG tablet Take 440 mg by mouth daily as needed (pain).   Yes [provider]  ramipril (ALTACE) 10 MG capsule Take 10 mg by mouth every evening.   Yes [provider]  rosuvastatin (CRESTOR) 5 MG tablet Take 5 mg by mouth every evening.   Yes [provider]  TRULICITY 1.5 NK/5.3ZJ SOPN Inject 1.5 mg into the skin once a week.  08/15/19  Yes [provider]  senna-docusate (SENOKOT-S) 8.6-50 MG tablet Take 1 tablet by mouth at bedtime as needed for mild constipation. Patient not taking: Reported on 01/31/2020 03/25/16   Dhungel, Flonnie Overman, MD  sulfamethoxazole-trimethoprim (BACTRIM DS,SEPTRA DS) 800-160 MG tablet Take 1 tablet by mouth every 12 (twelve) hours. Patient not taking: Reported on 01/31/2020 03/25/16   Dhungel, Flonnie Overman, MD     Vital Signs: BP (!) 154/81 (BP Location: Left Arm)   Pulse 85   Temp 97.8 F (36.6 C) (Oral)   Resp 17   Ht 5\' 11"  (1.803 m)   Wt 242 lb 4.6 oz (109.9 kg)   SpO2 99%   BMI 33.79 kg/m   Physical Exam awake/alert; abd soft,NT; rt CFA access site soft, some minimal tenderness upper medial portion but no firmness or ecchymosis noted; distal pulses ok  Imaging: NM GI Blood Loss  Result Date: 02/01/2020 CLINICAL DATA:  GI bleed. History of colonoscopy yesterday with 8 polyps removed. EXAM: NUCLEAR MEDICINE GASTROINTESTINAL BLEEDING SCAN TECHNIQUE: Sequential abdominal images were obtained following intravenous administration of Tc-64m labeled red blood cells. RADIOPHARMACEUTICALS:  26.5 mCi Tc-32m pertechnetate in-vitro labeled red cells. COMPARISON:  CT scan 01/31/2020 FINDINGS: At 10 minutes there is a focus of activity noted in the right abdomen in the region of the hepatic flexure. This shows progressive increased intensity. Activity then moves across the transverse colon to the splenic flexure and down into the descending colon. Findings consistent with a bleeding source in the right colon near the hepatic flexure.  IMPRESSION: Findings consistent with a bleeding source in the right colon near the hepatic flexure. Electronically Signed   By: Marijo Sanes M.D.   On: 02/01/2020 20:57   CT ABDOMEN PELVIS W CONTRAST  Result Date: 01/31/2020 CLINICAL DATA:  Abdominal distension and rectal bleeding after colonoscopy today EXAM: CT ABDOMEN AND PELVIS WITH CONTRAST TECHNIQUE: Multidetector CT imaging of  the abdomen and pelvis was performed using the standard protocol following bolus administration of intravenous contrast. CONTRAST:  16mL OMNIPAQUE IOHEXOL 300 MG/ML  SOLN COMPARISON:  01/31/2020 FINDINGS: Lower chest: No acute pleural or parenchymal lung disease. Hepatobiliary: No focal liver abnormality is seen. No gallstones, gallbladder wall thickening, or biliary dilatation. Pancreas: Unremarkable. No pancreatic ductal dilatation or surrounding inflammatory changes. Spleen: Normal in size without focal abnormality. Adrenals/Urinary Tract: Adrenal glands are unremarkable. Kidneys are normal, without renal calculi, focal lesion, or hydronephrosis. Bladder is unremarkable. Stomach/Bowel: No bowel obstruction or ileus. Normal appendix right lower quadrant. No bowel wall thickening or inflammatory change. Vascular/Lymphatic: Aortic atherosclerosis. No enlarged abdominal or pelvic lymph nodes. Reproductive: Prostate is unremarkable. Other: No free fluid or free intra-abdominal gas. No abdominal wall hernia. Musculoskeletal: No acute or destructive bony lesion. Reconstructed images demonstrate no additional findings. IMPRESSION: 1. No evidence of complication after colonoscopy. No bowel perforation. 2. No acute intra-abdominal or intrapelvic process. 3.  Aortic Atherosclerosis (ICD10-I70.0). Electronically Signed   By: Randa Ngo M.D.   On: 01/31/2020 19:26   IR Angiogram Visceral Selective  Result Date: 02/02/2020 INDICATION: Acute lower GI bleeding, positive nuclear medicine blood scan and positive abdominal pelvis CTA demonstrating cecal bleeding at a resected polyps site where there is an endo clip. EXAM: Ultrasound guidance for vascular access Celiac and SMA catheterizations and angiograms Three additional peripheral right ileocolic and right colic micro catheterization and angiograms Successful micro coil embolization of 2 peripheral branches of the right colic vasculature (to the cecal angio dysplasia  distribution) MEDICATIONS: 1% lidocaine local. The antibiotic was administered within 1 hour of the procedure ANESTHESIA/SEDATION: Moderate (conscious) sedation was employed during this procedure. A total of Versed 1.5 mg and Fentanyl 575 mcg was administered intravenously. Moderate Sedation Time: 60 minutes. The patient's level of consciousness and vital signs were monitored continuously by radiology nursing throughout the procedure under my direct supervision. CONTRAST:  85 cc omni 300 FLUOROSCOPY TIME:  Fluoroscopy Time: 11 minutes 54 seconds (1,835 mGy). COMPLICATIONS: None immediate. PROCEDURE: Informed consent was obtained from the patient following explanation of the procedure, risks, benefits and alternatives. The patient understands, agrees and consents for the procedure. All questions were addressed. A time out was performed prior to the initiation of the procedure. Maximal barrier sterile technique utilized including caps, mask, sterile gowns, sterile gloves, large sterile drape, hand hygiene, and Betadine prep. Under sterile conditions and local anesthesia, ultrasound micropuncture access performed of the right common femoral artery. Images obtained for documentation of the patent right common femoral artery. Five French sheath inserted a Bentson guidewire. C2 catheter utilized to initially select the celiac origin. Celiac angiogram performed. Celiac: The celiac origin is widely patent including its branches. Specifically the splenic, left gastric, hepatic and gastroduodenal vasculature all patent. No active bleeding in the celiac territory. Catheter was retracted and utilized to select the SMA. Several SMA angiograms were performed. Initial SMA: Atherosclerotic changes noted but the SMA is patent. The jejunal and colic branches are patent throughout the central mesentery. Limited assessment of the peripheral right lower quadrant colic branches. For better visualization, a Renegade STC catheter  and a  single angle GT glide wire were utilized to advance the access peripherally initially into the far distal aspect of the distal SMA. Peripheral distal SMA angiogram performed. Peripheral SMA: Peripheral SMA branches are patent supplying small bowel in the right lower quadrant. Normal vascularity to the small bowel in this region. No evidence of active contrast extravasation or bleeding. Microcatheter was retracted and utilized to select the ileal colic common trunk. Additional peripheral angiography report. Ileocolic common trunk: Right ileocolic trunk branches are patent and supply the cecal area. 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 Renegade STC catheter was advanced over a double angled glidewire more peripherally into a right colic branch. Peripheral right colic angiogram performed. 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 Peripheral right colic branch micro coil embolization. 2 mm interlock coils ranging in length from 4-6 cm were successfully deployed in the peripheral right colic branch. Following embolization, there is less visualization of the hypervascularity in the cecal area with preservation of the marginal branch arterial supply to the colon. Microcatheter was retracted into the main right colic branch. Repeat angiogram again confirms additional branches supplying the tortuous hypervascular cecal area with the early draining vein remaining. 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 Microcatheter was advanced into a inferior right colic peripheral branch directed toward the endo clip site. Second peripheral right colic branch angiogram performed. Second inferior peripheral right colic branch: This right colic branch also  demonstrates arterial supply to the tortuous hypervascular area of the cecum with an early draining vein again compatible with angiodysplasia. Second inferior peripheral right colic branch micro coil embolization: Additional 2 mm x 6 cm micro coil was deployed within this second inferior right colic branch. Microcatheter again was retracted more proximal into the main right colic branch. Repeat right colic angiogram performed. Right colic angiogram: Following embolization of the 2 peripheral right colic branches, there is less arterial supply to the hypervascular cecal abnormality with the early draining vein less apparent. There is preserved marginal branch arterial supply to the cecum. At this point, the access was removed. Hemostasis obtained at the right common femoral artery access site with the ExoSeal device. No immediate complication. Patient tolerated the procedure well. 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 Following embolization there is less arterial vascular supply to the cecal angio dysplasia with preserved marginal branch supply to the colon. Electronically Signed   By: Jerilynn Mages.  Shick M.D.   On: 02/02/2020 13:35   IR Angiogram Visceral Selective  Result Date: 02/02/2020 INDICATION: Acute lower GI bleeding, positive nuclear medicine blood scan and positive abdominal pelvis CTA demonstrating cecal bleeding at a resected polyps site where there is an endo clip. EXAM: Ultrasound guidance for vascular access Celiac and SMA catheterizations and angiograms Three additional peripheral right ileocolic and right colic micro catheterization and angiograms Successful micro coil embolization of 2 peripheral branches of the right colic vasculature (to the cecal angio dysplasia distribution) MEDICATIONS: 1% lidocaine local. The antibiotic was administered within 1 hour of the  procedure ANESTHESIA/SEDATION: Moderate (conscious) sedation was employed during this procedure. A total of Versed 1.5 mg and Fentanyl 575 mcg was administered intravenously. Moderate Sedation Time: 60 minutes. The patient's level of  consciousness and vital signs were monitored continuously by radiology nursing throughout the procedure under my direct supervision. CONTRAST:  85 cc omni 300 FLUOROSCOPY TIME:  Fluoroscopy Time: 11 minutes 54 seconds (1,835 mGy). COMPLICATIONS: None immediate. PROCEDURE: Informed consent was obtained from the patient following explanation of the procedure, risks, benefits and alternatives. The patient understands, agrees and consents for the procedure. All questions were addressed. A time out was performed prior to the initiation of the procedure. Maximal barrier sterile technique utilized including caps, mask, sterile gowns, sterile gloves, large sterile drape, hand hygiene, and Betadine prep. Under sterile conditions and local anesthesia, ultrasound micropuncture access performed of the right common femoral artery. Images obtained for documentation of the patent right common femoral artery. Five French sheath inserted a Bentson guidewire. C2 catheter utilized to initially select the celiac origin. Celiac angiogram performed. Celiac: The celiac origin is widely patent including its branches. Specifically the splenic, left gastric, hepatic and gastroduodenal vasculature all patent. No active bleeding in the celiac territory. Catheter was retracted and utilized to select the SMA. Several SMA angiograms were performed. Initial SMA: Atherosclerotic changes noted but the SMA is patent. The jejunal and colic branches are patent throughout the central mesentery. Limited assessment of the peripheral right lower quadrant colic branches. For better visualization, a Renegade STC catheter and a single angle GT glide wire were utilized to advance the access peripherally initially into the far  distal aspect of the distal SMA. Peripheral distal SMA angiogram performed. Peripheral SMA: Peripheral SMA branches are patent supplying small bowel in the right lower quadrant. Normal vascularity to the small bowel in this region. No evidence of active contrast extravasation or bleeding. Microcatheter was retracted and utilized to select the ileal colic common trunk. Additional peripheral angiography report. Ileocolic common trunk: Right ileocolic trunk branches are patent and supply the cecal area. 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 Renegade STC catheter was advanced over a double angled glidewire more peripherally into a right colic branch. Peripheral right colic angiogram performed. 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 Peripheral right colic branch micro coil embolization. 2 mm interlock coils ranging in length from 4-6 cm were successfully deployed in the peripheral right colic branch. Following embolization, there is less visualization of the hypervascularity in the cecal area with preservation of the marginal branch arterial supply to the colon. Microcatheter was retracted into the main right colic branch. Repeat angiogram again confirms additional branches supplying the tortuous hypervascular cecal area with the early draining vein remaining. 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 Microcatheter was advanced into a inferior right colic peripheral branch directed toward the endo clip site. Second peripheral right colic branch angiogram performed. Second inferior peripheral right colic branch: This right colic branch also demonstrates arterial supply to the tortuous hypervascular area of the cecum with an early draining  vein again compatible with angiodysplasia. Second inferior peripheral right colic branch micro coil embolization: Additional 2 mm x 6 cm micro coil was deployed within this second inferior right colic branch. Microcatheter again was retracted more proximal into the main right colic branch. Repeat right colic angiogram performed. Right colic angiogram: Following embolization of the 2 peripheral right colic branches, there is less arterial supply to the hypervascular cecal abnormality with the early draining vein less apparent. There  is preserved marginal branch arterial supply to the cecum. At this point, the access was removed. Hemostasis obtained at the right common femoral artery access site with the ExoSeal device. No immediate complication. Patient tolerated the procedure well. 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 Following embolization there is less arterial vascular supply to the cecal angio dysplasia with preserved marginal branch supply to the colon. Electronically Signed   By: Jerilynn Mages.  Shick M.D.   On: 02/02/2020 13:35   IR Angiogram Selective Each Additional Vessel  Result Date: 02/02/2020 INDICATION: Acute lower GI bleeding, positive nuclear medicine blood scan and positive abdominal pelvis CTA demonstrating cecal bleeding at a resected polyps site where there is an endo clip. EXAM: Ultrasound guidance for vascular access Celiac and SMA catheterizations and angiograms Three additional peripheral right ileocolic and right colic micro catheterization and angiograms Successful micro coil embolization of 2 peripheral branches of the right colic vasculature (to the cecal angio dysplasia distribution) MEDICATIONS: 1% lidocaine local. The antibiotic was administered within 1 hour of the procedure ANESTHESIA/SEDATION: Moderate (conscious) sedation was employed during this  procedure. A total of Versed 1.5 mg and Fentanyl 575 mcg was administered intravenously. Moderate Sedation Time: 60 minutes. The patient's level of consciousness and vital signs were monitored continuously by radiology nursing throughout the procedure under my direct supervision. CONTRAST:  85 cc omni 300 FLUOROSCOPY TIME:  Fluoroscopy Time: 11 minutes 54 seconds (1,835 mGy). COMPLICATIONS: None immediate. PROCEDURE: Informed consent was obtained from the patient following explanation of the procedure, risks, benefits and alternatives. The patient understands, agrees and consents for the procedure. All questions were addressed. A time out was performed prior to the initiation of the procedure. Maximal barrier sterile technique utilized including caps, mask, sterile gowns, sterile gloves, large sterile drape, hand hygiene, and Betadine prep. Under sterile conditions and local anesthesia, ultrasound micropuncture access performed of the right common femoral artery. Images obtained for documentation of the patent right common femoral artery. Five French sheath inserted a Bentson guidewire. C2 catheter utilized to initially select the celiac origin. Celiac angiogram performed. Celiac: The celiac origin is widely patent including its branches. Specifically the splenic, left gastric, hepatic and gastroduodenal vasculature all patent. No active bleeding in the celiac territory. Catheter was retracted and utilized to select the SMA. Several SMA angiograms were performed. Initial SMA: Atherosclerotic changes noted but the SMA is patent. The jejunal and colic branches are patent throughout the central mesentery. Limited assessment of the peripheral right lower quadrant colic branches. For better visualization, a Renegade STC catheter and a single angle GT glide wire were utilized to advance the access peripherally initially into the far distal aspect of the distal SMA. Peripheral distal SMA angiogram performed. Peripheral  SMA: Peripheral SMA branches are patent supplying small bowel in the right lower quadrant. Normal vascularity to the small bowel in this region. No evidence of active contrast extravasation or bleeding. Microcatheter was retracted and utilized to select the ileal colic common trunk. Additional peripheral angiography report. Ileocolic common trunk: Right ileocolic trunk branches are patent and supply the cecal area. 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 Renegade STC catheter was advanced over a double angled glidewire more peripherally into a right colic branch. Peripheral right colic angiogram performed. Peripheral right colic branch: Tortuous hypervascularity present in the cecal area just  superior to the endo clip with an early draining vein compatible with angio dysplasia angiographically. Peripheral right colic branch micro coil embolization. 2 mm interlock coils ranging in length from 4-6 cm were successfully deployed in the peripheral right colic branch. Following embolization, there is less visualization of the hypervascularity in the cecal area with preservation of the marginal branch arterial supply to the colon. Microcatheter was retracted into the main right colic branch. Repeat angiogram again confirms additional branches supplying the tortuous hypervascular cecal area with the early draining vein remaining. 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 Microcatheter was advanced into a inferior right colic peripheral branch directed toward the endo clip site. Second peripheral right colic branch angiogram performed. Second inferior peripheral right colic branch: This right colic branch also demonstrates arterial supply to the tortuous hypervascular area of the cecum with an early draining vein again compatible with angiodysplasia. Second inferior peripheral right colic branch  micro coil embolization: Additional 2 mm x 6 cm micro coil was deployed within this second inferior right colic branch. Microcatheter again was retracted more proximal into the main right colic branch. Repeat right colic angiogram performed. Right colic angiogram: Following embolization of the 2 peripheral right colic branches, there is less arterial supply to the hypervascular cecal abnormality with the early draining vein less apparent. There is preserved marginal branch arterial supply to the cecum. At this point, the access was removed. Hemostasis obtained at the right common femoral artery access site with the ExoSeal device. No immediate complication. Patient tolerated the procedure well. 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 Following embolization there is less arterial vascular supply to the cecal angio dysplasia with preserved marginal branch supply to the colon. Electronically Signed   By: Jerilynn Mages.  Shick M.D.   On: 02/02/2020 13:35   IR Angiogram Selective Each Additional Vessel  Result Date: 02/02/2020 INDICATION: Acute lower GI bleeding, positive nuclear medicine blood scan and positive abdominal pelvis CTA demonstrating cecal bleeding at a resected polyps site where there is an endo clip. EXAM: Ultrasound guidance for vascular access Celiac and SMA catheterizations and angiograms Three additional peripheral right ileocolic and right colic micro catheterization and angiograms Successful micro coil embolization of 2 peripheral branches of the right colic vasculature (to the cecal angio dysplasia distribution) MEDICATIONS: 1% lidocaine local. The antibiotic was administered within 1 hour of the procedure ANESTHESIA/SEDATION: Moderate (conscious) sedation was employed during this procedure. A total of Versed 1.5 mg and Fentanyl 575 mcg was administered intravenously.  Moderate Sedation Time: 60 minutes. The patient's level of consciousness and vital signs were monitored continuously by radiology nursing throughout the procedure under my direct supervision. CONTRAST:  85 cc omni 300 FLUOROSCOPY TIME:  Fluoroscopy Time: 11 minutes 54 seconds (1,835 mGy). COMPLICATIONS: None immediate. PROCEDURE: Informed consent was obtained from the patient following explanation of the procedure, risks, benefits and alternatives. The patient understands, agrees and consents for the procedure. All questions were addressed. A time out was performed prior to the initiation of the procedure. Maximal barrier sterile technique utilized including caps, mask, sterile gowns, sterile gloves, large sterile drape, hand hygiene, and Betadine prep. Under sterile conditions and local anesthesia, ultrasound micropuncture access performed of the right common femoral artery. Images obtained for documentation of the patent right common femoral artery. Five French sheath inserted a Bentson guidewire. C2 catheter utilized to initially select the celiac origin. Celiac angiogram performed. Celiac:  The celiac origin is widely patent including its branches. Specifically the splenic, left gastric, hepatic and gastroduodenal vasculature all patent. No active bleeding in the celiac territory. Catheter was retracted and utilized to select the SMA. Several SMA angiograms were performed. Initial SMA: Atherosclerotic changes noted but the SMA is patent. The jejunal and colic branches are patent throughout the central mesentery. Limited assessment of the peripheral right lower quadrant colic branches. For better visualization, a Renegade STC catheter and a single angle GT glide wire were utilized to advance the access peripherally initially into the far distal aspect of the distal SMA. Peripheral distal SMA angiogram performed. Peripheral SMA: Peripheral SMA branches are patent supplying small bowel in the right lower quadrant.  Normal vascularity to the small bowel in this region. No evidence of active contrast extravasation or bleeding. Microcatheter was retracted and utilized to select the ileal colic common trunk. Additional peripheral angiography report. Ileocolic common trunk: Right ileocolic trunk branches are patent and supply the cecal area. 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 Renegade STC catheter was advanced over a double angled glidewire more peripherally into a right colic branch. Peripheral right colic angiogram performed. 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 Peripheral right colic branch micro coil embolization. 2 mm interlock coils ranging in length from 4-6 cm were successfully deployed in the peripheral right colic branch. Following embolization, there is less visualization of the hypervascularity in the cecal area with preservation of the marginal branch arterial supply to the colon. Microcatheter was retracted into the main right colic branch. Repeat angiogram again confirms additional branches supplying the tortuous hypervascular cecal area with the early draining vein remaining. 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 Microcatheter was advanced into a inferior right colic peripheral branch directed toward the endo clip site. Second peripheral right colic branch angiogram performed. Second inferior peripheral right colic branch: This right colic branch also demonstrates arterial supply to the tortuous hypervascular area of the cecum with an early draining vein again compatible with angiodysplasia. Second inferior peripheral right colic branch micro coil embolization: Additional 2 mm x 6 cm micro coil was deployed within this second  inferior right colic branch. Microcatheter again was retracted more proximal into the main right colic branch. Repeat right colic angiogram performed. Right colic angiogram: Following embolization of the 2 peripheral right colic branches, there is less arterial supply to the hypervascular cecal abnormality with the early draining vein less apparent. There is preserved marginal branch arterial supply to the cecum. At this point, the access was removed. Hemostasis obtained at the right common femoral artery access site with the ExoSeal device. No immediate complication. Patient tolerated the procedure well. 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 Following embolization there is less arterial vascular supply to the cecal angio dysplasia with preserved marginal branch supply to the colon. Electronically Signed   By: Jerilynn Mages.  Shick M.D.   On: 02/02/2020 13:35   IR Angiogram Selective Each Additional Vessel  Result Date: 02/02/2020 INDICATION: Acute lower GI bleeding, positive nuclear medicine blood scan and positive abdominal pelvis CTA demonstrating cecal bleeding at a resected polyps site where there is an endo clip. EXAM: Ultrasound guidance for vascular access Celiac and  SMA catheterizations and angiograms Three additional peripheral right ileocolic and right colic micro catheterization and angiograms Successful micro coil embolization of 2 peripheral branches of the right colic vasculature (to the cecal angio dysplasia distribution) MEDICATIONS: 1% lidocaine local. The antibiotic was administered within 1 hour of the procedure ANESTHESIA/SEDATION: Moderate (conscious) sedation was employed during this procedure. A total of Versed 1.5 mg and Fentanyl 575 mcg was administered intravenously. Moderate Sedation Time: 60 minutes. The patient's level of consciousness and vital signs were  monitored continuously by radiology nursing throughout the procedure under my direct supervision. CONTRAST:  85 cc omni 300 FLUOROSCOPY TIME:  Fluoroscopy Time: 11 minutes 54 seconds (1,835 mGy). COMPLICATIONS: None immediate. PROCEDURE: Informed consent was obtained from the patient following explanation of the procedure, risks, benefits and alternatives. The patient understands, agrees and consents for the procedure. All questions were addressed. A time out was performed prior to the initiation of the procedure. Maximal barrier sterile technique utilized including caps, mask, sterile gowns, sterile gloves, large sterile drape, hand hygiene, and Betadine prep. Under sterile conditions and local anesthesia, ultrasound micropuncture access performed of the right common femoral artery. Images obtained for documentation of the patent right common femoral artery. Five French sheath inserted a Bentson guidewire. C2 catheter utilized to initially select the celiac origin. Celiac angiogram performed. Celiac: The celiac origin is widely patent including its branches. Specifically the splenic, left gastric, hepatic and gastroduodenal vasculature all patent. No active bleeding in the celiac territory. Catheter was retracted and utilized to select the SMA. Several SMA angiograms were performed. Initial SMA: Atherosclerotic changes noted but the SMA is patent. The jejunal and colic branches are patent throughout the central mesentery. Limited assessment of the peripheral right lower quadrant colic branches. For better visualization, a Renegade STC catheter and a single angle GT glide wire were utilized to advance the access peripherally initially into the far distal aspect of the distal SMA. Peripheral distal SMA angiogram performed. Peripheral SMA: Peripheral SMA branches are patent supplying small bowel in the right lower quadrant. Normal vascularity to the small bowel in this region. No evidence of active contrast  extravasation or bleeding. Microcatheter was retracted and utilized to select the ileal colic common trunk. Additional peripheral angiography report. Ileocolic common trunk: Right ileocolic trunk branches are patent and supply the cecal area. 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 Renegade STC catheter was advanced over a double angled glidewire more peripherally into a right colic branch. Peripheral right colic angiogram performed. 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 Peripheral right colic branch micro coil embolization. 2 mm interlock coils ranging in length from 4-6 cm were successfully deployed in the peripheral right colic branch. Following embolization, there is less visualization of the hypervascularity in the cecal area with preservation of the marginal branch arterial supply to the colon. Microcatheter was retracted into the main right colic branch. Repeat angiogram again confirms additional branches supplying the tortuous hypervascular cecal area with the early draining vein remaining. 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 Microcatheter was advanced into a inferior right colic peripheral branch directed toward the endo clip site. Second peripheral right colic branch angiogram performed. Second inferior peripheral right colic branch: This right colic branch also demonstrates arterial supply to the tortuous hypervascular area of the cecum with an  early draining vein again compatible with angiodysplasia. Second inferior peripheral right colic branch micro coil embolization: Additional 2 mm x 6 cm micro coil was deployed within this second inferior right colic branch. Microcatheter again was retracted more proximal into the  main right colic branch. Repeat right colic angiogram performed. Right colic angiogram: Following embolization of the 2 peripheral right colic branches, there is less arterial supply to the hypervascular cecal abnormality with the early draining vein less apparent. There is preserved marginal branch arterial supply to the cecum. At this point, the access was removed. Hemostasis obtained at the right common femoral artery access site with the ExoSeal device. No immediate complication. Patient tolerated the procedure well. 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 Following embolization there is less arterial vascular supply to the cecal angio dysplasia with preserved marginal branch supply to the colon. Electronically Signed   By: Jerilynn Mages.  Shick M.D.   On: 02/02/2020 13:35   IR US Guide Vasc Access Right  Result Date: 02/02/2020 INDICATION: Acute lower GI bleeding, positive nuclear medicine blood scan and positive abdominal pelvis CTA demonstrating cecal bleeding at a resected polyps site where there is an endo clip. EXAM: Ultrasound guidance for vascular access Celiac and SMA catheterizations and angiograms Three additional peripheral right ileocolic and right colic micro catheterization and angiograms Successful micro coil embolization of 2 peripheral branches of the right colic vasculature (to the cecal angio dysplasia distribution) MEDICATIONS: 1% lidocaine local. The antibiotic was administered within 1 hour of the procedure ANESTHESIA/SEDATION: Moderate (conscious) sedation was employed during this procedure. A total of Versed 1.5 mg and Fentanyl 575 mcg was administered intravenously. Moderate Sedation Time: 60 minutes. The patient's level of consciousness and vital signs were monitored continuously by radiology nursing throughout the procedure under my direct supervision.  CONTRAST:  85 cc omni 300 FLUOROSCOPY TIME:  Fluoroscopy Time: 11 minutes 54 seconds (1,835 mGy). COMPLICATIONS: None immediate. PROCEDURE: Informed consent was obtained from the patient following explanation of the procedure, risks, benefits and alternatives. The patient understands, agrees and consents for the procedure. All questions were addressed. A time out was performed prior to the initiation of the procedure. Maximal barrier sterile technique utilized including caps, mask, sterile gowns, sterile gloves, large sterile drape, hand hygiene, and Betadine prep. Under sterile conditions and local anesthesia, ultrasound micropuncture access performed of the right common femoral artery. Images obtained for documentation of the patent right common femoral artery. Five French sheath inserted a Bentson guidewire. C2 catheter utilized to initially select the celiac origin. Celiac angiogram performed. Celiac: The celiac origin is widely patent including its branches. Specifically the splenic, left gastric, hepatic and gastroduodenal vasculature all patent. No active bleeding in the celiac territory. Catheter was retracted and utilized to select the SMA. Several SMA angiograms were performed. Initial SMA: Atherosclerotic changes noted but the SMA is patent. The jejunal and colic branches are patent throughout the central mesentery. Limited assessment of the peripheral right lower quadrant colic branches. For better visualization, a Renegade STC catheter and a single angle GT glide wire were utilized to advance the access peripherally initially into the far distal aspect of the distal SMA. Peripheral distal SMA angiogram performed. Peripheral SMA: Peripheral SMA branches are patent supplying small bowel in the right lower quadrant. Normal vascularity to the small bowel in this region. No evidence of active contrast extravasation or bleeding. Microcatheter was retracted and utilized to  select the ileal colic common trunk.  Additional peripheral angiography report. Ileocolic common trunk: Right ileocolic trunk branches are patent and supply the cecal area. 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 Renegade STC catheter was advanced over a double angled glidewire more peripherally into a right colic branch. Peripheral right colic angiogram performed. 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 Peripheral right colic branch micro coil embolization. 2 mm interlock coils ranging in length from 4-6 cm were successfully deployed in the peripheral right colic branch. Following embolization, there is less visualization of the hypervascularity in the cecal area with preservation of the marginal branch arterial supply to the colon. Microcatheter was retracted into the main right colic branch. Repeat angiogram again confirms additional branches supplying the tortuous hypervascular cecal area with the early draining vein remaining. 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 Microcatheter was advanced into a inferior right colic peripheral branch directed toward the endo clip site. Second peripheral right colic branch angiogram performed. Second inferior peripheral right colic branch: This right colic branch also demonstrates arterial supply to the tortuous hypervascular area of the cecum with an early draining vein again compatible with angiodysplasia. Second inferior peripheral right colic branch micro coil embolization: Additional 2 mm x 6 cm micro coil was deployed within this second inferior right colic branch. Microcatheter again was retracted more proximal into the main right colic branch. Repeat right colic angiogram performed. Right colic angiogram: Following  embolization of the 2 peripheral right colic branches, there is less arterial supply to the hypervascular cecal abnormality with the early draining vein less apparent. There is preserved marginal branch arterial supply to the cecum. At this point, the access was removed. Hemostasis obtained at the right common femoral artery access site with the ExoSeal device. No immediate complication. Patient tolerated the procedure well. 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 Following embolization there is less arterial vascular supply to the cecal angio dysplasia with preserved marginal branch supply to the colon. Electronically Signed   By: Jerilynn Mages.  Shick M.D.   On: 02/02/2020 13:35   DG Chest Port 1 View  Result Date: 01/31/2020 CLINICAL DATA:  Abdominal pain and rectal bleeding after colonoscopy EXAM: PORTABLE CHEST 1 VIEW COMPARISON:  03/16/2016 FINDINGS: 2 frontal views of the chest demonstrate an unremarkable cardiac silhouette. No airspace disease, effusion, or pneumothorax. No evidence of free gas in the greater peritoneal sac. IMPRESSION: 1. No acute intrathoracic process. Electronically Signed   By: Randa Ngo M.D.   On: 01/31/2020 19:43   DG Abd Portable 1 View  Result Date: 01/31/2020 CLINICAL DATA:  Abdominal pain and rectal bleeding status post colonoscopy. EXAM: PORTABLE ABDOMEN - 1 VIEW COMPARISON:  None. FINDINGS: The bowel gas pattern is normal. There is no evidence of free air. No radio-opaque calculi or other significant radiographic abnormality are seen. IMPRESSION: Negative. Electronically Signed   By: Virgina Norfolk M.D.   On: 01/31/2020 18:48   IR EMBO ART  VEN HEMORR LYMPH EXTRAV  INC GUIDE ROADMAPPING  Result Date: 02/02/2020 INDICATION: Acute lower GI bleeding, positive nuclear medicine blood scan and positive abdominal pelvis CTA demonstrating cecal  bleeding at a resected polyps site where there is an endo  clip. EXAM: Ultrasound guidance for vascular access Celiac and SMA catheterizations and angiograms Three additional peripheral right ileocolic and right colic micro catheterization and angiograms Successful micro coil embolization of 2 peripheral branches of the right colic vasculature (to the cecal angio dysplasia distribution) MEDICATIONS: 1% lidocaine local. The antibiotic was administered within 1 hour of the procedure ANESTHESIA/SEDATION: Moderate (conscious) sedation was employed during this procedure. A total of Versed 1.5 mg and Fentanyl 575 mcg was administered intravenously. Moderate Sedation Time: 60 minutes. The patient's level of consciousness and vital signs were monitored continuously by radiology nursing throughout the procedure under my direct supervision. CONTRAST:  85 cc omni 300 FLUOROSCOPY TIME:  Fluoroscopy Time: 11 minutes 54 seconds (1,835 mGy). COMPLICATIONS: None immediate. PROCEDURE: Informed consent was obtained from the patient following explanation of the procedure, risks, benefits and alternatives. The patient understands, agrees and consents for the procedure. All questions were addressed. A time out was performed prior to the initiation of the procedure. Maximal barrier sterile technique utilized including caps, mask, sterile gowns, sterile gloves, large sterile drape, hand hygiene, and Betadine prep. Under sterile conditions and local anesthesia, ultrasound micropuncture access performed of the right common femoral artery. Images obtained for documentation of the patent right common femoral artery. Five French sheath inserted a Bentson guidewire. C2 catheter utilized to initially select the celiac origin. Celiac angiogram performed. Celiac: The celiac origin is widely patent including its branches. Specifically the splenic, left gastric, hepatic and gastroduodenal vasculature all patent. No active bleeding in the celiac  territory. Catheter was retracted and utilized to select the SMA. Several SMA angiograms were performed. Initial SMA: Atherosclerotic changes noted but the SMA is patent. The jejunal and colic branches are patent throughout the central mesentery. Limited assessment of the peripheral right lower quadrant colic branches. For better visualization, a Renegade STC catheter and a single angle GT glide wire were utilized to advance the access peripherally initially into the far distal aspect of the distal SMA. Peripheral distal SMA angiogram performed. Peripheral SMA: Peripheral SMA branches are patent supplying small bowel in the right lower quadrant. Normal vascularity to the small bowel in this region. No evidence of active contrast extravasation or bleeding. Microcatheter was retracted and utilized to select the ileal colic common trunk. Additional peripheral angiography report. Ileocolic common trunk: Right ileocolic trunk branches are patent and supply the cecal area. 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 Renegade STC catheter was advanced over a double angled glidewire more peripherally into a right colic branch. Peripheral right colic angiogram performed. 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 Peripheral right colic branch micro coil embolization. 2 mm interlock coils ranging in length from 4-6 cm were successfully deployed in the peripheral right colic branch. Following embolization, there is less visualization of the hypervascularity in the cecal area with preservation of the marginal branch arterial supply to the colon. Microcatheter was retracted into the main right colic branch. Repeat angiogram again confirms additional branches supplying the  tortuous hypervascular cecal area with the early draining vein remaining. 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 Microcatheter was advanced into a inferior right colic peripheral branch directed toward the endo clip site. Second peripheral right colic branch angiogram performed. Second inferior peripheral right colic branch: This right colic branch also demonstrates arterial supply to  the tortuous hypervascular area of the cecum with an early draining vein again compatible with angiodysplasia. Second inferior peripheral right colic branch micro coil embolization: Additional 2 mm x 6 cm micro coil was deployed within this second inferior right colic branch. Microcatheter again was retracted more proximal into the main right colic branch. Repeat right colic angiogram performed. Right colic angiogram: Following embolization of the 2 peripheral right colic branches, there is less arterial supply to the hypervascular cecal abnormality with the early draining vein less apparent. There is preserved marginal branch arterial supply to the cecum. At this point, the access was removed. Hemostasis obtained at the right common femoral artery access site with the ExoSeal device. No immediate complication. Patient tolerated the procedure well. 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 Following embolization there is less arterial vascular supply to the cecal angio dysplasia with preserved marginal branch supply to the colon. Electronically Signed   By: Jerilynn Mages.  Shick M.D.   On: 02/02/2020 13:35   CT Angio Abd/Pel w/ and/or w/o  Result Date: 02/01/2020 CLINICAL DATA:  GI bleed. Colonoscopy yesterday with a polyps removed. EXAM: CTA ABDOMEN AND PELVIS WITHOUT AND WITH CONTRAST TECHNIQUE: Multidetector CT imaging of the abdomen and pelvis was performed using the standard protocol  during bolus administration of intravenous contrast. Multiplanar reconstructed images and MIPs were obtained and reviewed to evaluate the vascular anatomy. CONTRAST:  115mL OMNIPAQUE IOHEXOL 350 MG/ML SOLN COMPARISON:  GI bleeding scan earlier today with active bleeding at the hepatic flexure. Abdominal CT yesterday. FINDINGS: VASCULAR Aorta: Moderate atherosclerosis. Calcified and noncalcified plaque with irregular noncalcified plaque anteriorly. 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 No dissection or vasculitis. Celiac: Patent without evidence of aneurysm, dissection, vasculitis or significant stenosis. SMA: Patent without evidence of aneurysm, dissection, vasculitis or significant stenosis. Renals: 2 right and single left renal arteries. Plaque at the origin of all renal arteries without significant stenosis. No acute findings or FMD. IMA: Patent. Inflow: Moderately calcified and tortuous. No stenosis, dissection or acute findings. Proximal Outflow: Moderately calcified. 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 Mild to moderate stenosis involving the proximal right common femoral artery. No acute findings. Veins: Venous phase imaging demonstrates patent portal vein. No evidence of mesenteric venous thrombus. No acute venous abnormality. Circumaortic left renal vein. Review of the MIP images confirms the above findings. NON-VASCULAR Lower chest: No acute findings. No pleural fluid or consolidation. Thin walled cysts in the lower lobes. Heart is normal in size. Coronary artery calcifications. Hepatobiliary: No focal liver abnormality is seen. Minimal layering stones or sludge in the gallbladder without pericholecystic inflammation. Pancreas: No ductal dilatation or inflammation. Spleen: Normal in size without focal abnormality. Adrenals/Urinary Tract: Normal adrenal glands. No hydronephrosis or perinephric edema. Homogeneous renal enhancement. Small  low-density lesions in both kidneys are too small to characterize. Urinary bladder is physiologically distended without wall thickening. Stomach/Bowel: There is active extravasation of contrast into the GI track in the cecum adjacent to a surgical clip. This is seen on arterial axial images series 6, images 79 and 80, and persist on venous phase, series 8, images 48 and 49. Liquid contents in the ascending and hepatic flexure of the colon. There are scattered ascending colonic diverticula without diverticulitis. Fluid in the transverse and descending colon. Mild distal colonic diverticulosis without diverticulitis. Unremarkable stomach and small bowel. Normal appendix. Lymphatic: No  abdominopelvic adenopathy. Reproductive: Prostate is unremarkable. Other: No ascites or free air. There is fat in both inguinal canals. Musculoskeletal: There are no acute or suspicious osseous abnormalities. IMPRESSION: 1. Positive for active extravasation of contrast into the GI track in the cecum adjacent to a surgical clip consistent with acute GI bleed. This correlates to the site of active bleeding on GI bleeding scan earlier today, the cecum is slightly high-riding in the mid abdomen. 2. Aortic atherosclerosis. 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 Recommend follow-up every 3 years. This recommendation follows ACR consensus guidelines: White Paper of the ACR Incidental Findings Committee II on Vascular Findings. J Am Coll Radiol 2013; 10:789-794. 3. Colonic diverticulosis without diverticulitis. 4. Minimal layering stones or sludge in the gallbladder without gallbladder inflammation. 5. 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 Aortic Atherosclerosis (ICD10-I70.0). These results will be called to the ordering clinician or representative by the Radiologist Assistant, and communication documented in the PACS or [employer]. Electronically Signed   By: Keith Rake M.D.   On: 02/01/2020 22:38    Labs:  CBC: Recent Labs    01/31/20 1734 01/31/20 1734 02/01/20 0219 02/01/20 2144 02/03/20 0527 02/03/20 1928  WBC 15.6*  --  9.7 7.4  --  7.2  HGB 11.7*   < > 10.8* 8.9* 5.7* 8.4*  HCT 38.4*   < > 32.3* 28.6* 17.9* 24.9*  PLT 310  --  155 186  --  166   < > = values in this interval not displayed.    COAGS: No results for input(s): INR, APTT in the last 8760 hours.  BMP: Recent Labs    01/31/20 1734 02/01/20 0219  NA 138 137  K 4.0 4.2  CL 103 104  CO2 23 23  GLUCOSE 230* 127*  BUN 13 11  CALCIUM 9.0 8.2*  CREATININE 1.00 0.89  GFRNONAA >60 >60  GFRAA >60 >60    LIVER FUNCTION TESTS: Recent Labs    01/31/20 1734  BILITOT 0.9  AST 20  ALT 18  ALKPHOS 26*  PROT 6.4*  ALBUMIN 3.4*    Assessment and Plan: Pt with hx acute LGI bleeding; s/p micro coil embolization of 2 peripheral right colic branches to the angio dysplasia vasculature 8/19; s/p colonoscopy 8/20 with endoclipping of prior polypectomy sites;  afebrile; no new labs today available; further plans as per GI/TRH  Electronically Signed: D. Rowe Robert, PA-C 02/04/2020, 9:55 AM   I spent a total of 15 minutes at the the patient's bedside AND on the patient's hospital floor or unit, greater than 50% of which was counseling/coordinating care for visceral arteriogram with endovascular coiling     [name], male   DOB: 21-Jun-1950, 69 y.o.   MRN: [id number]

## 2020-02-04 NOTE — Progress Notes (Signed)
PROGRESS NOTE    Jonathan Tucker  WVP:[id number] DOB: 1951-03-20 DOA: 01/31/2020 PCP: Lennie Odor, PA     Brief Narrative:  Jonathan Tucker is a 69 y.o. male with history of A. fib, DM type II without complication, HTN, HLD,,    Brought to the ER after patient had multiple episodes of frank rectal bleeding.  Patient had a colonoscopy done this morning and had 8 polyps removed.  At around 2 PM patient started the pain frank rectal bleeding with abdominal discomfort.  Patient presents to the ER.  ED Course: While waiting in the ER patient had a syncopal episode did not strike his head.  Did not have any chest pain.  Patient was found to be hypotensive in the 54O systolic was given fluid bolus following which blood pressure improved.  Hemoglobin is around 11.7.  The last one in our system was around 11.4 in 2017.  Dr. Michail Sermon on-call gastroenterologist was consulted.  CT abdomen pelvis did not show anything acute.  Covid test was negative.  Plan is to take patient to urgent colonoscopy.  EKG shows sinus tachycardia.  2 units of PRBC transfusion has been ordered.    Subjective: A/O x4, negative CP, positive mild abdominal pain.  Had first BM today without obvious signs of blood.   Assessment & Plan: Covid vaccination; vaccinated   Principal Problem:   Acute GI bleeding Active Problems:   Uncontrolled diabetes mellitus ([place])   COPD (chronic obstructive pulmonary disease) ([place])   Essential hypertension   PAF (paroxysmal atrial fibrillation) (HCC)   Acute blood loss anemia   GI bleed   Hemorrhagic shock (HCC)   Acute GI bleeding: -S/p colonoscopy; showed ulcers in the cecum. -8/17 2 units PRBC -8/20 2 units PRBC   GI related that she might need another colonoscopy before she is discharged Status post angiogram with embolization on 02/01/2020, after this he had multiple bloody bowel movements overnight his hemoglobin came down to 5.7, he was transfused an additional 3 units of packed  red blood cells.  CBC posttransfusion note, at this time his blood pressure is holding steady he is mildly tachycardic at 110. Awaiting further GI recommendations  Diverticulosis/polyps -Confirmed by colonoscopy see results below -Anticoagulation currently held  Hemorrhagic shock Status post 5 units of packed red blood cells including the ones from today, continue to monitor hemoglobin closely. Lab Results  Component Value Date   HGB 8.1 (L) 02/04/2020   HGB 8.4 (L) 02/03/2020   HGB 5.7 (LL) 02/03/2020   HGB 8.9 (L) 02/01/2020   HGB 10.8 (L) 02/01/2020  -Hemoglobin relatively stable will advance diet to full liquid diet -We will await further recommendations from GI.  Paroxysmal atrial fibrillation: Rate controlled not on anticoagulation only taking aspirin at home continue to hold. Beta-blocker is on hold due to hypotensive episode.  DM type II controlled without complication. -Patient received multiple units of PRBC so will not be able to obtain accurate hemoglobin A1c -03/14/2016 hemoglobin A1c = 6.5 -Sensitive SSI  COPD (chronic obstructive pulmonary disease) (HCC) Noted.   DVT prophylaxis: SCD Code Status: Full Family Communication:  Status is: Inpatient    Dispo: The patient is from: Home              Anticipated d/c is to: Home              Anticipated d/c date is: Anticipate discharge next 24-48 hrs.; await GI recommendations  Patient currently unstable      Consultants:    Procedures/Significant Events:  8/17 transfuse 2 unit PRBC 8/20 transfuse 2 units PRBC 8/20 colonoscopy;Diverticulosis was noted in the sigmoid and descending. - small polyp was noted in the transverse which was not removed -Ulcerated mucosa in the cecum. Clip (MR  conditional) was placed. -Ulcerated mucosa in the ascending colon. Clip (MR                            conditional) was placed. -Ulcerated mucosa in the transverse colon. Clip                             (MR conditional) was placed.  -- One 6 mm polyp in the transverse colon. Resection  not attempted.  - Diverticulosis in the sigmoid colon and in the  descending colon.  - I have personally reviewed and interpreted all radiology studies and my findings are as above.  VENTILATOR SETTINGS:    Cultures   Antimicrobials: Anti-infectives (From admission, onward)   None       Devices    LINES / TUBES:    Continuous Infusions:   Objective: Vitals:   02/03/20 2356 02/04/20 0000 02/04/20 0345 02/04/20 0754  BP:  128/66 (!) 149/73 (!) 154/81  Pulse: 90 91 92 85  Resp: 15 17 19 17   Temp:  98.5 F (36.9 C) 97.7 F (36.5 C) 97.8 F (36.6 C)  TempSrc:  Oral Oral Oral  SpO2: 97% 97% 95% 99%  Weight:      Height:        Intake/Output Summary (Last 24 hours) at 02/04/2020 0945 Last data filed at 02/04/2020 0756 Gross per 24 hour  Intake 852.33 ml  Output 1350 ml  Net -497.67 ml   Filed Weights   01/31/20 1724 02/01/20 0127  Weight: 86.2 kg 109.9 kg    Examination:  General: A/O x4, No acute respiratory distress Eyes: negative scleral hemorrhage, negative anisocoria, negative icterus ENT: Negative Runny nose, negative gingival bleeding, Neck:  Negative scars, masses, torticollis, lymphadenopathy, JVD Lungs: Clear to auscultation bilaterally without wheezes or crackles Cardiovascular: Regular rate and rhythm without murmur gallop or rub normal S1 and S2 Abdomen: OBESE, negative abdominal pain, nondistended, positive soft, bowel sounds, no rebound, no ascites, no appreciable mass Extremities: No significant cyanosis, clubbing, or edema bilateral lower extremities Skin: Negative rashes, lesions, ulcers Psychiatric:  Negative depression, negative anxiety, negative fatigue, negative mania  Central nervous system:  Cranial nerves II through XII intact, tongue/uvula midline, all extremities muscle strength 5/5, sensation intact throughout,negative dysarthria, negative  expressive aphasia, negative receptive aphasia.  .     Data Reviewed: Care during the described time interval was provided by me .  I have reviewed this patient's available data, including medical history, events of note, physical examination, and all test results as part of my evaluation.  CBC: Recent Labs  Lab 01/31/20 1734 02/01/20 0219 02/01/20 2144 02/03/20 0527 02/03/20 1928  WBC 15.6* 9.7 7.4  --  7.2  NEUTROABS  --  7.9*  --   --  5.3  HGB 11.7* 10.8* 8.9* 5.7* 8.4*  HCT 38.4* 32.3* 28.6* 17.9* 24.9*  MCV 95.5 89.7 91.7  --  89.9  PLT 310 155 186  --  824   Basic Metabolic Panel: Recent Labs  Lab 01/31/20 1734 02/01/20 0219  NA 138 137  K 4.0 4.2  CL 103 104  CO2 23 23  GLUCOSE 230* 127*  BUN 13 11  CREATININE 1.00 0.89  CALCIUM 9.0 8.2*   GFR: Estimated Creatinine Clearance: 97.3 mL/min (by C-G formula based on SCr of 0.89 mg/dL). Liver Function Tests: Recent Labs  Lab 01/31/20 1734  AST 20  ALT 18  ALKPHOS 26*  BILITOT 0.9  PROT 6.4*  ALBUMIN 3.4*   No results for input(s): LIPASE, AMYLASE in the last 168 hours. No results for input(s): AMMONIA in the last 168 hours. Coagulation Profile: No results for input(s): INR, PROTIME in the last 168 hours. Cardiac Enzymes: No results for input(s): CKTOTAL, CKMB, CKMBINDEX, TROPONINI in the last 168 hours. BNP (last 3 results) No results for input(s): PROBNP in the last 8760 hours. HbA1C: No results for input(s): HGBA1C in the last 72 hours. CBG: Recent Labs  Lab 02/03/20 1644 02/03/20 2140 02/04/20 0002 02/04/20 0420 02/04/20 0814  GLUCAP 137* 163* 140* 139* 167*   Lipid Profile: No results for input(s): CHOL, HDL, LDLCALC, TRIG, CHOLHDL, LDLDIRECT in the last 72 hours. Thyroid Function Tests: No results for input(s): TSH, T4TOTAL, FREET4, T3FREE, THYROIDAB in the last 72 hours. Anemia Panel: No results for input(s): VITAMINB12, FOLATE, FERRITIN, TIBC, IRON, RETICCTPCT in the last 72  hours. Sepsis Labs: No results for input(s): PROCALCITON, LATICACIDVEN in the last 168 hours.  Recent Results (from the past 240 hour(s))  SARS Coronavirus 2 by RT PCR (hospital order, performed in [facility] hospital lab) Nasopharyngeal Nasopharyngeal Swab     Status: None   Collection Time: 01/31/20  6:34 PM   Specimen: Nasopharyngeal Swab  Result Value Ref Range Status   SARS Coronavirus 2 NEGATIVE NEGATIVE Final    Comment: (NOTE) SARS-CoV-2 target nucleic acids are NOT DETECTED.  The SARS-CoV-2 RNA is generally detectable in upper and lower respiratory specimens during the acute phase of infection. The lowest concentration of SARS-CoV-2 viral copies this assay can detect is 250 copies / mL. A negative result does not preclude SARS-CoV-2 infection and should not be used as the sole basis for treatment or other patient management decisions.  A negative result may occur with improper specimen collection / handling, submission of specimen other than nasopharyngeal swab, presence of viral mutation(s) within the areas targeted by this assay, and inadequate number of viral copies (<250 copies / mL). A negative result must be combined with clinical observations, patient history, and epidemiological information.  Fact Sheet for Patients:   StrictlyIdeas.no  Fact Sheet for Healthcare Providers: BankingDealers.co.za  This test is not yet approved or  cleared by the [place] FDA and has been authorized for detection and/or diagnosis of SARS-CoV-2 by FDA under an Emergency Use Authorization (EUA).  This EUA will remain in effect (meaning this test can be used) for the duration of the COVID-19 declaration under Section 564(b)(1) of the Act, 21 U.S.C. section 360bbb-3(b)(1), unless the authorization is terminated or revoked sooner.  Performed at [facility], [place] [address] [place], [place] [address]          Radiology  Studies: No results found.      Scheduled Meds:  insulin aspart  0-6 Units Subcutaneous Q4H   Continuous Infusions:   LOS: 3 days    Time spent:40 min    Christalynn Boise, Geraldo Docker, MD [facility] Pager [phone]  If 7PM-7AM, please contact night-coverage [email or web address] Password [facility] 02/04/2020, 9:45 AM

## 2020-02-04 NOTE — Progress Notes (Signed)
Stable.  Wants to go home.  Patient indicates she has had several bowel movements today with no blood.  Hemoglobin came up appropriately following yesterday's transfusion, and has remained stable over the past 12 hours, currently 8.1.  Vital signs are normal.  Impression: Quiescent post polypectomy bleed.  Stable status post treatment of post polypectomy bleed with IR embolization and colonoscopic clipping.  Old blood seen on yesterday's colonoscopy, which has now passed out of his system.  Recommendations:  1.  I have advanced the patient's diet  2.  Repeat CBC ordered for morning  3.  Okay for discharge tomorrow if stable overnight, hemoglobin essentially stable tomorrow morning, and no evidence of further bleeding.  4.  Would recommend outpatient follow-up with his primary gastroenterologist, Dr. Clarene Essex, approximately 1 week post discharge to check stool Hemoccult and recheck hemoglobin level.  [name], M.D. Pager [phone] If no answer or after 5 PM call [phone]

## 2020-02-05 ENCOUNTER — Encounter (HOSPITAL_COMMUNITY): Payer: Self-pay | Admitting: Gastroenterology

## 2020-02-05 DIAGNOSIS — J430 Unilateral pulmonary emphysema [MacLeod's syndrome]: Secondary | ICD-10-CM

## 2020-02-05 DIAGNOSIS — K254 Chronic or unspecified gastric ulcer with hemorrhage: Secondary | ICD-10-CM

## 2020-02-05 LAB — COMPREHENSIVE METABOLIC PANEL
ALT: 16 U/L (ref 0–44)
AST: 20 U/L (ref 15–41)
Albumin: 2.9 g/dL — ABNORMAL LOW (ref 3.5–5.0)
Alkaline Phosphatase: 31 U/L — ABNORMAL LOW (ref 38–126)
Anion gap: 7 (ref 5–15)
BUN: 5 mg/dL — ABNORMAL LOW (ref 8–23)
CO2: 29 mmol/L (ref 22–32)
Calcium: 8.8 mg/dL — ABNORMAL LOW (ref 8.9–10.3)
Chloride: 102 mmol/L (ref 98–111)
Creatinine, Ser: 0.86 mg/dL (ref 0.61–1.24)
GFR calc Af Amer: >60 mL/min (ref >60)
GFR calc non Af Amer: >60 mL/min (ref >60)
Glucose, Bld: 111 mg/dL — ABNORMAL HIGH (ref 70–99)
Potassium: 3.6 mmol/L (ref 3.5–5.1)
Sodium: 138 mmol/L (ref 135–145)
Total Bilirubin: 0.5 mg/dL (ref 0.3–1.2)
Total Protein: 5.5 g/dL — ABNORMAL LOW (ref 6.5–8.1)

## 2020-02-05 LAB — GLUCOSE, CAPILLARY
Glucose-Capillary: 116 mg/dL — ABNORMAL HIGH (ref 70–99)
Glucose-Capillary: 129 mg/dL — ABNORMAL HIGH (ref 70–99)
Glucose-Capillary: 150 mg/dL — ABNORMAL HIGH (ref 70–99)

## 2020-02-05 LAB — CBC WITH DIFFERENTIAL/PLATELET
Abs Immature Granulocytes: 0.05 10*3/uL (ref 0.00–0.07)
Basophils Absolute: 0.1 10*3/uL (ref 0.0–0.1)
Basophils Relative: 1 %
Eosinophils Absolute: 0.2 10*3/uL (ref 0.0–0.5)
Eosinophils Relative: 4 %
HCT: 24.9 % — ABNORMAL LOW (ref 39.0–52.0)
Hemoglobin: 8.2 g/dL — ABNORMAL LOW (ref 13.0–17.0)
Immature Granulocytes: 1 %
Lymphocytes Relative: 23 %
Lymphs Abs: 1.2 10*3/uL (ref 0.7–4.0)
MCH: 30 pg (ref 26.0–34.0)
MCHC: 32.9 g/dL (ref 30.0–36.0)
MCV: 91.2 fL (ref 80.0–100.0)
Monocytes Absolute: 0.5 10*3/uL (ref 0.1–1.0)
Monocytes Relative: 9 %
Neutro Abs: 3.3 10*3/uL (ref 1.7–7.7)
Neutrophils Relative %: 62 %
Platelets: 217 10*3/uL (ref 150–400)
RBC: 2.73 MIL/uL — ABNORMAL LOW (ref 4.22–5.81)
RDW: 15.2 % (ref 11.5–15.5)
WBC: 5.3 10*3/uL (ref 4.0–10.5)
nRBC: 0 % (ref 0.0–0.2)

## 2020-02-05 LAB — MAGNESIUM: Magnesium: 1.6 mg/dL — ABNORMAL LOW (ref 1.7–2.4)

## 2020-02-05 LAB — PHOSPHORUS: Phosphorus: 2.9 mg/dL (ref 2.5–4.6)

## 2020-02-05 NOTE — Discharge Summary (Signed)
Physician Discharge Summary  Jonathan Tucker:[id number] DOB: 04-26-1951 DOA: 01/31/2020  PCP: Jonathan Odor, PA  Admit date: 01/31/2020 Discharge date: 02/05/2020  Time spent: 35 minutes  Recommendations for Outpatient Follow-up:   Covid vaccination; vaccinated   Principal Problem:   Acute GI bleeding Active Problems:   Uncontrolled diabetes mellitus ([place])   COPD (chronic obstructive pulmonary disease) ([place])   Essential hypertension   PAF (paroxysmal atrial fibrillation) ([facility])   Acute blood loss anemia   GI bleed   Hemorrhagic shock ([facility])   Acute GI bleeding: -S/p colonoscopy; showed ulcers in the cecum. -S/p IR coil embolectomy see below Lab Results  Component Value Date   HGB 8.2 (L) 02/05/2020   HGB 8.1 (L) 02/04/2020   HGB 8.4 (L) 02/03/2020   HGB 5.7 (LL) 02/03/2020   HGB 8.9 (L) 02/01/2020  -Hemoglobin stable.  Per GI if stable patient cleared for discharge -Per GI follow-up with Dr. Clarene Tucker in 1 week acute GI bleed.  Diverticulosis/polyps -Confirmed by colonoscopy see results below -Anticoagulation currently held  Hemorrhagic shock -8/17 2 units PRBC -8/20 3 units PRBC   Paroxysmal atrial fibrillation: -Rate controlled not on anticoagulation only taking aspirin at home continue to hold. -Metoprolol 25 mg BID  Essential HTN  -See A. fib  DM type II controlled without complication. -Patient received multiple units of PRBC so will not be able to obtain accurate hemoglobin A1c -03/14/2016 hemoglobin A1c = 6.5 -Metformin 258 mg daily -Trulicity 1.5 mg weekly -Hold remainder diabetic medication until he sees PCP -Follow-up in 1 to 2 weeks with PA Jonathan Tucker DM type II controlled without complication patient has not required the extreme amounts of insulin while hospitalized she will decide when/if to restart medication  COPD (chronic obstructive pulmonary disease) ([place]) -Noted.    Discharge Diagnoses:  Principal Problem:   Acute GI  bleeding Active Problems:   Uncontrolled diabetes mellitus (H[facility])   COPD (chronic obstructive pulmonary disease) ([place])   Essential hypertension   PAF (paroxysmal atrial fibrillation) ([facility])   Acute blood loss anemia   GI bleed   Hemorrhagic shock ([place])   Discharge Condition: Stable  Diet recommendation: Heart healthy/carb modified  Filed Weights   01/31/20 1724 02/01/20 0127  Weight: 86.2 kg 109.9 kg    History of present illness:  Jonathan Tucker a 69 y.o.malewithhistory of A. fib, DM type II without complication, HTN, HLD,,    Brought to the ER after patient had multiple episodes of frank rectal bleeding. Patient had a colonoscopy done this morning and had 8 polyps removed. At around 2 PM patient started the pain frank rectal bleeding with abdominal discomfort. Patient presents to the ER.  ED Course:While waiting in the ER patient had a syncopal episode did not strike his head. Did not have any chest pain. Patient was found to be hypotensive in the 52D systolic was given fluid bolus following which blood pressure improved. Hemoglobin is around 11.7. The last one in our system was around 11.4 in 2017. Jonathan Tucker on-call gastroenterologist was consulted. CT abdomen pelvis did not show anything acute. Covid test was negative. Plan is to take patient to urgent colonoscopy. EKG shows sinus tachycardia. 2 units of PRBC transfusion has been ordered.   Hospital Course:  See above  Procedures: 8/19; s/p colonoscopy 8/20 with endoclipping of prior polypectomy sites;  8/21 Pt with hx acute LGI bleeding; s/p micro coil embolization of 2 peripheral right colic branches to the angio dysplasia vasculature afebrile; no new labs  today available; further plans as per GI/TRH  Consultations: GI IR   Cultures  8/17 SARS coronavirus negative 8/18 HIV screen negative    Antibiotics Anti-infectives (From admission, onward)   None       Discharge Exam: Vitals:    02/04/20 2346 02/05/20 0426 02/05/20 0851 02/05/20 1118  BP: (!) 163/81 (!) 157/92 (!) 163/91 137/70  Pulse: 77 87 84 78  Resp: 16 19 18 17   Temp: 98.3 F (36.8 C) 98 F (36.7 C) 98 F (36.7 C) 98.3 F (36.8 C)  TempSrc: Oral Oral Oral Oral  SpO2: 100% 97%    Weight:      Height:        General: A/O x4, No acute respiratory distress Eyes: negative scleral hemorrhage, negative anisocoria, negative icterus ENT: Negative Runny nose, negative gingival bleeding, Neck:  Negative scars, masses, torticollis, lymphadenopathy, JVD Lungs: Clear to auscultation bilaterally without wheezes or crackles Cardiovascular: Regular rate and rhythm without murmur gallop or rub normal S1 and S2 Abdomen: OBESE, negative abdominal pain, nondistended, positive soft, bowel sounds, no rebound, no ascites, no appreciable mass  Discharge Instructions   Allergies as of 02/05/2020      Reactions   Codeine    Does not tolerate, tolerates hydrocodone    Flexeril [cyclobenzaprine]    Burning/itching   Tape Other (See Comments)   Per patient "it burns and blisters"      Medication List    STOP taking these medications   aspirin EC 325 MG tablet   hydrochlorothiazide 25 MG tablet Commonly known as: HYDRODIURIL   naproxen sodium 220 MG tablet Commonly known as: ALEVE   ramipril 10 MG capsule Commonly known as: ALTACE   senna-docusate 8.6-50 MG tablet Commonly known as: Senokot-S   sulfamethoxazole-trimethoprim 800-160 MG tablet Commonly known as: BACTRIM DS   Tresiba 100 UNIT/ML Soln Generic drug: Insulin Degludec     TAKE these medications   calcium carbonate 500 MG chewable tablet Commonly known as: TUMS - dosed in mg elemental calcium Chew 1 tablet by mouth daily as needed for indigestion or heartburn.   colesevelam 625 MG tablet Commonly known as: WELCHOL Take 1,250 mg by mouth 2 (two) times daily with a meal.   fenofibrate 145 MG tablet Commonly known as: TRICOR Take 145 mg by  mouth every evening.   metFORMIN 500 MG tablet Commonly known as: GLUCOPHAGE 1 after the largest meal   metoprolol tartrate 25 MG tablet Commonly known as: LOPRESSOR Take 1 tablet (25 mg total) by mouth 2 (two) times daily.   rosuvastatin 5 MG tablet Commonly known as: CRESTOR Take 5 mg by mouth every evening.   Trulicity 1.5 VP/7.1GG Sopn Generic drug: Dulaglutide Inject 1.5 mg into the skin once a week.      Allergies  Allergen Reactions  . Codeine     Does not tolerate, tolerates hydrocodone   . Flexeril [Cyclobenzaprine]     Burning/itching  . Tape Other (See Comments)    Per patient "it burns and blisters"    Follow-up Information    Jonathan Essex, MD. Schedule an appointment as soon as possible for a visit in 1 week(s).   Specialty: Gastroenterology Why: Per GI follow-up with Dr. Clarene Tucker in 1 week acute GI bleed. Contact information: [address] [place] [address] [phone]        Jonathan Tucker, [place]. Schedule an appointment as soon as possible for a visit in 2 week(s).   Specialty: Physician Assistant  Why: Follow-up in 1 to 2 weeks with PA Jonathan Tucker DM type II controlled without complication patient has not required the extreme amounts of insulin while hospitalized she will decide when/if to restart medication   Contact information: [address] [place] [address] [phone]                The results of significant diagnostics from this hospitalization (including imaging, microbiology, ancillary and laboratory) are listed below for reference.    Significant Diagnostic Studies: NM GI Blood Loss  Result Date: 02/01/2020 CLINICAL DATA:  GI bleed. History of colonoscopy yesterday with 8 polyps removed. EXAM: NUCLEAR MEDICINE GASTROINTESTINAL BLEEDING SCAN TECHNIQUE: Sequential abdominal images were obtained following intravenous administration of Tc-71m labeled red blood cells. RADIOPHARMACEUTICALS:   26.5 mCi Tc-93m pertechnetate in-vitro labeled red cells. COMPARISON:  CT scan 01/31/2020 FINDINGS: At 10 minutes there is a focus of activity noted in the right abdomen in the region of the hepatic flexure. This shows progressive increased intensity. Activity then moves across the transverse colon to the splenic flexure and down into the descending colon. Findings consistent with a bleeding source in the right colon near the hepatic flexure. IMPRESSION: Findings consistent with a bleeding source in the right colon near the hepatic flexure. Electronically Signed   By: Marijo Sanes M.D.   On: 02/01/2020 20:57   CT ABDOMEN PELVIS W CONTRAST  Result Date: 01/31/2020 CLINICAL DATA:  Abdominal distension and rectal bleeding after colonoscopy today EXAM: CT ABDOMEN AND PELVIS WITH CONTRAST TECHNIQUE: Multidetector CT imaging of the abdomen and pelvis was performed using the standard protocol following bolus administration of intravenous contrast. CONTRAST:  172mL OMNIPAQUE IOHEXOL 300 MG/ML  SOLN COMPARISON:  01/31/2020 FINDINGS: Lower chest: No acute pleural or parenchymal lung disease. Hepatobiliary: No focal liver abnormality is seen. No gallstones, gallbladder wall thickening, or biliary dilatation. Pancreas: Unremarkable. No pancreatic ductal dilatation or surrounding inflammatory changes. Spleen: Normal in size without focal abnormality. Adrenals/Urinary Tract: Adrenal glands are unremarkable. Kidneys are normal, without renal calculi, focal lesion, or hydronephrosis. Bladder is unremarkable. Stomach/Bowel: No bowel obstruction or ileus. Normal appendix right lower quadrant. No bowel wall thickening or inflammatory change. Vascular/Lymphatic: Aortic atherosclerosis. No enlarged abdominal or pelvic lymph nodes. Reproductive: Prostate is unremarkable. Other: No free fluid or free intra-abdominal gas. No abdominal wall hernia. Musculoskeletal: No acute or destructive bony lesion. Reconstructed images demonstrate  no additional findings. IMPRESSION: 1. No evidence of complication after colonoscopy. No bowel perforation. 2. No acute intra-abdominal or intrapelvic process. 3.  Aortic Atherosclerosis (ICD10-I70.0). Electronically Signed   By: Randa Ngo M.D.   On: 01/31/2020 19:26   IR Angiogram Visceral Selective  Result Date: 02/02/2020 INDICATION: Acute lower GI bleeding, positive nuclear medicine blood scan and positive abdominal pelvis CTA demonstrating cecal bleeding at a resected polyps site where there is an endo clip. EXAM: Ultrasound guidance for vascular access Celiac and SMA catheterizations and angiograms Three additional peripheral right ileocolic and right colic micro catheterization and angiograms Successful micro coil embolization of 2 peripheral branches of the right colic vasculature (to the cecal angio dysplasia distribution) MEDICATIONS: 1% lidocaine local. The antibiotic was administered within 1 hour of the procedure ANESTHESIA/SEDATION: Moderate (conscious) sedation was employed during this procedure. A total of Versed 1.5 mg and Fentanyl 575 mcg was administered intravenously. Moderate Sedation Time: 60 minutes. The patient's level of consciousness and vital signs were monitored continuously by radiology nursing throughout the procedure under my direct supervision. CONTRAST:  85 cc  omni 300 FLUOROSCOPY TIME:  Fluoroscopy Time: 11 minutes 54 seconds (1,835 mGy). COMPLICATIONS: None immediate. PROCEDURE: Informed consent was obtained from the patient following explanation of the procedure, risks, benefits and alternatives. The patient understands, agrees and consents for the procedure. All questions were addressed. A time out was performed prior to the initiation of the procedure. Maximal barrier sterile technique utilized including caps, mask, sterile gowns, sterile gloves, large sterile drape, hand hygiene, and Betadine prep. Under sterile conditions and local anesthesia, ultrasound micropuncture  access performed of the right common femoral artery. Images obtained for documentation of the patent right common femoral artery. Five French sheath inserted a Bentson guidewire. C2 catheter utilized to initially select the celiac origin. Celiac angiogram performed. Celiac: The celiac origin is widely patent including its branches. Specifically the splenic, left gastric, hepatic and gastroduodenal vasculature all patent. No active bleeding in the celiac territory. Catheter was retracted and utilized to select the SMA. Several SMA angiograms were performed. Initial SMA: Atherosclerotic changes noted but the SMA is patent. The jejunal and colic branches are patent throughout the central mesentery. Limited assessment of the peripheral right lower quadrant colic branches. For better visualization, a Renegade STC catheter and a single angle GT glide wire were utilized to advance the access peripherally initially into the far distal aspect of the distal SMA. Peripheral distal SMA angiogram performed. Peripheral SMA: Peripheral SMA branches are patent supplying small bowel in the right lower quadrant. Normal vascularity to the small bowel in this region. No evidence of active contrast extravasation or bleeding. Microcatheter was retracted and utilized to select the ileal colic common trunk. Additional peripheral angiography report. Ileocolic common trunk: Right ileocolic trunk branches are patent and supply the cecal area. 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 Renegade STC catheter was advanced over a double angled glidewire more peripherally into a right colic branch. Peripheral right colic angiogram performed. 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  Peripheral right colic branch micro coil embolization. 2 mm interlock coils ranging in length from 4-6 cm were successfully deployed in the peripheral right colic branch. Following embolization, there is less visualization of the hypervascularity in the cecal area with preservation of the marginal branch arterial supply to the colon. Microcatheter was retracted into the main right colic branch. Repeat angiogram again confirms additional branches supplying the tortuous hypervascular cecal area with the early draining vein remaining. 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 Microcatheter was advanced into a inferior right colic peripheral branch directed toward the endo clip site. Second peripheral right colic branch angiogram performed. Second inferior peripheral right colic branch: This right colic branch also demonstrates arterial supply to the tortuous hypervascular area of the cecum with an early draining vein again compatible with angiodysplasia. Second inferior peripheral right colic branch micro coil embolization: Additional 2 mm x 6 cm micro coil was deployed within this second inferior right colic branch. Microcatheter again was retracted more proximal into the main right colic branch. Repeat right colic angiogram performed. Right colic angiogram: Following embolization of the 2 peripheral right colic branches, there is less arterial supply to the hypervascular cecal abnormality with the early draining vein less apparent. There is preserved marginal branch arterial supply to the cecum. At this point, the access was removed. Hemostasis obtained at the right  common femoral artery access site with the ExoSeal device. No immediate complication. Patient tolerated the procedure well. 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 Following embolization there is less  arterial vascular supply to the cecal angio dysplasia with preserved marginal branch supply to the colon. Electronically Signed   By: Jerilynn Mages.  Shick M.D.   On: 02/02/2020 13:35   IR Angiogram Visceral Selective  Result Date: 02/02/2020 INDICATION: Acute lower GI bleeding, positive nuclear medicine blood scan and positive abdominal pelvis CTA demonstrating cecal bleeding at a resected polyps site where there is an endo clip. EXAM: Ultrasound guidance for vascular access Celiac and SMA catheterizations and angiograms Three additional peripheral right ileocolic and right colic micro catheterization and angiograms Successful micro coil embolization of 2 peripheral branches of the right colic vasculature (to the cecal angio dysplasia distribution) MEDICATIONS: 1% lidocaine local. The antibiotic was administered within 1 hour of the procedure ANESTHESIA/SEDATION: Moderate (conscious) sedation was employed during this procedure. A total of Versed 1.5 mg and Fentanyl 575 mcg was administered intravenously. Moderate Sedation Time: 60 minutes. The patient's level of consciousness and vital signs were monitored continuously by radiology nursing throughout the procedure under my direct supervision. CONTRAST:  85 cc omni 300 FLUOROSCOPY TIME:  Fluoroscopy Time: 11 minutes 54 seconds (1,835 mGy). COMPLICATIONS: None immediate. PROCEDURE: Informed consent was obtained from the patient following explanation of the procedure, risks, benefits and alternatives. The patient understands, agrees and consents for the procedure. All questions were addressed. A time out was performed prior to the initiation of the procedure. Maximal barrier sterile technique utilized including caps, mask, sterile gowns, sterile gloves, large sterile drape, hand hygiene, and Betadine prep. Under sterile conditions and local anesthesia, ultrasound micropuncture access performed of the right common femoral artery. Images obtained for documentation of the patent  right common femoral artery. Five French sheath inserted a Bentson guidewire. C2 catheter utilized to initially select the celiac origin. Celiac angiogram performed. Celiac: The celiac origin is widely patent including its branches. Specifically the splenic, left gastric, hepatic and gastroduodenal vasculature all patent. No active bleeding in the celiac territory. Catheter was retracted and utilized to select the SMA. Several SMA angiograms were performed. Initial SMA: Atherosclerotic changes noted but the SMA is patent. The jejunal and colic branches are patent throughout the central mesentery. Limited assessment of the peripheral right lower quadrant colic branches. For better visualization, a Renegade STC catheter and a single angle GT glide wire were utilized to advance the access peripherally initially into the far distal aspect of the distal SMA. Peripheral distal SMA angiogram performed. Peripheral SMA: Peripheral SMA branches are patent supplying small bowel in the right lower quadrant. Normal vascularity to the small bowel in this region. No evidence of active contrast extravasation or bleeding. Microcatheter was retracted and utilized to select the ileal colic common trunk. Additional peripheral angiography report. Ileocolic common trunk: Right ileocolic trunk branches are patent and supply the cecal area. 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 Renegade STC catheter was advanced over a double angled glidewire more peripherally into a right colic branch. Peripheral right colic angiogram performed. 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 Peripheral right colic branch micro coil embolization. 2  mm interlock coils ranging in length from 4-6 cm  were successfully deployed in the peripheral right colic branch. Following embolization, there is less visualization of the hypervascularity in the cecal area with preservation of the marginal branch arterial supply to the colon. Microcatheter was retracted into the main right colic branch. Repeat angiogram again confirms additional branches supplying the tortuous hypervascular cecal area with the early draining vein remaining. 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 Microcatheter was advanced into a inferior right colic peripheral branch directed toward the endo clip site. Second peripheral right colic branch angiogram performed. Second inferior peripheral right colic branch: This right colic branch also demonstrates arterial supply to the tortuous hypervascular area of the cecum with an early draining vein again compatible with angiodysplasia. Second inferior peripheral right colic branch micro coil embolization: Additional 2 mm x 6 cm micro coil was deployed within this second inferior right colic branch. Microcatheter again was retracted more proximal into the main right colic branch. Repeat right colic angiogram performed. Right colic angiogram: Following embolization of the 2 peripheral right colic branches, there is less arterial supply to the hypervascular cecal abnormality with the early draining vein less apparent. There is preserved marginal branch arterial supply to the cecum. At this point, the access was removed. Hemostasis obtained at the right common femoral artery access site with the ExoSeal device. No immediate complication. Patient tolerated the procedure well. 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 Following embolization there is less arterial vascular supply to the cecal angio dysplasia with preserved marginal branch supply to the colon.  Electronically Signed   By: Jerilynn Mages.  Shick M.D.   On: 02/02/2020 13:35   IR Angiogram Selective Each Additional Vessel  Result Date: 02/02/2020 INDICATION: Acute lower GI bleeding, positive nuclear medicine blood scan and positive abdominal pelvis CTA demonstrating cecal bleeding at a resected polyps site where there is an endo clip. EXAM: Ultrasound guidance for vascular access Celiac and SMA catheterizations and angiograms Three additional peripheral right ileocolic and right colic micro catheterization and angiograms Successful micro coil embolization of 2 peripheral branches of the right colic vasculature (to the cecal angio dysplasia distribution) MEDICATIONS: 1% lidocaine local. The antibiotic was administered within 1 hour of the procedure ANESTHESIA/SEDATION: Moderate (conscious) sedation was employed during this procedure. A total of Versed 1.5 mg and Fentanyl 575 mcg was administered intravenously. Moderate Sedation Time: 60 minutes. The patient's level of consciousness and vital signs were monitored continuously by radiology nursing throughout the procedure under my direct supervision. CONTRAST:  85 cc omni 300 FLUOROSCOPY TIME:  Fluoroscopy Time: 11 minutes 54 seconds (1,835 mGy). COMPLICATIONS: None immediate. PROCEDURE: Informed consent was obtained from the patient following explanation of the procedure, risks, benefits and alternatives. The patient understands, agrees and consents for the procedure. All questions were addressed. A time out was performed prior to the initiation of the procedure. Maximal barrier sterile technique utilized including caps, mask, sterile gowns, sterile gloves, large sterile drape, hand hygiene, and Betadine prep. Under sterile conditions and local anesthesia, ultrasound micropuncture access performed of the right common femoral artery. Images obtained for documentation of the patent right common femoral artery. Five French sheath inserted a Bentson guidewire. C2 catheter  utilized to initially select the celiac origin. Celiac angiogram performed. Celiac: The celiac origin is widely patent including its branches. Specifically the splenic, left gastric, hepatic and gastroduodenal vasculature all patent. No active  bleeding in the celiac territory. Catheter was retracted and utilized to select the SMA. Several SMA angiograms were performed. Initial SMA: Atherosclerotic changes noted but the SMA is patent. The jejunal and colic branches are patent throughout the central mesentery. Limited assessment of the peripheral right lower quadrant colic branches. For better visualization, a Renegade STC catheter and a single angle GT glide wire were utilized to advance the access peripherally initially into the far distal aspect of the distal SMA. Peripheral distal SMA angiogram performed. Peripheral SMA: Peripheral SMA branches are patent supplying small bowel in the right lower quadrant. Normal vascularity to the small bowel in this region. No evidence of active contrast extravasation or bleeding. Microcatheter was retracted and utilized to select the ileal colic common trunk. Additional peripheral angiography report. Ileocolic common trunk: Right ileocolic trunk branches are patent and supply the cecal area. 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 Renegade STC catheter was advanced over a double angled glidewire more peripherally into a right colic branch. Peripheral right colic angiogram performed. 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 Peripheral right colic branch micro coil embolization. 2 mm interlock coils ranging in length from 4-6 cm were successfully deployed in the peripheral right colic branch. Following embolization,  there is less visualization of the hypervascularity in the cecal area with preservation of the marginal branch arterial supply to the colon. Microcatheter was retracted into the main right colic branch. Repeat angiogram again confirms additional branches supplying the tortuous hypervascular cecal area with the early draining vein remaining. 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 Microcatheter was advanced into a inferior right colic peripheral branch directed toward the endo clip site. Second peripheral right colic branch angiogram performed. Second inferior peripheral right colic branch: This right colic branch also demonstrates arterial supply to the tortuous hypervascular area of the cecum with an early draining vein again compatible with angiodysplasia. Second inferior peripheral right colic branch micro coil embolization: Additional 2 mm x 6 cm micro coil was deployed within this second inferior right colic branch. Microcatheter again was retracted more proximal into the main right colic branch. Repeat right colic angiogram performed. Right colic angiogram: Following embolization of the 2 peripheral right colic branches, there is less arterial supply to the hypervascular cecal abnormality with the early draining vein less apparent. There is preserved marginal branch arterial supply to the cecum. At this point, the access was removed. Hemostasis obtained at the right common femoral artery access site with the ExoSeal device. No immediate complication. Patient tolerated the procedure well. 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 Following embolization there is less arterial vascular supply to the cecal angio dysplasia with preserved marginal branch supply to the colon. Electronically Signed   By: Jerilynn Mages.  Shick M.D.   On: 02/02/2020 13:35   IR Angiogram Selective  Each Additional Vessel  Result Date: 02/02/2020 INDICATION: Acute lower GI bleeding, positive nuclear medicine blood scan and positive abdominal pelvis CTA demonstrating cecal bleeding at a resected polyps site where there is an endo clip. EXAM: Ultrasound guidance for vascular access Celiac and SMA catheterizations and angiograms Three additional peripheral right ileocolic and right colic micro catheterization and angiograms Successful micro coil embolization of 2  peripheral branches of the right colic vasculature (to the cecal angio dysplasia distribution) MEDICATIONS: 1% lidocaine local. The antibiotic was administered within 1 hour of the procedure ANESTHESIA/SEDATION: Moderate (conscious) sedation was employed during this procedure. A total of Versed 1.5 mg and Fentanyl 575 mcg was administered intravenously. Moderate Sedation Time: 60 minutes. The patient's level of consciousness and vital signs were monitored continuously by radiology nursing throughout the procedure under my direct supervision. CONTRAST:  85 cc omni 300 FLUOROSCOPY TIME:  Fluoroscopy Time: 11 minutes 54 seconds (1,835 mGy). COMPLICATIONS: None immediate. PROCEDURE: Informed consent was obtained from the patient following explanation of the procedure, risks, benefits and alternatives. The patient understands, agrees and consents for the procedure. All questions were addressed. A time out was performed prior to the initiation of the procedure. Maximal barrier sterile technique utilized including caps, mask, sterile gowns, sterile gloves, large sterile drape, hand hygiene, and Betadine prep. Under sterile conditions and local anesthesia, ultrasound micropuncture access performed of the right common femoral artery. Images obtained for documentation of the patent right common femoral artery. Five French sheath inserted a Bentson guidewire. C2 catheter utilized to initially select the celiac origin. Celiac angiogram performed. Celiac: The celiac  origin is widely patent including its branches. Specifically the splenic, left gastric, hepatic and gastroduodenal vasculature all patent. No active bleeding in the celiac territory. Catheter was retracted and utilized to select the SMA. Several SMA angiograms were performed. Initial SMA: Atherosclerotic changes noted but the SMA is patent. The jejunal and colic branches are patent throughout the central mesentery. Limited assessment of the peripheral right lower quadrant colic branches. For better visualization, a Renegade STC catheter and a single angle GT glide wire were utilized to advance the access peripherally initially into the far distal aspect of the distal SMA. Peripheral distal SMA angiogram performed. Peripheral SMA: Peripheral SMA branches are patent supplying small bowel in the right lower quadrant. Normal vascularity to the small bowel in this region. No evidence of active contrast extravasation or bleeding. Microcatheter was retracted and utilized to select the ileal colic common trunk. Additional peripheral angiography report. Ileocolic common trunk: Right ileocolic trunk branches are patent and supply the cecal area. 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 Renegade STC catheter was advanced over a double angled glidewire more peripherally into a right colic branch. Peripheral right colic angiogram performed. 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 Peripheral right colic branch micro coil embolization. 2 mm interlock coils ranging in length from 4-6 cm were successfully deployed in the peripheral right colic branch. Following embolization, there is less visualization of the hypervascularity in the cecal area with preservation of the  marginal branch arterial supply to the colon. Microcatheter was retracted into the main right colic branch. Repeat angiogram again confirms additional branches supplying the tortuous hypervascular cecal area with the early draining vein remaining. 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 Microcatheter was advanced into a inferior right colic peripheral branch directed toward the endo clip site. Second peripheral right colic branch angiogram performed. Second inferior peripheral right colic branch: This right colic branch also demonstrates arterial supply to the tortuous hypervascular area of the cecum with an early draining vein again compatible with angiodysplasia. Second inferior peripheral right colic branch micro coil embolization: Additional 2 mm x 6 cm  micro coil was deployed within this second inferior right colic branch. Microcatheter again was retracted more proximal into the main right colic branch. Repeat right colic angiogram performed. Right colic angiogram: Following embolization of the 2 peripheral right colic branches, there is less arterial supply to the hypervascular cecal abnormality with the early draining vein less apparent. There is preserved marginal branch arterial supply to the cecum. At this point, the access was removed. Hemostasis obtained at the right common femoral artery access site with the ExoSeal device. No immediate complication. Patient tolerated the procedure well. 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 Following embolization there is less arterial vascular supply to the cecal angio dysplasia with preserved marginal branch supply to the colon. Electronically Signed   By: Jerilynn Mages.  Shick M.D.   On: 02/02/2020 13:35   IR Angiogram Selective Each Additional Vessel  Result Date: 02/02/2020 INDICATION: Acute lower GI bleeding,  positive nuclear medicine blood scan and positive abdominal pelvis CTA demonstrating cecal bleeding at a resected polyps site where there is an endo clip. EXAM: Ultrasound guidance for vascular access Celiac and SMA catheterizations and angiograms Three additional peripheral right ileocolic and right colic micro catheterization and angiograms Successful micro coil embolization of 2 peripheral branches of the right colic vasculature (to the cecal angio dysplasia distribution) MEDICATIONS: 1% lidocaine local. The antibiotic was administered within 1 hour of the procedure ANESTHESIA/SEDATION: Moderate (conscious) sedation was employed during this procedure. A total of Versed 1.5 mg and Fentanyl 575 mcg was administered intravenously. Moderate Sedation Time: 60 minutes. The patient's level of consciousness and vital signs were monitored continuously by radiology nursing throughout the procedure under my direct supervision. CONTRAST:  85 cc omni 300 FLUOROSCOPY TIME:  Fluoroscopy Time: 11 minutes 54 seconds (1,835 mGy). COMPLICATIONS: None immediate. PROCEDURE: Informed consent was obtained from the patient following explanation of the procedure, risks, benefits and alternatives. The patient understands, agrees and consents for the procedure. All questions were addressed. A time out was performed prior to the initiation of the procedure. Maximal barrier sterile technique utilized including caps, mask, sterile gowns, sterile gloves, large sterile drape, hand hygiene, and Betadine prep. Under sterile conditions and local anesthesia, ultrasound micropuncture access performed of the right common femoral artery. Images obtained for documentation of the patent right common femoral artery. Five French sheath inserted a Bentson guidewire. C2 catheter utilized to initially select the celiac origin. Celiac angiogram performed. Celiac: The celiac origin is widely patent including its branches. Specifically the splenic, left  gastric, hepatic and gastroduodenal vasculature all patent. No active bleeding in the celiac territory. Catheter was retracted and utilized to select the SMA. Several SMA angiograms were performed. Initial SMA: Atherosclerotic changes noted but the SMA is patent. The jejunal and colic branches are patent throughout the central mesentery. Limited assessment of the peripheral right lower quadrant colic branches. For better visualization, a Renegade STC catheter and a single angle GT glide wire were utilized to advance the access peripherally initially into the far distal aspect of the distal SMA. Peripheral distal SMA angiogram performed. Peripheral SMA: Peripheral SMA branches are patent supplying small bowel in the right lower quadrant. Normal vascularity to the small bowel in this region. No evidence of active contrast extravasation or bleeding. Microcatheter was retracted and utilized to select the ileal colic common trunk. Additional peripheral angiography report. Ileocolic common trunk: Right ileocolic trunk branches are patent and supply the  cecal area. 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 Renegade STC catheter was advanced over a double angled glidewire more peripherally into a right colic branch. Peripheral right colic angiogram performed. 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 Peripheral right colic branch micro coil embolization. 2 mm interlock coils ranging in length from 4-6 cm were successfully deployed in the peripheral right colic branch. Following embolization, there is less visualization of the hypervascularity in the cecal area with preservation of the marginal branch arterial supply to the colon. Microcatheter was retracted into the  main right colic branch. Repeat angiogram again confirms additional branches supplying the tortuous hypervascular cecal area with the early draining vein remaining. 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 Microcatheter was advanced into a inferior right colic peripheral branch directed toward the endo clip site. Second peripheral right colic branch angiogram performed. Second inferior peripheral right colic branch: This right colic branch also demonstrates arterial supply to the tortuous hypervascular area of the cecum with an early draining vein again compatible with angiodysplasia. Second inferior peripheral right colic branch micro coil embolization: Additional 2 mm x 6 cm micro coil was deployed within this second inferior right colic branch. Microcatheter again was retracted more proximal into the main right colic branch. Repeat right colic angiogram performed. Right colic angiogram: Following embolization of the 2 peripheral right colic branches, there is less arterial supply to the hypervascular cecal abnormality with the early draining vein less apparent. There is preserved marginal branch arterial supply to the cecum. At this point, the access was removed. Hemostasis obtained at the right common femoral artery access site with the ExoSeal device. No immediate complication. Patient tolerated the procedure well. 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 Following embolization there is less arterial vascular supply to the cecal angio dysplasia with preserved marginal branch supply to the colon. Electronically Signed   By: Jerilynn Mages.  Shick M.D.   On: 02/02/2020 13:35   IR US Guide Vasc Access Right  Result Date: 02/02/2020 INDICATION: Acute lower GI bleeding, positive nuclear medicine blood scan and positive abdominal pelvis CTA demonstrating cecal bleeding at a  resected polyps site where there is an endo clip. EXAM: Ultrasound guidance for vascular access Celiac and SMA catheterizations and angiograms Three additional peripheral right ileocolic and right colic micro catheterization and angiograms Successful micro coil embolization of 2 peripheral branches of the right colic vasculature (to the cecal angio dysplasia distribution) MEDICATIONS: 1% lidocaine local. The antibiotic was administered within 1 hour of the procedure ANESTHESIA/SEDATION: Moderate (conscious) sedation was employed during this procedure. A total of Versed 1.5 mg and Fentanyl 575 mcg was administered intravenously. Moderate Sedation Time: 60 minutes. The patient's level of consciousness and vital signs were monitored continuously by radiology nursing throughout the procedure under my direct supervision. CONTRAST:  85 cc omni 300 FLUOROSCOPY TIME:  Fluoroscopy Time: 11 minutes 54 seconds (1,835 mGy). COMPLICATIONS: None immediate. PROCEDURE: Informed consent was obtained from the patient following explanation of the procedure, risks, benefits and alternatives. The patient understands, agrees and consents for the procedure. All questions were addressed. A time out was performed prior to the initiation of the procedure. Maximal barrier sterile technique utilized including caps, mask, sterile  gowns, sterile gloves, large sterile drape, hand hygiene, and Betadine prep. Under sterile conditions and local anesthesia, ultrasound micropuncture access performed of the right common femoral artery. Images obtained for documentation of the patent right common femoral artery. Five French sheath inserted a Bentson guidewire. C2 catheter utilized to initially select the celiac origin. Celiac angiogram performed. Celiac: The celiac origin is widely patent including its branches. Specifically the splenic, left gastric, hepatic and gastroduodenal vasculature all patent. No active bleeding in the celiac territory. Catheter  was retracted and utilized to select the SMA. Several SMA angiograms were performed. Initial SMA: Atherosclerotic changes noted but the SMA is patent. The jejunal and colic branches are patent throughout the central mesentery. Limited assessment of the peripheral right lower quadrant colic branches. For better visualization, a Renegade STC catheter and a single angle GT glide wire were utilized to advance the access peripherally initially into the far distal aspect of the distal SMA. Peripheral distal SMA angiogram performed. Peripheral SMA: Peripheral SMA branches are patent supplying small bowel in the right lower quadrant. Normal vascularity to the small bowel in this region. No evidence of active contrast extravasation or bleeding. Microcatheter was retracted and utilized to select the ileal colic common trunk. Additional peripheral angiography report. Ileocolic common trunk: Right ileocolic trunk branches are patent and supply the cecal area. 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 Renegade STC catheter was advanced over a double angled glidewire more peripherally into a right colic branch. Peripheral right colic angiogram performed. 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 Peripheral right colic branch micro coil embolization. 2 mm interlock coils ranging in length from 4-6 cm were successfully deployed in the peripheral right colic branch. Following embolization, there is less visualization of the hypervascularity in the cecal area with preservation of the marginal branch arterial supply to the colon. Microcatheter was retracted into the main right colic branch. Repeat angiogram again confirms additional branches supplying the tortuous  hypervascular cecal area with the early draining vein remaining. 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 Microcatheter was advanced into a inferior right colic peripheral branch directed toward the endo clip site. Second peripheral right colic branch angiogram performed. Second inferior peripheral right colic branch: This right colic branch also demonstrates arterial supply to the tortuous hypervascular area of the cecum with an early draining vein again compatible with angiodysplasia. Second inferior peripheral right colic branch micro coil embolization: Additional 2 mm x 6 cm micro coil was deployed within this second inferior right colic branch. Microcatheter again was retracted more proximal into the main right colic branch. Repeat right colic angiogram performed. Right colic angiogram: Following embolization of the 2 peripheral right colic branches, there is less arterial supply to the hypervascular cecal abnormality with the early draining vein less apparent. There is preserved marginal branch arterial supply to the cecum. At this point, the access was removed. Hemostasis obtained at the right common femoral artery access site with the ExoSeal device. No immediate complication. Patient tolerated the procedure well. 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 Following embolization there is less arterial vascular supply to the cecal angio dysplasia with preserved marginal branch supply to the colon. Electronically Signed   By:  M.  Shick M.D.   On: 02/02/2020 13:35   DG Chest Port 1 View  Result Date: 01/31/2020 CLINICAL DATA:  Abdominal pain and rectal bleeding after colonoscopy EXAM: PORTABLE CHEST 1 VIEW COMPARISON:  03/16/2016 FINDINGS: 2 frontal views of the chest demonstrate an unremarkable cardiac silhouette. No airspace disease, effusion, or  pneumothorax. No evidence of free gas in the greater peritoneal sac. IMPRESSION: 1. No acute intrathoracic process. Electronically Signed   By: Randa Ngo M.D.   On: 01/31/2020 19:43   DG Abd Portable 1 View  Result Date: 01/31/2020 CLINICAL DATA:  Abdominal pain and rectal bleeding status post colonoscopy. EXAM: PORTABLE ABDOMEN - 1 VIEW COMPARISON:  None. FINDINGS: The bowel gas pattern is normal. There is no evidence of free air. No radio-opaque calculi or other significant radiographic abnormality are seen. IMPRESSION: Negative. Electronically Signed   By: Virgina Norfolk M.D.   On: 01/31/2020 18:48   IR EMBO ART  VEN HEMORR LYMPH EXTRAV  INC GUIDE ROADMAPPING  Result Date: 02/02/2020 INDICATION: Acute lower GI bleeding, positive nuclear medicine blood scan and positive abdominal pelvis CTA demonstrating cecal bleeding at a resected polyps site where there is an endo clip. EXAM: Ultrasound guidance for vascular access Celiac and SMA catheterizations and angiograms Three additional peripheral right ileocolic and right colic micro catheterization and angiograms Successful micro coil embolization of 2 peripheral branches of the right colic vasculature (to the cecal angio dysplasia distribution) MEDICATIONS: 1% lidocaine local. The antibiotic was administered within 1 hour of the procedure ANESTHESIA/SEDATION: Moderate (conscious) sedation was employed during this procedure. A total of Versed 1.5 mg and Fentanyl 575 mcg was administered intravenously. Moderate Sedation Time: 60 minutes. The patient's level of consciousness and vital signs were monitored continuously by radiology nursing throughout the procedure under my direct supervision. CONTRAST:  85 cc omni 300 FLUOROSCOPY TIME:  Fluoroscopy Time: 11 minutes 54 seconds (1,835 mGy). COMPLICATIONS: None immediate. PROCEDURE: Informed consent was obtained from the patient following explanation of the procedure, risks, benefits and alternatives. The  patient understands, agrees and consents for the procedure. All questions were addressed. A time out was performed prior to the initiation of the procedure. Maximal barrier sterile technique utilized including caps, mask, sterile gowns, sterile gloves, large sterile drape, hand hygiene, and Betadine prep. Under sterile conditions and local anesthesia, ultrasound micropuncture access performed of the right common femoral artery. Images obtained for documentation of the patent right common femoral artery. Five French sheath inserted a Bentson guidewire. C2 catheter utilized to initially select the celiac origin. Celiac angiogram performed. Celiac: The celiac origin is widely patent including its branches. Specifically the splenic, left gastric, hepatic and gastroduodenal vasculature all patent. No active bleeding in the celiac territory. Catheter was retracted and utilized to select the SMA. Several SMA angiograms were performed. Initial SMA: Atherosclerotic changes noted but the SMA is patent. The jejunal and colic branches are patent throughout the central mesentery. Limited assessment of the peripheral right lower quadrant colic branches. For better visualization, a Renegade STC catheter and a single angle GT glide wire were utilized to advance the access peripherally initially into the far distal aspect of the distal SMA. Peripheral distal SMA angiogram performed. Peripheral SMA: Peripheral SMA branches are patent supplying small bowel in the right lower quadrant. Normal vascularity to the small bowel in this region. No evidence of active contrast extravasation or bleeding. Microcatheter was retracted and utilized to select the ileal colic common trunk. Additional peripheral angiography report. Ileocolic common trunk: Right  ileocolic trunk branches are patent and supply the cecal area. In this region there is increased vascularity and tortuosity along the cecum with an early draining vein compatible with angio  dysplasia angiographically. Renegade STC catheter was advanced over a double angled glidewire more peripherally into a right colic branch. Peripheral right colic angiogram performed. 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 Peripheral right colic branch micro coil embolization. 2 mm interlock coils ranging in length from 4-6 cm were successfully deployed in the peripheral right colic branch. Following embolization, there is less visualization of the hypervascularity in the cecal area with preservation of the marginal branch arterial supply to the colon. Microcatheter was retracted into the main right colic branch. Repeat angiogram again confirms additional branches supplying the tortuous hypervascular cecal area with the early draining vein remaining. 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 Microcatheter was advanced into a inferior right colic peripheral branch directed toward the endo clip site. Second peripheral right colic branch angiogram performed. Second inferior peripheral right colic branch: This right colic branch also demonstrates arterial supply to the tortuous hypervascular area of the cecum with an early draining vein again compatible with angiodysplasia. Second inferior peripheral right colic branch micro coil embolization: Additional 2 mm x 6 cm micro coil was deployed within this second inferior right colic branch. Microcatheter again was retracted more proximal into the main right colic branch. Repeat right colic angiogram performed. Right colic angiogram: Following embolization of the 2 peripheral right colic branches, there is less arterial supply to the hypervascular cecal abnormality with the early draining vein less apparent. There is preserved marginal branch arterial supply to the cecum. At this point, the access was removed.  Hemostasis obtained at the right common femoral artery access site with the ExoSeal device. No immediate complication. Patient tolerated the procedure well. 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 Following embolization there is less arterial vascular supply to the cecal angio dysplasia with preserved marginal branch supply to the colon. Electronically Signed   By: Jerilynn Mages.  Shick M.D.   On: 02/02/2020 13:35   CT Angio Abd/Pel w/ and/or w/o  Result Date: 02/01/2020 CLINICAL DATA:  GI bleed. Colonoscopy yesterday with a polyps removed. EXAM: CTA ABDOMEN AND PELVIS WITHOUT AND WITH CONTRAST TECHNIQUE: Multidetector CT imaging of the abdomen and pelvis was performed using the standard protocol during bolus administration of intravenous contrast. Multiplanar reconstructed images and MIPs were obtained and reviewed to evaluate the vascular anatomy. CONTRAST:  132mL OMNIPAQUE IOHEXOL 350 MG/ML SOLN COMPARISON:  GI bleeding scan earlier today with active bleeding at the hepatic flexure. Abdominal CT yesterday. FINDINGS: VASCULAR Aorta: Moderate atherosclerosis. Calcified and noncalcified plaque with irregular noncalcified plaque anteriorly. 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 No dissection or vasculitis. Celiac: Patent without evidence of aneurysm, dissection, vasculitis or significant stenosis. SMA: Patent without evidence of aneurysm, dissection, vasculitis or significant stenosis. Renals: 2 right and single left renal arteries. Plaque at the origin of all renal arteries without significant stenosis. No acute findings or FMD. IMA: Patent. Inflow: Moderately calcified and tortuous. No stenosis, dissection or acute findings. Proximal Outflow: Moderately calcified. 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 Mild to moderate  stenosis involving the proximal right common femoral artery. No acute findings.  Veins: Venous phase imaging demonstrates patent portal vein. No evidence of mesenteric venous thrombus. No acute venous abnormality. Circumaortic left renal vein. Review of the MIP images confirms the above findings. NON-VASCULAR Lower chest: No acute findings. No pleural fluid or consolidation. Thin walled cysts in the lower lobes. Heart is normal in size. Coronary artery calcifications. Hepatobiliary: No focal liver abnormality is seen. Minimal layering stones or sludge in the gallbladder without pericholecystic inflammation. Pancreas: No ductal dilatation or inflammation. Spleen: Normal in size without focal abnormality. Adrenals/Urinary Tract: Normal adrenal glands. No hydronephrosis or perinephric edema. Homogeneous renal enhancement. Small low-density lesions in both kidneys are too small to characterize. Urinary bladder is physiologically distended without wall thickening. Stomach/Bowel: There is active extravasation of contrast into the GI track in the cecum adjacent to a surgical clip. This is seen on arterial axial images series 6, images 79 and 80, and persist on venous phase, series 8, images 48 and 49. Liquid contents in the ascending and hepatic flexure of the colon. There are scattered ascending colonic diverticula without diverticulitis. Fluid in the transverse and descending colon. Mild distal colonic diverticulosis without diverticulitis. Unremarkable stomach and small bowel. Normal appendix. Lymphatic: No abdominopelvic adenopathy. Reproductive: Prostate is unremarkable. Other: No ascites or free air. There is fat in both inguinal canals. Musculoskeletal: There are no acute or suspicious osseous abnormalities. IMPRESSION: 1. Positive for active extravasation of contrast into the GI track in the cecum adjacent to a surgical clip consistent with acute GI bleed. This correlates to the site of active bleeding on GI  bleeding scan earlier today, the cecum is slightly high-riding in the mid abdomen. 2. Aortic atherosclerosis. 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 Recommend follow-up every 3 years. This recommendation follows ACR consensus guidelines: White Paper of the ACR Incidental Findings Committee II on Vascular Findings. J Am Coll Radiol 2013; 10:789-794. 3. Colonic diverticulosis without diverticulitis. 4. Minimal layering stones or sludge in the gallbladder without gallbladder inflammation. 5. 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 Aortic Atherosclerosis (ICD10-I70.0). These results will be called to the ordering clinician or representative by the Radiologist Assistant, and communication documented in the PACS or [employer]. Electronically Signed   By: Keith Rake M.D.   On: 02/01/2020 22:38    Microbiology: Recent Results (from the past 240 hour(s))  SARS Coronavirus 2 by RT PCR (hospital order, performed in [facility] hospital lab) Nasopharyngeal Nasopharyngeal Swab     Status: None   Collection Time: 01/31/20  6:34 PM   Specimen: Nasopharyngeal Swab  Result Value Ref Range Status   SARS Coronavirus 2 NEGATIVE NEGATIVE Final    Comment: (NOTE) SARS-CoV-2 target nucleic acids are NOT DETECTED.  The SARS-CoV-2 RNA is generally detectable in upper and lower respiratory specimens during the acute phase of infection. The lowest concentration of SARS-CoV-2 viral copies this assay can detect is 250 copies / mL. A negative result does not preclude SARS-CoV-2 infection and should not be used as the sole basis for treatment or other patient management decisions.  A negative result may occur with improper specimen collection / handling, submission of specimen other than nasopharyngeal swab, presence of viral mutation(s) within the areas targeted by this assay, and inadequate number of viral copies (<250 copies / mL). A negative  result must be combined with clinical observations, patient history, and epidemiological information.  Fact Sheet for Patients:   StrictlyIdeas.no  Fact Sheet for Healthcare Providers: BankingDealers.co.za  This  test is not yet approved or  cleared by the [place] and has been authorized for detection and/or diagnosis of SARS-CoV-2 by FDA under an Emergency Use Authorization (EUA).  This EUA will remain in effect (meaning this test can be used) for the duration of the COVID-19 declaration under Section 564(b)(1) of the Act, 21 U.S.C. section 360bbb-3(b)(1), unless the authorization is terminated or revoked sooner.  Performed at [facility], [place] [address] [place], [place] [address]      Labs: Basic Metabolic Panel: Recent Labs  Lab 01/31/20 1734 02/01/20 0219 02/04/20 1017 02/05/20 0232  NA 138 137 139 138  K 4.0 4.2 3.5 3.6  CL 103 104 103 102  CO2 23 23 30 29   GLUCOSE 230* 127* 140* 111*  BUN 13 11 8  5*  CREATININE 1.00 0.89 0.96 0.86  CALCIUM 9.0 8.2* 8.5* 8.8*  MG  --   --  1.6* 1.6*  PHOS  --   --  2.5 2.9   Liver Function Tests: Recent Labs  Lab 01/31/20 1734 02/04/20 1017 02/05/20 0232  AST 20 20 20   ALT 18 16 16   ALKPHOS 26* 31* 31*  BILITOT 0.9 0.4 0.5  PROT 6.4* 5.2* 5.5*  ALBUMIN 3.4* 2.8* 2.9*   No results for input(s): LIPASE, AMYLASE in the last 168 hours. No results for input(s): AMMONIA in the last 168 hours. CBC: Recent Labs  Lab 02/01/20 0219 02/01/20 0219 02/01/20 2144 02/03/20 0527 02/03/20 1928 02/04/20 1017 02/05/20 0232  WBC 9.7  --  7.4  --  7.2 5.8 5.3  NEUTROABS 7.9*  --   --   --  5.3  --  3.3  HGB 10.8*   < > 8.9* 5.7* 8.4* 8.1* 8.2*  HCT 32.3*   < > 28.6* 17.9* 24.9* 24.6* 24.9*  MCV 89.7  --  91.7  --  89.9 91.1 91.2  PLT 155  --  186  --  166 194 217   < > = values in this interval not displayed.   Cardiac Enzymes: No results for input(s): CKTOTAL,  CKMB, CKMBINDEX, TROPONINI in the last 168 hours. BNP: BNP (last 3 results) No results for input(s): BNP in the last 8760 hours.  ProBNP (last 3 results) No results for input(s): PROBNP in the last 8760 hours.  CBG: Recent Labs  Lab 02/04/20 1701 02/04/20 2029 02/05/20 0015 02/05/20 0447 02/05/20 0853  GLUCAP 127* 124* 150* 129* 116*       Signed:  Dia Crawford, MD [facility] [phone] pager

## 2020-02-05 NOTE — Progress Notes (Signed)
Discharge instructions provided to patient and wife. All medications, follow up appointments, and discharge instructions provided. IV out. Monitor off CCMD notified.  Discharging to home.  Era Bumpers, RN

## 2020-02-05 NOTE — Progress Notes (Addendum)
Please see yesterday's note.  Vital signs stable.  Hemoglobin stable at 8.2.   Continues to feel well, and ready to go home.  Had another NON-bloody BM this morning.   Recommendation: OK for discharge today. Please call me if any questions.  Pt plans to f/u in PCP's office (rather than our office) for post-hospitalization visit, which I recommend should be about 5-10 days from now, but he knows he can contact our office 24/7 if rebleeding occurs (very unlikely) or if he has any other GI issues.  [name], M.D. Pager [phone] If no answer or after 5 PM call [phone]

## 2020-02-06 LAB — GLUCOSE, CAPILLARY: Glucose-Capillary: 163 mg/dL — ABNORMAL HIGH (ref 70–99)

## 2020-02-13 DIAGNOSIS — D62 Acute posthemorrhagic anemia: Secondary | ICD-10-CM | POA: Diagnosis not present

## 2020-02-13 DIAGNOSIS — D6869 Other thrombophilia: Secondary | ICD-10-CM | POA: Diagnosis not present

## 2020-02-13 DIAGNOSIS — Z8719 Personal history of other diseases of the digestive system: Secondary | ICD-10-CM | POA: Diagnosis not present

## 2020-02-13 DIAGNOSIS — I2510 Atherosclerotic heart disease of native coronary artery without angina pectoris: Secondary | ICD-10-CM | POA: Diagnosis not present

## 2020-02-13 DIAGNOSIS — I129 Hypertensive chronic kidney disease with stage 1 through stage 4 chronic kidney disease, or unspecified chronic kidney disease: Secondary | ICD-10-CM | POA: Diagnosis not present

## 2020-02-13 DIAGNOSIS — J449 Chronic obstructive pulmonary disease, unspecified: Secondary | ICD-10-CM | POA: Diagnosis not present

## 2020-02-13 DIAGNOSIS — E7800 Pure hypercholesterolemia, unspecified: Secondary | ICD-10-CM | POA: Diagnosis not present

## 2020-02-13 DIAGNOSIS — E1169 Type 2 diabetes mellitus with other specified complication: Secondary | ICD-10-CM | POA: Diagnosis not present

## 2020-02-13 DIAGNOSIS — I480 Paroxysmal atrial fibrillation: Secondary | ICD-10-CM | POA: Diagnosis not present

## 2020-02-13 NOTE — Patient Outreach (Signed)
Sent Red Flagged Emmi notification to [email or web address].

## 2020-03-15 DIAGNOSIS — Z23 Encounter for immunization: Secondary | ICD-10-CM | POA: Diagnosis not present

## 2020-03-16 DIAGNOSIS — H10412 Chronic giant papillary conjunctivitis, left eye: Secondary | ICD-10-CM | POA: Diagnosis not present

## 2020-03-21 DIAGNOSIS — K5904 Chronic idiopathic constipation: Secondary | ICD-10-CM | POA: Diagnosis not present

## 2020-03-21 DIAGNOSIS — Z23 Encounter for immunization: Secondary | ICD-10-CM | POA: Diagnosis not present

## 2020-03-21 DIAGNOSIS — Z8719 Personal history of other diseases of the digestive system: Secondary | ICD-10-CM | POA: Diagnosis not present

## 2020-03-21 DIAGNOSIS — K219 Gastro-esophageal reflux disease without esophagitis: Secondary | ICD-10-CM | POA: Diagnosis not present

## 2020-03-21 DIAGNOSIS — Z86010 Personal history of colonic polyps: Secondary | ICD-10-CM | POA: Diagnosis not present

## 2020-05-16 DIAGNOSIS — J449 Chronic obstructive pulmonary disease, unspecified: Secondary | ICD-10-CM | POA: Diagnosis not present

## 2020-05-16 DIAGNOSIS — E1169 Type 2 diabetes mellitus with other specified complication: Secondary | ICD-10-CM | POA: Diagnosis not present

## 2020-05-16 DIAGNOSIS — I700 Atherosclerosis of aorta: Secondary | ICD-10-CM | POA: Diagnosis not present

## 2020-05-16 DIAGNOSIS — I129 Hypertensive chronic kidney disease with stage 1 through stage 4 chronic kidney disease, or unspecified chronic kidney disease: Secondary | ICD-10-CM | POA: Diagnosis not present

## 2020-05-16 DIAGNOSIS — E7800 Pure hypercholesterolemia, unspecified: Secondary | ICD-10-CM | POA: Diagnosis not present

## 2020-08-07 DIAGNOSIS — R198 Other specified symptoms and signs involving the digestive system and abdomen: Secondary | ICD-10-CM | POA: Diagnosis not present

## 2020-08-07 DIAGNOSIS — E1169 Type 2 diabetes mellitus with other specified complication: Secondary | ICD-10-CM | POA: Diagnosis not present

## 2020-10-15 DIAGNOSIS — H5203 Hypermetropia, bilateral: Secondary | ICD-10-CM | POA: Diagnosis not present

## 2020-10-15 DIAGNOSIS — E119 Type 2 diabetes mellitus without complications: Secondary | ICD-10-CM | POA: Diagnosis not present

## 2020-12-10 DIAGNOSIS — Z7984 Long term (current) use of oral hypoglycemic drugs: Secondary | ICD-10-CM | POA: Diagnosis not present

## 2020-12-10 DIAGNOSIS — J449 Chronic obstructive pulmonary disease, unspecified: Secondary | ICD-10-CM | POA: Diagnosis not present

## 2020-12-10 DIAGNOSIS — Z125 Encounter for screening for malignant neoplasm of prostate: Secondary | ICD-10-CM | POA: Diagnosis not present

## 2020-12-10 DIAGNOSIS — D6869 Other thrombophilia: Secondary | ICD-10-CM | POA: Diagnosis not present

## 2020-12-10 DIAGNOSIS — E1169 Type 2 diabetes mellitus with other specified complication: Secondary | ICD-10-CM | POA: Diagnosis not present

## 2020-12-10 DIAGNOSIS — I129 Hypertensive chronic kidney disease with stage 1 through stage 4 chronic kidney disease, or unspecified chronic kidney disease: Secondary | ICD-10-CM | POA: Diagnosis not present

## 2020-12-10 DIAGNOSIS — E7800 Pure hypercholesterolemia, unspecified: Secondary | ICD-10-CM | POA: Diagnosis not present

## 2020-12-10 DIAGNOSIS — I2510 Atherosclerotic heart disease of native coronary artery without angina pectoris: Secondary | ICD-10-CM | POA: Diagnosis not present

## 2020-12-10 DIAGNOSIS — K219 Gastro-esophageal reflux disease without esophagitis: Secondary | ICD-10-CM | POA: Diagnosis not present

## 2020-12-10 DIAGNOSIS — Z0000 Encounter for general adult medical examination without abnormal findings: Secondary | ICD-10-CM | POA: Diagnosis not present

## 2021-05-17 DIAGNOSIS — H6090 Unspecified otitis externa, unspecified ear: Secondary | ICD-10-CM | POA: Diagnosis not present

## 2021-05-17 DIAGNOSIS — I700 Atherosclerosis of aorta: Secondary | ICD-10-CM | POA: Diagnosis not present

## 2021-05-17 DIAGNOSIS — D6869 Other thrombophilia: Secondary | ICD-10-CM | POA: Diagnosis not present

## 2021-05-17 DIAGNOSIS — E1169 Type 2 diabetes mellitus with other specified complication: Secondary | ICD-10-CM | POA: Diagnosis not present

## 2021-05-17 DIAGNOSIS — J449 Chronic obstructive pulmonary disease, unspecified: Secondary | ICD-10-CM | POA: Diagnosis not present

## 2021-05-17 DIAGNOSIS — K219 Gastro-esophageal reflux disease without esophagitis: Secondary | ICD-10-CM | POA: Diagnosis not present

## 2021-05-17 DIAGNOSIS — I2510 Atherosclerotic heart disease of native coronary artery without angina pectoris: Secondary | ICD-10-CM | POA: Diagnosis not present

## 2021-05-17 DIAGNOSIS — I129 Hypertensive chronic kidney disease with stage 1 through stage 4 chronic kidney disease, or unspecified chronic kidney disease: Secondary | ICD-10-CM | POA: Diagnosis not present

## 2021-05-17 DIAGNOSIS — Z23 Encounter for immunization: Secondary | ICD-10-CM | POA: Diagnosis not present

## 2021-05-17 DIAGNOSIS — I480 Paroxysmal atrial fibrillation: Secondary | ICD-10-CM | POA: Diagnosis not present

## 2021-05-17 DIAGNOSIS — K5904 Chronic idiopathic constipation: Secondary | ICD-10-CM | POA: Diagnosis not present

## 2021-06-28 DIAGNOSIS — Z87438 Personal history of other diseases of male genital organs: Secondary | ICD-10-CM | POA: Diagnosis not present

## 2021-06-28 DIAGNOSIS — J449 Chronic obstructive pulmonary disease, unspecified: Secondary | ICD-10-CM | POA: Diagnosis not present

## 2021-06-28 DIAGNOSIS — I129 Hypertensive chronic kidney disease with stage 1 through stage 4 chronic kidney disease, or unspecified chronic kidney disease: Secondary | ICD-10-CM | POA: Diagnosis not present

## 2021-06-28 DIAGNOSIS — I480 Paroxysmal atrial fibrillation: Secondary | ICD-10-CM | POA: Diagnosis not present

## 2021-06-28 DIAGNOSIS — E1169 Type 2 diabetes mellitus with other specified complication: Secondary | ICD-10-CM | POA: Diagnosis not present

## 2021-06-28 DIAGNOSIS — D6869 Other thrombophilia: Secondary | ICD-10-CM | POA: Diagnosis not present

## 2021-06-28 DIAGNOSIS — R1032 Left lower quadrant pain: Secondary | ICD-10-CM | POA: Diagnosis not present

## 2021-06-28 DIAGNOSIS — I700 Atherosclerosis of aorta: Secondary | ICD-10-CM | POA: Diagnosis not present

## 2021-10-09 IMAGING — CT CT ABD-PELV W/ CM
1 series · 15 of 32 positions shown, 19 images · IV contrast (APPLIED)
Comparison: 01/31/2020

CLINICAL DATA: Abdominal distension and rectal bleeding after
colonoscopy today

EXAM:
CT ABDOMEN AND PELVIS WITH CONTRAST
TECHNIQUE: Multidetector CT imaging of the abdomen and pelvis was performed
using the standard protocol following bolus administration of
intravenous contrast.
CONTRAST:  100mL OMNIPAQUE IOHEXOL 300 MG/ML  SOLN

[Series 8: delays 5.0 i30f 2 · axial · 0.93mm/px · z∈[+1162,+1308]mm · 15 of 34 slices shown, 19 images]
[im 3/34  soft-tissue]
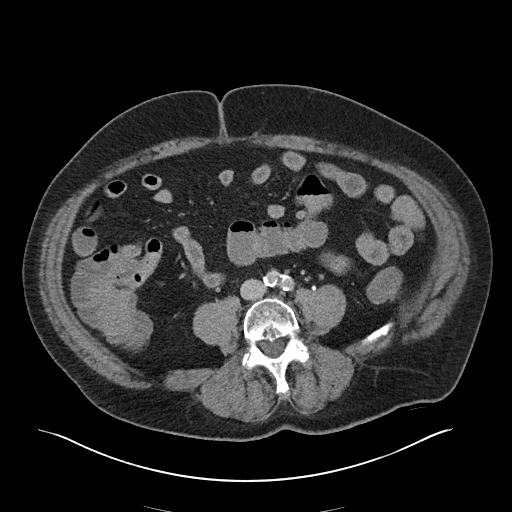
[im 3/34  bone]
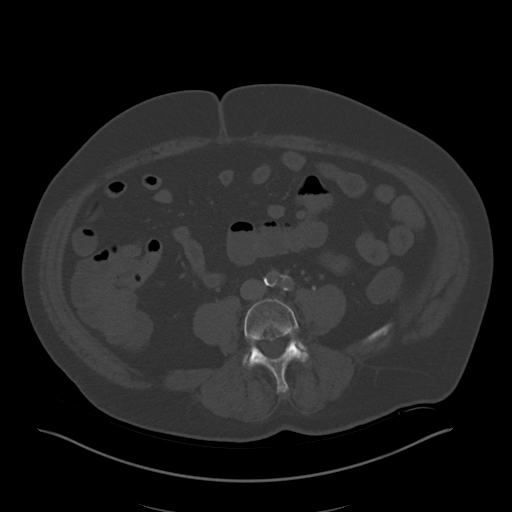
[im 5/34  soft-tissue]
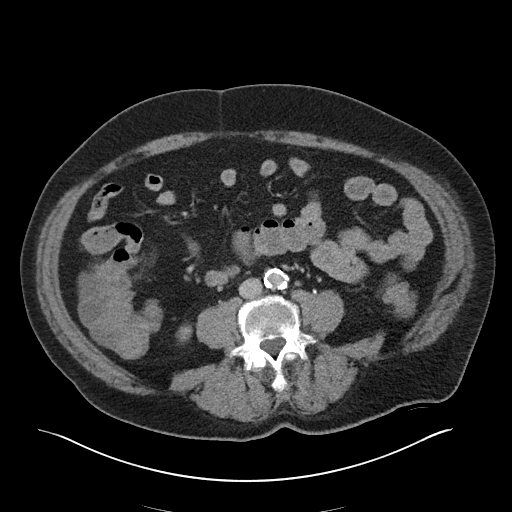
[im 7/34  soft-tissue]
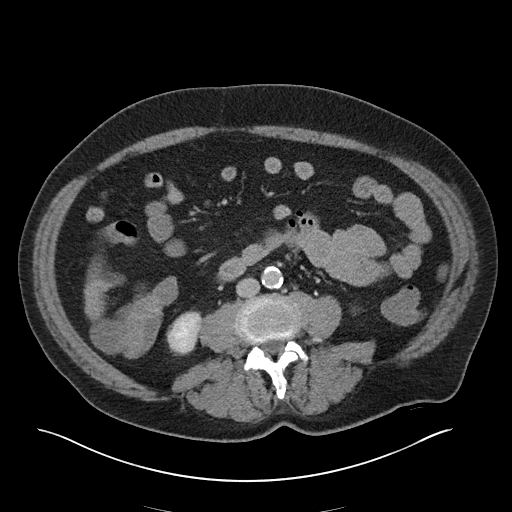
[im 10/34  soft-tissue]
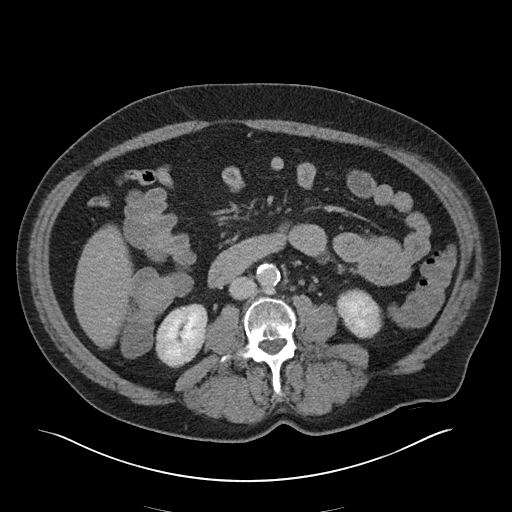
[im 12/34  soft-tissue]
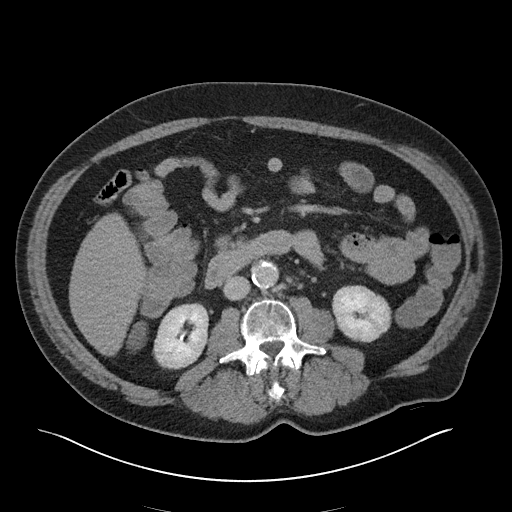
[im 14/34  soft-tissue]
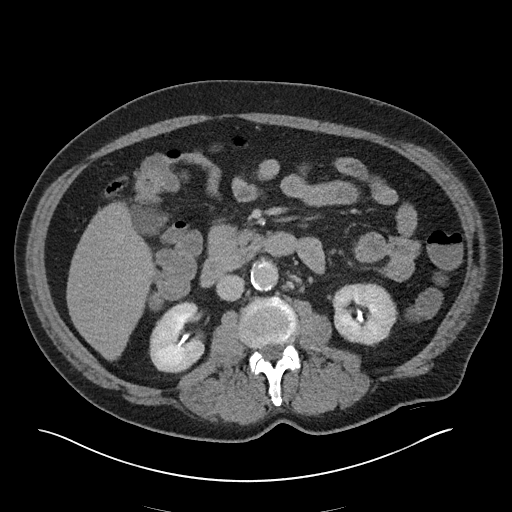
[im 18/34  soft-tissue]
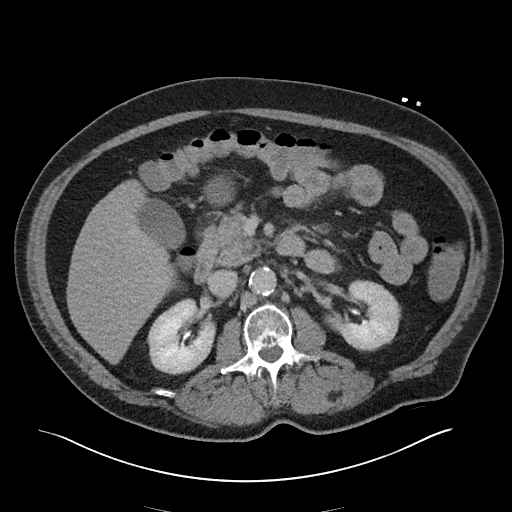
[im 20/34  soft-tissue]
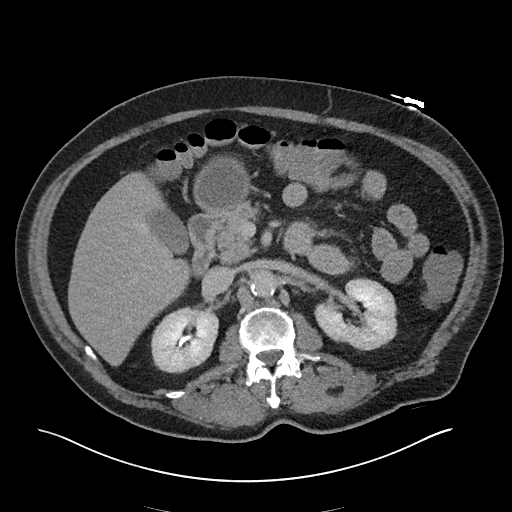
[im 22/34  soft-tissue]
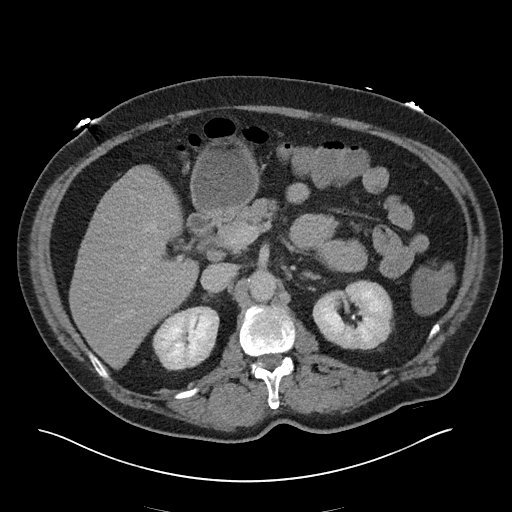
[im 22/34  bone]
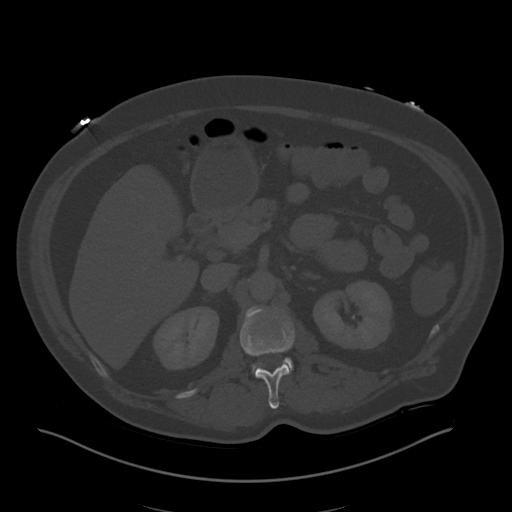
[im 24/34  soft-tissue]
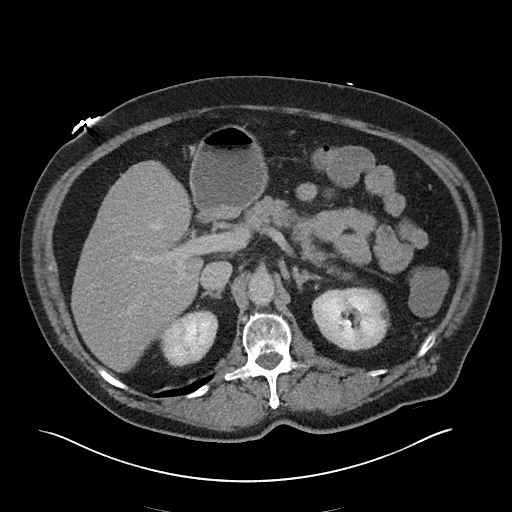
[im 27/34  soft-tissue]
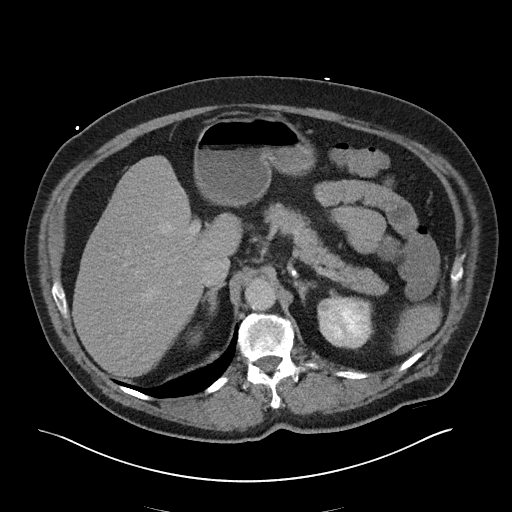
[im 29/34  soft-tissue]
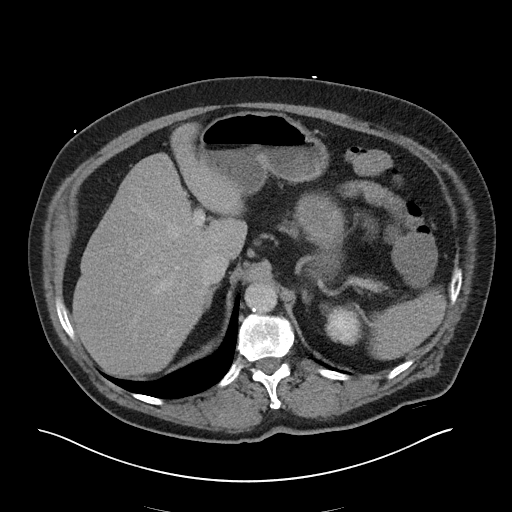
[im 29/34  lung]
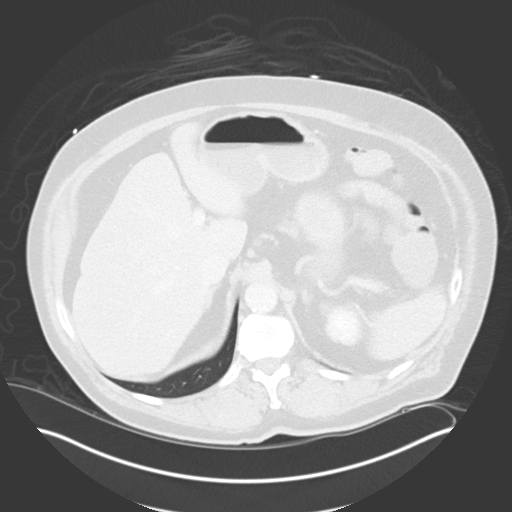
[im 30/34  lung]
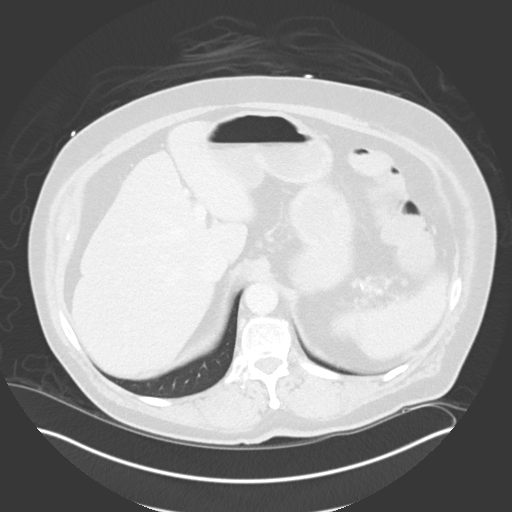
[im 31/34  soft-tissue]
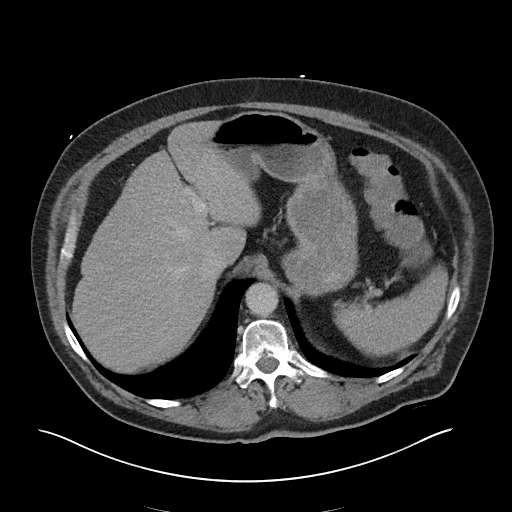
[im 31/34  lung]
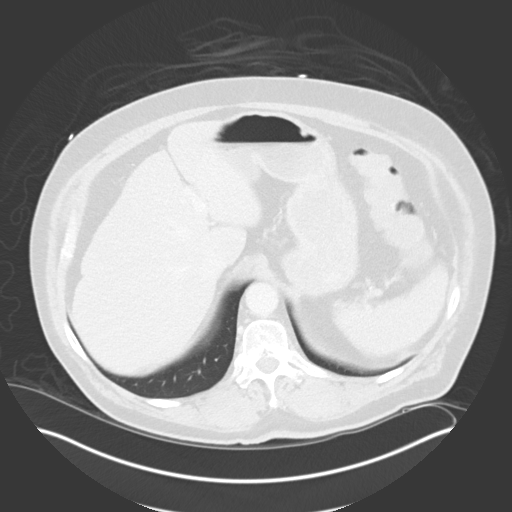
[im 32/34  lung]
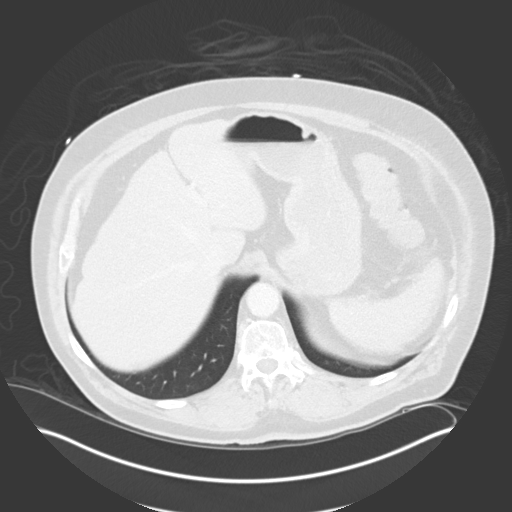

[15 of 32 positions shown; findings below may reference images not displayed]

FINDINGS: Lower chest: No acute pleural or parenchymal lung disease.

Hepatobiliary: No focal liver abnormality is seen. No gallstones,
gallbladder wall thickening, or biliary dilatation.

Pancreas: Unremarkable. No pancreatic ductal dilatation or
surrounding inflammatory changes.

Spleen: Normal in size without focal abnormality.

Adrenals/Urinary Tract: Adrenal glands are unremarkable. Kidneys are
normal, without renal calculi, focal lesion, or hydronephrosis.
Bladder is unremarkable.

Stomach/Bowel: No bowel obstruction or ileus. Normal appendix right
lower quadrant. No bowel wall thickening or inflammatory change.

Vascular/Lymphatic: Aortic atherosclerosis. No enlarged abdominal or
pelvic lymph nodes.

Reproductive: Prostate is unremarkable.

Other: No free fluid or free intra-abdominal gas. No abdominal wall
hernia.

Musculoskeletal: No acute or destructive bony lesion. Reconstructed
images demonstrate no additional findings.
IMPRESSION: 1. No evidence of complication after colonoscopy. No bowel
perforation.
2. No acute intra-abdominal or intrapelvic process.
3.  Aortic Atherosclerosis ([id number]).

## 2021-10-10 IMAGING — XA IR ANGIO/ADD [PERSON_NAME]
11 of 17 series · 11 of 24 positions shown · IV contrast (IODINE)
Comparison: none

INDICATION: Acute lower GI bleeding, positive nuclear medicine blood scan and
positive abdominal pelvis CTA demonstrating cecal bleeding at a
resected polyps site where there is an endo clip.

[Series 2: body 4 care · 1 of 5 slices shown (1 of 11)]
[im 1/5]
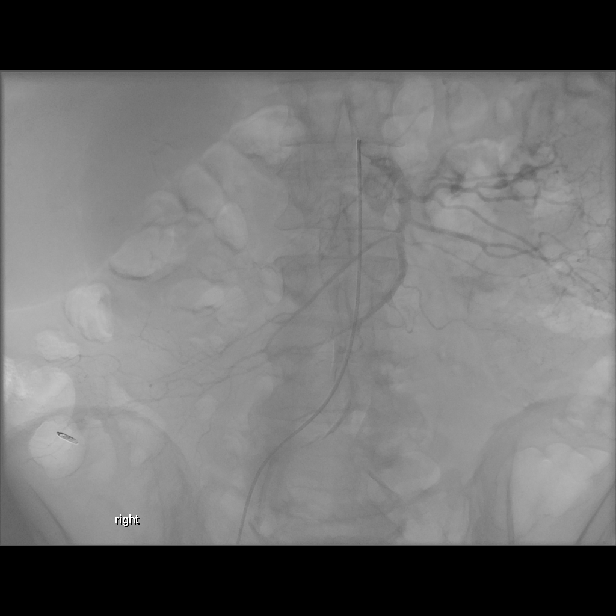

[Series 3: body 4 care · 1 of 22 slices shown (2 of 11)]
[im 22/22]
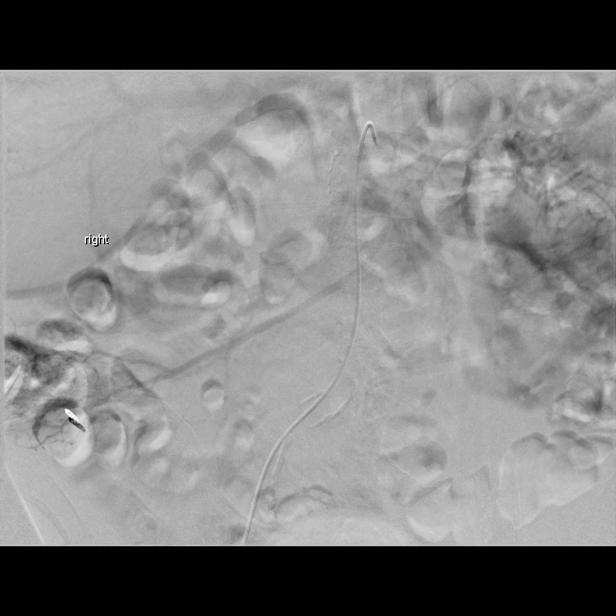

[Series 4: body 4 care · 1 of 14 slices shown (3 of 11)]
[im 14/14]
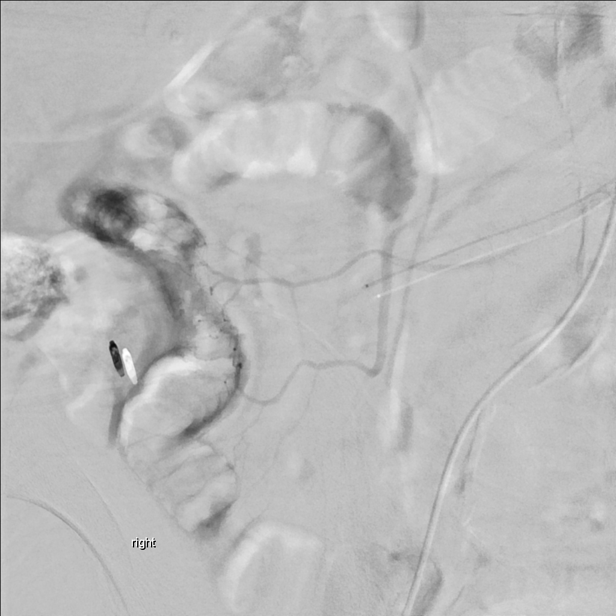

[Series 6: body 4 care · 1 of 6 slices shown (4 of 11)]
[im 1/6]
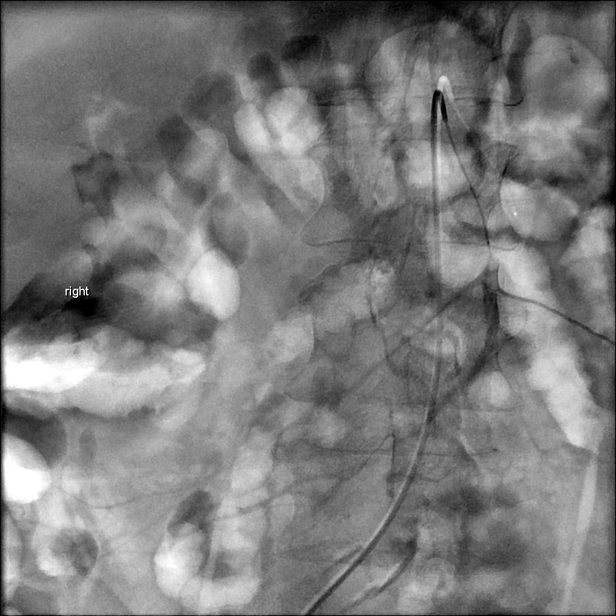

[Series 7: body 4 care · 1 of 15 slices shown (5 of 11)]
[im 15/15]
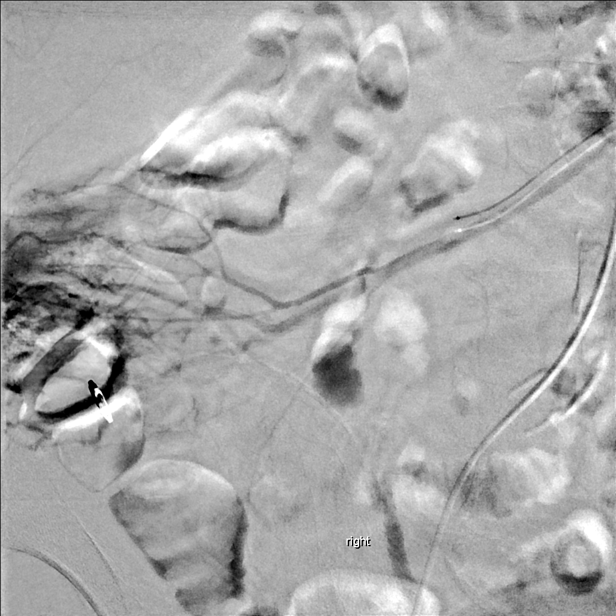

[Series 12: body 4 care · 1 of 12 slices shown (6 of 11)]
[im 1/12]
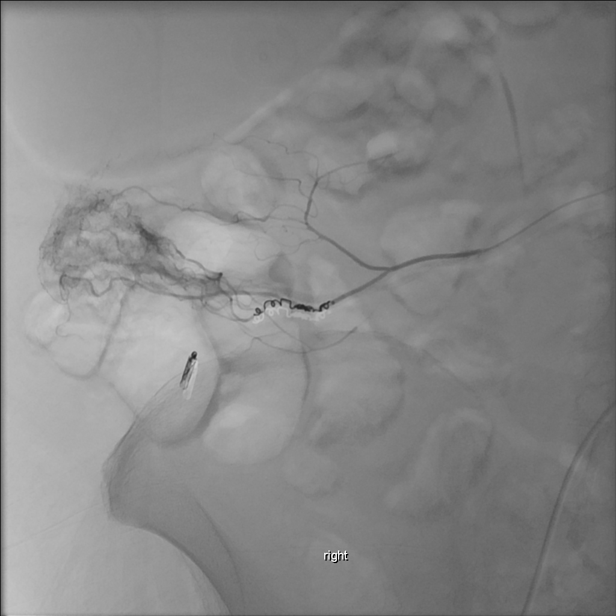

[Series 13: body 4 care · 1 of 22 slices shown (7 of 11)]
[im 22/22]
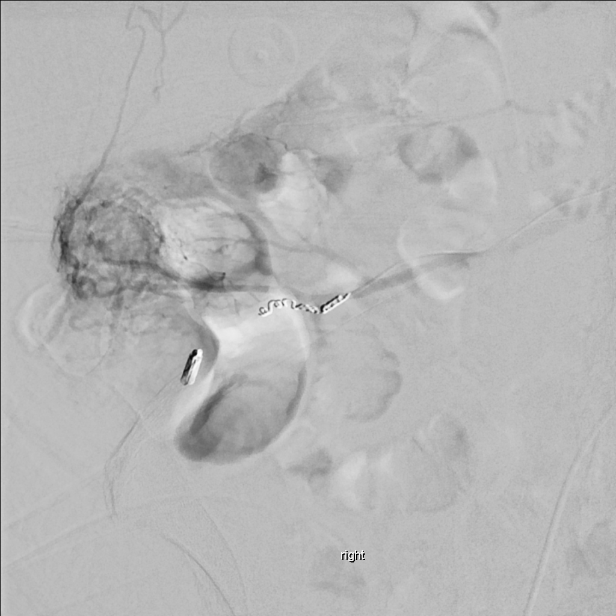

[Series 16: body 4 care · 1 of 12 slices shown (8 of 11)]
[im 1/12]
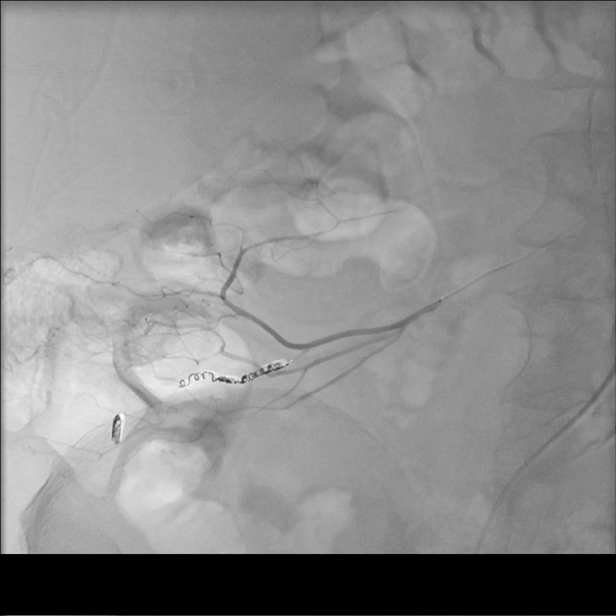

[Series 17: body 4 care · 1 of 14 slices shown (9 of 11)]
[im 14/14]
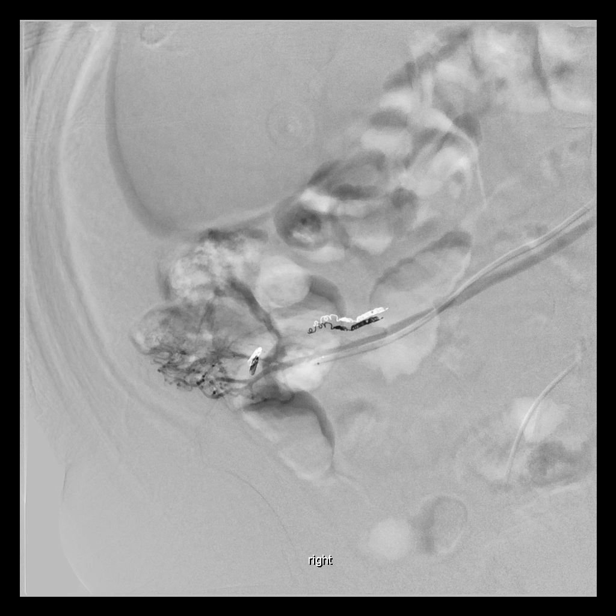

[Series 19: body 4 care · 1 of 21 slices shown (10 of 11)]
[im 21/21]
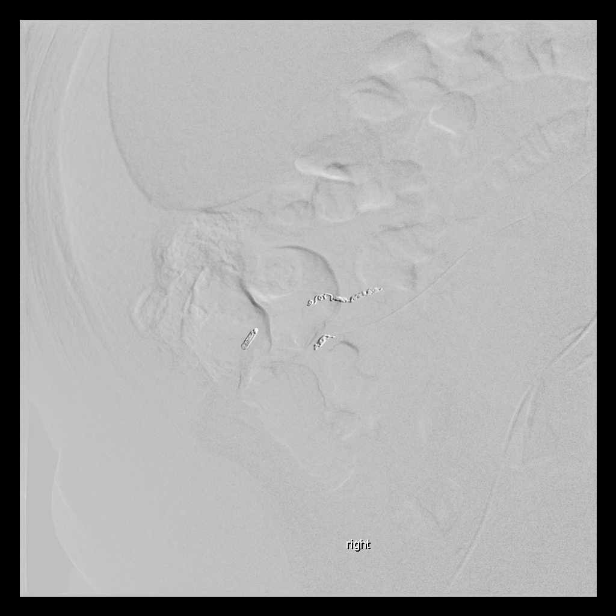

[Series 20: body 4 care · 1 of 17 slices shown (11 of 11)]
[im 17/17]
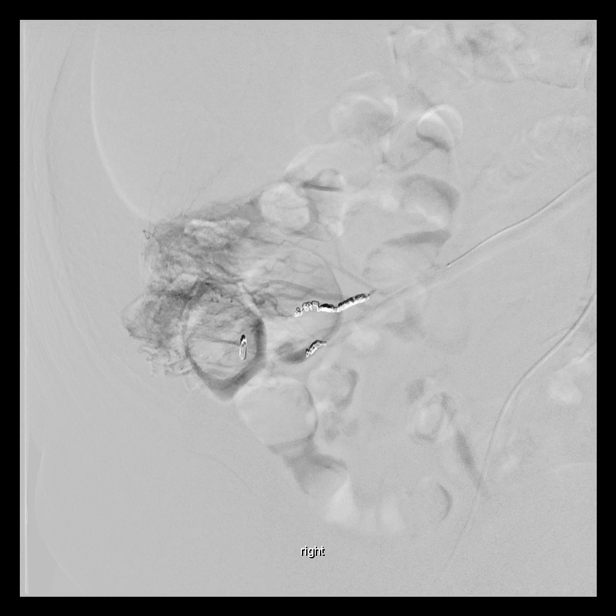

[11 of 24 positions shown; findings below may reference images not displayed]

EXAM:
Ultrasound guidance for vascular access

Celiac and SMA catheterizations and angiograms

Three additional peripheral right ileocolic and right colic micro
catheterization and angiograms

Successful micro coil embolization of 2 peripheral branches of the
right colic vasculature (to the cecal angio dysplasia distribution)

MEDICATIONS:
1% lidocaine local. The antibiotic was administered within 1 hour of
the procedure

ANESTHESIA/SEDATION:
Moderate (conscious) sedation was employed during this procedure. A
total of Versed 1.5 mg and Fentanyl 575 mcg was administered
intravenously.

Moderate Sedation Time: 60 minutes. The patient's level of
consciousness and vital signs were monitored continuously by
radiology nursing throughout the procedure under my direct
supervision.

CONTRAST:  85 cc omni 300

FLUOROSCOPY TIME:  Fluoroscopy Time: 11 minutes 54 seconds (1,835
mGy).

COMPLICATIONS:
None immediate.



Under sterile conditions and local anesthesia, ultrasound
micropuncture access performed of the right common femoral artery.
Images obtained for documentation of the patent right common femoral
artery. Five French sheath inserted a Bentson guidewire.

C2 catheter utilized to initially select the celiac origin. Celiac
angiogram performed.

Celiac: The celiac origin is widely patent including its branches.
Specifically the splenic, left gastric, hepatic and gastroduodenal
vasculature all patent. No active bleeding in the celiac territory.

Catheter was retracted and utilized to select the SMA. Several SMA
angiograms were performed.

Initial SMA: Atherosclerotic changes noted but the SMA is patent.
The jejunal and colic branches are patent throughout the central
mesentery. Limited assessment of the peripheral right lower quadrant
colic branches.

For better visualization, a Renegade STC catheter and a single angle
GT glide wire were utilized to advance the access peripherally
initially into the far distal aspect of the distal SMA. Peripheral
distal SMA angiogram performed.

Peripheral SMA: Peripheral SMA branches are patent supplying small
bowel in the right lower quadrant. Normal vascularity to the small
bowel in this region. No evidence of active contrast extravasation
or bleeding.

Microcatheter was retracted and utilized to select the ileal colic
common trunk. Additional peripheral angiography report.

Ileocolic common trunk: Right ileocolic trunk branches are patent
and supply the cecal area. In this region there is increased
vascularity and tortuosity along the cecum with an early draining
vein compatible with angio dysplasia angiographically.

Renegade STC catheter was advanced over a double angled glidewire
more peripherally into a right colic branch. Peripheral right colic
angiogram performed.

Peripheral right colic branch: Tortuous hypervascularity present in
the cecal area just superior to the endo clip with an early draining
vein compatible with angio dysplasia angiographically.

Peripheral right colic branch micro coil embolization. 2 mm
interlock coils ranging in length from 4-6 cm were successfully
deployed in the peripheral right colic branch. Following
embolization, there is less visualization of the hypervascularity in
the cecal area with preservation of the marginal branch arterial
supply to the colon.

Microcatheter was retracted into the main right colic branch. Repeat
angiogram again confirms additional branches supplying the tortuous
hypervascular cecal area with the early draining vein remaining.
Again, findings are concerning for cecal angiodysplasia.

Microcatheter was advanced into a inferior right colic peripheral
branch directed toward the endo clip site. Second peripheral right
colic branch angiogram performed.

Second inferior peripheral right colic branch: This right colic
branch also demonstrates arterial supply to the tortuous
hypervascular area of the cecum with an early draining vein again
compatible with angiodysplasia.

Second inferior peripheral right colic branch micro coil
embolization: Additional 2 mm x 6 cm micro coil was deployed within
this second inferior right colic branch.

Microcatheter again was retracted more proximal into the main right
colic branch. Repeat right colic angiogram performed.

Right colic angiogram: Following embolization of the 2 peripheral
right colic branches, there is less arterial supply to the
hypervascular cecal abnormality with the early draining vein less
apparent. There is preserved marginal branch arterial supply to the
cecum.

At this point, the access was removed.

Hemostasis obtained at the right common femoral artery access site
with the ExoSeal device. No immediate complication. Patient
tolerated the procedure well.
IMPRESSION: Positive exam for angiographic cecal angio dysplasia with successful
micro coil embolization of 2 peripheral right colic branches to the
angio dysplasia vasculature. Following embolization there is less
arterial vascular supply to the cecal angio dysplasia with preserved
marginal branch supply to the colon.

## 2021-12-12 DIAGNOSIS — K219 Gastro-esophageal reflux disease without esophagitis: Secondary | ICD-10-CM | POA: Diagnosis not present

## 2021-12-12 DIAGNOSIS — Z0000 Encounter for general adult medical examination without abnormal findings: Secondary | ICD-10-CM | POA: Diagnosis not present

## 2021-12-12 DIAGNOSIS — Z125 Encounter for screening for malignant neoplasm of prostate: Secondary | ICD-10-CM | POA: Diagnosis not present

## 2021-12-12 DIAGNOSIS — J449 Chronic obstructive pulmonary disease, unspecified: Secondary | ICD-10-CM | POA: Diagnosis not present

## 2021-12-12 DIAGNOSIS — E1169 Type 2 diabetes mellitus with other specified complication: Secondary | ICD-10-CM | POA: Diagnosis not present

## 2021-12-12 DIAGNOSIS — I2510 Atherosclerotic heart disease of native coronary artery without angina pectoris: Secondary | ICD-10-CM | POA: Diagnosis not present

## 2021-12-12 DIAGNOSIS — E7800 Pure hypercholesterolemia, unspecified: Secondary | ICD-10-CM | POA: Diagnosis not present

## 2021-12-12 DIAGNOSIS — I129 Hypertensive chronic kidney disease with stage 1 through stage 4 chronic kidney disease, or unspecified chronic kidney disease: Secondary | ICD-10-CM | POA: Diagnosis not present

## 2021-12-12 DIAGNOSIS — M1990 Unspecified osteoarthritis, unspecified site: Secondary | ICD-10-CM | POA: Diagnosis not present

## 2021-12-12 DIAGNOSIS — K5901 Slow transit constipation: Secondary | ICD-10-CM | POA: Diagnosis not present

## 2021-12-12 DIAGNOSIS — D6869 Other thrombophilia: Secondary | ICD-10-CM | POA: Diagnosis not present

## 2021-12-12 DIAGNOSIS — I480 Paroxysmal atrial fibrillation: Secondary | ICD-10-CM | POA: Diagnosis not present

## 2022-03-11 DIAGNOSIS — Z23 Encounter for immunization: Secondary | ICD-10-CM | POA: Diagnosis not present

## 2022-05-06 DIAGNOSIS — I129 Hypertensive chronic kidney disease with stage 1 through stage 4 chronic kidney disease, or unspecified chronic kidney disease: Secondary | ICD-10-CM | POA: Diagnosis not present

## 2022-05-06 DIAGNOSIS — E1169 Type 2 diabetes mellitus with other specified complication: Secondary | ICD-10-CM | POA: Diagnosis not present

## 2022-05-06 DIAGNOSIS — M1990 Unspecified osteoarthritis, unspecified site: Secondary | ICD-10-CM | POA: Diagnosis not present

## 2022-05-06 DIAGNOSIS — J449 Chronic obstructive pulmonary disease, unspecified: Secondary | ICD-10-CM | POA: Diagnosis not present

## 2022-05-06 DIAGNOSIS — K219 Gastro-esophageal reflux disease without esophagitis: Secondary | ICD-10-CM | POA: Diagnosis not present

## 2022-05-06 DIAGNOSIS — E7800 Pure hypercholesterolemia, unspecified: Secondary | ICD-10-CM | POA: Diagnosis not present

## 2022-05-06 DIAGNOSIS — I480 Paroxysmal atrial fibrillation: Secondary | ICD-10-CM | POA: Diagnosis not present

## 2022-05-06 DIAGNOSIS — I2510 Atherosclerotic heart disease of native coronary artery without angina pectoris: Secondary | ICD-10-CM | POA: Diagnosis not present

## 2022-05-16 DIAGNOSIS — L02414 Cutaneous abscess of left upper limb: Secondary | ICD-10-CM | POA: Diagnosis not present

## 2022-05-16 DIAGNOSIS — L0292 Furuncle, unspecified: Secondary | ICD-10-CM | POA: Diagnosis not present

## 2022-05-16 DIAGNOSIS — E1169 Type 2 diabetes mellitus with other specified complication: Secondary | ICD-10-CM | POA: Diagnosis not present

## 2022-05-27 DIAGNOSIS — I2510 Atherosclerotic heart disease of native coronary artery without angina pectoris: Secondary | ICD-10-CM | POA: Diagnosis not present

## 2022-05-27 DIAGNOSIS — I700 Atherosclerosis of aorta: Secondary | ICD-10-CM | POA: Diagnosis not present

## 2022-05-27 DIAGNOSIS — J449 Chronic obstructive pulmonary disease, unspecified: Secondary | ICD-10-CM | POA: Diagnosis not present

## 2022-05-27 DIAGNOSIS — K219 Gastro-esophageal reflux disease without esophagitis: Secondary | ICD-10-CM | POA: Diagnosis not present

## 2022-05-27 DIAGNOSIS — I129 Hypertensive chronic kidney disease with stage 1 through stage 4 chronic kidney disease, or unspecified chronic kidney disease: Secondary | ICD-10-CM | POA: Diagnosis not present

## 2022-05-27 DIAGNOSIS — E1169 Type 2 diabetes mellitus with other specified complication: Secondary | ICD-10-CM | POA: Diagnosis not present

## 2022-05-27 DIAGNOSIS — E7800 Pure hypercholesterolemia, unspecified: Secondary | ICD-10-CM | POA: Diagnosis not present

## 2022-05-27 DIAGNOSIS — M1990 Unspecified osteoarthritis, unspecified site: Secondary | ICD-10-CM | POA: Diagnosis not present

## 2022-06-11 DIAGNOSIS — I129 Hypertensive chronic kidney disease with stage 1 through stage 4 chronic kidney disease, or unspecified chronic kidney disease: Secondary | ICD-10-CM | POA: Diagnosis not present

## 2022-06-11 DIAGNOSIS — I480 Paroxysmal atrial fibrillation: Secondary | ICD-10-CM | POA: Diagnosis not present

## 2022-06-11 DIAGNOSIS — M1990 Unspecified osteoarthritis, unspecified site: Secondary | ICD-10-CM | POA: Diagnosis not present

## 2022-06-11 DIAGNOSIS — I2510 Atherosclerotic heart disease of native coronary artery without angina pectoris: Secondary | ICD-10-CM | POA: Diagnosis not present

## 2022-06-11 DIAGNOSIS — E7800 Pure hypercholesterolemia, unspecified: Secondary | ICD-10-CM | POA: Diagnosis not present

## 2022-06-11 DIAGNOSIS — J449 Chronic obstructive pulmonary disease, unspecified: Secondary | ICD-10-CM | POA: Diagnosis not present

## 2022-06-11 DIAGNOSIS — K219 Gastro-esophageal reflux disease without esophagitis: Secondary | ICD-10-CM | POA: Diagnosis not present

## 2022-06-11 DIAGNOSIS — E1169 Type 2 diabetes mellitus with other specified complication: Secondary | ICD-10-CM | POA: Diagnosis not present

## 2022-07-01 DIAGNOSIS — C44729 Squamous cell carcinoma of skin of left lower limb, including hip: Secondary | ICD-10-CM | POA: Diagnosis not present

## 2022-07-24 DIAGNOSIS — Z08 Encounter for follow-up examination after completed treatment for malignant neoplasm: Secondary | ICD-10-CM | POA: Diagnosis not present

## 2022-07-24 DIAGNOSIS — L928 Other granulomatous disorders of the skin and subcutaneous tissue: Secondary | ICD-10-CM | POA: Diagnosis not present

## 2022-07-24 DIAGNOSIS — Z85828 Personal history of other malignant neoplasm of skin: Secondary | ICD-10-CM | POA: Diagnosis not present

## 2022-09-16 DIAGNOSIS — S50861A Insect bite (nonvenomous) of right forearm, initial encounter: Secondary | ICD-10-CM | POA: Diagnosis not present

## 2022-09-16 DIAGNOSIS — L720 Epidermal cyst: Secondary | ICD-10-CM | POA: Diagnosis not present

## 2022-09-22 DIAGNOSIS — E1169 Type 2 diabetes mellitus with other specified complication: Secondary | ICD-10-CM | POA: Diagnosis not present

## 2022-09-22 DIAGNOSIS — E7800 Pure hypercholesterolemia, unspecified: Secondary | ICD-10-CM | POA: Diagnosis not present

## 2022-09-22 DIAGNOSIS — J449 Chronic obstructive pulmonary disease, unspecified: Secondary | ICD-10-CM | POA: Diagnosis not present

## 2022-09-22 DIAGNOSIS — K219 Gastro-esophageal reflux disease without esophagitis: Secondary | ICD-10-CM | POA: Diagnosis not present

## 2022-09-22 DIAGNOSIS — I2510 Atherosclerotic heart disease of native coronary artery without angina pectoris: Secondary | ICD-10-CM | POA: Diagnosis not present

## 2022-09-22 DIAGNOSIS — I129 Hypertensive chronic kidney disease with stage 1 through stage 4 chronic kidney disease, or unspecified chronic kidney disease: Secondary | ICD-10-CM | POA: Diagnosis not present

## 2022-09-22 DIAGNOSIS — M1990 Unspecified osteoarthritis, unspecified site: Secondary | ICD-10-CM | POA: Diagnosis not present

## 2022-09-22 DIAGNOSIS — I480 Paroxysmal atrial fibrillation: Secondary | ICD-10-CM | POA: Diagnosis not present

## 2022-10-29 DIAGNOSIS — D23111 Other benign neoplasm of skin of right upper eyelid, including canthus: Secondary | ICD-10-CM | POA: Diagnosis not present

## 2022-10-29 DIAGNOSIS — H02834 Dermatochalasis of left upper eyelid: Secondary | ICD-10-CM | POA: Diagnosis not present

## 2022-10-29 DIAGNOSIS — E119 Type 2 diabetes mellitus without complications: Secondary | ICD-10-CM | POA: Diagnosis not present

## 2022-10-29 DIAGNOSIS — H5203 Hypermetropia, bilateral: Secondary | ICD-10-CM | POA: Diagnosis not present

## 2022-10-29 DIAGNOSIS — H02831 Dermatochalasis of right upper eyelid: Secondary | ICD-10-CM | POA: Diagnosis not present

## 2022-10-29 DIAGNOSIS — H2513 Age-related nuclear cataract, bilateral: Secondary | ICD-10-CM | POA: Diagnosis not present

## 2022-11-11 DIAGNOSIS — E1169 Type 2 diabetes mellitus with other specified complication: Secondary | ICD-10-CM | POA: Diagnosis not present

## 2022-11-11 DIAGNOSIS — J449 Chronic obstructive pulmonary disease, unspecified: Secondary | ICD-10-CM | POA: Diagnosis not present

## 2022-11-11 DIAGNOSIS — I129 Hypertensive chronic kidney disease with stage 1 through stage 4 chronic kidney disease, or unspecified chronic kidney disease: Secondary | ICD-10-CM | POA: Diagnosis not present

## 2022-11-11 DIAGNOSIS — I2510 Atherosclerotic heart disease of native coronary artery without angina pectoris: Secondary | ICD-10-CM | POA: Diagnosis not present

## 2022-11-11 DIAGNOSIS — E7800 Pure hypercholesterolemia, unspecified: Secondary | ICD-10-CM | POA: Diagnosis not present

## 2022-11-11 DIAGNOSIS — K219 Gastro-esophageal reflux disease without esophagitis: Secondary | ICD-10-CM | POA: Diagnosis not present

## 2022-11-11 DIAGNOSIS — I480 Paroxysmal atrial fibrillation: Secondary | ICD-10-CM | POA: Diagnosis not present

## 2022-11-11 DIAGNOSIS — M1990 Unspecified osteoarthritis, unspecified site: Secondary | ICD-10-CM | POA: Diagnosis not present

## 2022-11-13 DIAGNOSIS — H0100A Unspecified blepharitis right eye, upper and lower eyelids: Secondary | ICD-10-CM | POA: Diagnosis not present

## 2022-11-13 DIAGNOSIS — H04123 Dry eye syndrome of bilateral lacrimal glands: Secondary | ICD-10-CM | POA: Diagnosis not present

## 2022-11-13 DIAGNOSIS — H0100B Unspecified blepharitis left eye, upper and lower eyelids: Secondary | ICD-10-CM | POA: Diagnosis not present

## 2022-12-15 DIAGNOSIS — K219 Gastro-esophageal reflux disease without esophagitis: Secondary | ICD-10-CM | POA: Diagnosis not present

## 2022-12-15 DIAGNOSIS — D6869 Other thrombophilia: Secondary | ICD-10-CM | POA: Diagnosis not present

## 2022-12-15 DIAGNOSIS — I480 Paroxysmal atrial fibrillation: Secondary | ICD-10-CM | POA: Diagnosis not present

## 2022-12-15 DIAGNOSIS — E1169 Type 2 diabetes mellitus with other specified complication: Secondary | ICD-10-CM | POA: Diagnosis not present

## 2022-12-15 DIAGNOSIS — I2510 Atherosclerotic heart disease of native coronary artery without angina pectoris: Secondary | ICD-10-CM | POA: Diagnosis not present

## 2022-12-15 DIAGNOSIS — Z0000 Encounter for general adult medical examination without abnormal findings: Secondary | ICD-10-CM | POA: Diagnosis not present

## 2022-12-15 DIAGNOSIS — I700 Atherosclerosis of aorta: Secondary | ICD-10-CM | POA: Diagnosis not present

## 2022-12-15 DIAGNOSIS — H6090 Unspecified otitis externa, unspecified ear: Secondary | ICD-10-CM | POA: Diagnosis not present

## 2022-12-15 DIAGNOSIS — E7800 Pure hypercholesterolemia, unspecified: Secondary | ICD-10-CM | POA: Diagnosis not present

## 2022-12-15 DIAGNOSIS — Z125 Encounter for screening for malignant neoplasm of prostate: Secondary | ICD-10-CM | POA: Diagnosis not present

## 2022-12-15 DIAGNOSIS — J449 Chronic obstructive pulmonary disease, unspecified: Secondary | ICD-10-CM | POA: Diagnosis not present

## 2022-12-15 DIAGNOSIS — I129 Hypertensive chronic kidney disease with stage 1 through stage 4 chronic kidney disease, or unspecified chronic kidney disease: Secondary | ICD-10-CM | POA: Diagnosis not present

## 2023-03-26 DIAGNOSIS — R1032 Left lower quadrant pain: Secondary | ICD-10-CM | POA: Diagnosis not present

## 2023-03-26 DIAGNOSIS — R300 Dysuria: Secondary | ICD-10-CM | POA: Diagnosis not present

## 2023-03-26 DIAGNOSIS — L853 Xerosis cutis: Secondary | ICD-10-CM | POA: Diagnosis not present

## 2023-04-13 ENCOUNTER — Encounter: Payer: Self-pay | Admitting: Genetic Counselor

## 2023-04-13 NOTE — Progress Notes (Signed)
UPDATE ATM [id number].[id number] VUS has been reclassified to Benign.  The amended report date is December 22, 2022.

## 2023-05-29 DIAGNOSIS — J449 Chronic obstructive pulmonary disease, unspecified: Secondary | ICD-10-CM | POA: Diagnosis not present

## 2023-05-29 DIAGNOSIS — E1169 Type 2 diabetes mellitus with other specified complication: Secondary | ICD-10-CM | POA: Diagnosis not present

## 2023-05-29 DIAGNOSIS — Z23 Encounter for immunization: Secondary | ICD-10-CM | POA: Diagnosis not present

## 2023-05-29 DIAGNOSIS — E7800 Pure hypercholesterolemia, unspecified: Secondary | ICD-10-CM | POA: Diagnosis not present

## 2023-05-29 DIAGNOSIS — I700 Atherosclerosis of aorta: Secondary | ICD-10-CM | POA: Diagnosis not present

## 2023-05-29 DIAGNOSIS — D6869 Other thrombophilia: Secondary | ICD-10-CM | POA: Diagnosis not present

## 2023-05-29 DIAGNOSIS — I480 Paroxysmal atrial fibrillation: Secondary | ICD-10-CM | POA: Diagnosis not present

## 2023-05-29 DIAGNOSIS — N1831 Chronic kidney disease, stage 3a: Secondary | ICD-10-CM | POA: Diagnosis not present

## 2023-05-29 DIAGNOSIS — I129 Hypertensive chronic kidney disease with stage 1 through stage 4 chronic kidney disease, or unspecified chronic kidney disease: Secondary | ICD-10-CM | POA: Diagnosis not present

## 2023-05-29 DIAGNOSIS — I2510 Atherosclerotic heart disease of native coronary artery without angina pectoris: Secondary | ICD-10-CM | POA: Diagnosis not present

## 2023-05-29 DIAGNOSIS — K219 Gastro-esophageal reflux disease without esophagitis: Secondary | ICD-10-CM | POA: Diagnosis not present

## 2023-07-13 DIAGNOSIS — L821 Other seborrheic keratosis: Secondary | ICD-10-CM | POA: Diagnosis not present

## 2023-07-13 DIAGNOSIS — C44529 Squamous cell carcinoma of skin of other part of trunk: Secondary | ICD-10-CM | POA: Diagnosis not present

## 2023-07-13 DIAGNOSIS — C44612 Basal cell carcinoma of skin of right upper limb, including shoulder: Secondary | ICD-10-CM | POA: Diagnosis not present

## 2023-07-13 DIAGNOSIS — L820 Inflamed seborrheic keratosis: Secondary | ICD-10-CM | POA: Diagnosis not present

## 2023-07-13 DIAGNOSIS — D225 Melanocytic nevi of trunk: Secondary | ICD-10-CM | POA: Diagnosis not present

## 2023-09-03 DIAGNOSIS — H6092 Unspecified otitis externa, left ear: Secondary | ICD-10-CM | POA: Diagnosis not present

## 2023-09-03 DIAGNOSIS — E1169 Type 2 diabetes mellitus with other specified complication: Secondary | ICD-10-CM | POA: Diagnosis not present

## 2023-09-22 DIAGNOSIS — E1169 Type 2 diabetes mellitus with other specified complication: Secondary | ICD-10-CM | POA: Diagnosis not present

## 2023-09-29 DIAGNOSIS — H9202 Otalgia, left ear: Secondary | ICD-10-CM | POA: Diagnosis not present

## 2023-09-29 DIAGNOSIS — E1169 Type 2 diabetes mellitus with other specified complication: Secondary | ICD-10-CM | POA: Diagnosis not present

## 2023-09-29 DIAGNOSIS — H6090 Unspecified otitis externa, unspecified ear: Secondary | ICD-10-CM | POA: Diagnosis not present

## 2023-09-29 DIAGNOSIS — H9212 Otorrhea, left ear: Secondary | ICD-10-CM | POA: Diagnosis not present

## 2023-09-29 DIAGNOSIS — E7800 Pure hypercholesterolemia, unspecified: Secondary | ICD-10-CM | POA: Diagnosis not present

## 2023-09-29 DIAGNOSIS — Z87438 Personal history of other diseases of male genital organs: Secondary | ICD-10-CM | POA: Diagnosis not present

## 2023-09-29 DIAGNOSIS — I129 Hypertensive chronic kidney disease with stage 1 through stage 4 chronic kidney disease, or unspecified chronic kidney disease: Secondary | ICD-10-CM | POA: Diagnosis not present

## 2023-12-16 DIAGNOSIS — I2510 Atherosclerotic heart disease of native coronary artery without angina pectoris: Secondary | ICD-10-CM | POA: Diagnosis not present

## 2023-12-16 DIAGNOSIS — Z1331 Encounter for screening for depression: Secondary | ICD-10-CM | POA: Diagnosis not present

## 2023-12-16 DIAGNOSIS — Z0000 Encounter for general adult medical examination without abnormal findings: Secondary | ICD-10-CM | POA: Diagnosis not present

## 2023-12-16 DIAGNOSIS — M1990 Unspecified osteoarthritis, unspecified site: Secondary | ICD-10-CM | POA: Diagnosis not present

## 2023-12-16 DIAGNOSIS — E1169 Type 2 diabetes mellitus with other specified complication: Secondary | ICD-10-CM | POA: Diagnosis not present

## 2023-12-16 DIAGNOSIS — N1831 Chronic kidney disease, stage 3a: Secondary | ICD-10-CM | POA: Diagnosis not present

## 2023-12-16 DIAGNOSIS — I480 Paroxysmal atrial fibrillation: Secondary | ICD-10-CM | POA: Diagnosis not present

## 2023-12-16 DIAGNOSIS — I129 Hypertensive chronic kidney disease with stage 1 through stage 4 chronic kidney disease, or unspecified chronic kidney disease: Secondary | ICD-10-CM | POA: Diagnosis not present

## 2023-12-16 DIAGNOSIS — E7800 Pure hypercholesterolemia, unspecified: Secondary | ICD-10-CM | POA: Diagnosis not present

## 2023-12-16 DIAGNOSIS — J449 Chronic obstructive pulmonary disease, unspecified: Secondary | ICD-10-CM | POA: Diagnosis not present

## 2023-12-16 DIAGNOSIS — D6869 Other thrombophilia: Secondary | ICD-10-CM | POA: Diagnosis not present

## 2023-12-30 ENCOUNTER — Telehealth: Payer: Self-pay | Admitting: Pharmacist

## 2023-12-30 NOTE — Progress Notes (Signed)
   12/30/2023  [name], male   DOB: [birth year], 73 y.o.   MRN: [id number]  Called and spoke with patient's spouse on the phone today. Just now leaving the office after picking up the Kerendia. Discussed he is to take 1 tablet daily. I will call in 1 week to see how he is doing. If doing well, will send script to [facility] for processing. Patient will call the number on the paper provided to set-up the self-pay option. When getting this script, he will THEN be doing a half-tablet daily to save money.   Confirmed understanding. Follow-up in 1 week. Potassium check on 8/8.    Aloysius Lewis, PharmD [facility]  Phone Number: [phone]

## 2024-01-06 NOTE — Progress Notes (Signed)
 Called and spoke with the patient's spouse on the phone today. Reports he has been doing well at this time with no new symptoms.  Advised I will send the Leonore Rx over to [facility] now. She is welcome to give them the insurance information to see if the price comes down and if not, she can opt for the $99 monthly instead. Confirmed understanding and aware to call if any issues with getting this set-up.   Follow-up for potassium labs testing in August.

## 2024-01-22 DIAGNOSIS — Z79899 Other long term (current) drug therapy: Secondary | ICD-10-CM | POA: Diagnosis not present

## 2024-02-18 DIAGNOSIS — D0461 Carcinoma in situ of skin of right upper limb, including shoulder: Secondary | ICD-10-CM | POA: Diagnosis not present

## 2024-02-18 DIAGNOSIS — L820 Inflamed seborrheic keratosis: Secondary | ICD-10-CM | POA: Diagnosis not present

## 2024-02-18 DIAGNOSIS — X32XXXD Exposure to sunlight, subsequent encounter: Secondary | ICD-10-CM | POA: Diagnosis not present

## 2024-02-18 DIAGNOSIS — L905 Scar conditions and fibrosis of skin: Secondary | ICD-10-CM | POA: Diagnosis not present

## 2024-02-18 DIAGNOSIS — L918 Other hypertrophic disorders of the skin: Secondary | ICD-10-CM | POA: Diagnosis not present

## 2024-02-18 DIAGNOSIS — L570 Actinic keratosis: Secondary | ICD-10-CM | POA: Diagnosis not present

## 2024-02-29 DIAGNOSIS — H5203 Hypermetropia, bilateral: Secondary | ICD-10-CM | POA: Diagnosis not present

## 2024-02-29 DIAGNOSIS — E119 Type 2 diabetes mellitus without complications: Secondary | ICD-10-CM | POA: Diagnosis not present

## 2024-02-29 DIAGNOSIS — H25813 Combined forms of age-related cataract, bilateral: Secondary | ICD-10-CM | POA: Diagnosis not present

## 2024-03-24 DIAGNOSIS — E1169 Type 2 diabetes mellitus with other specified complication: Secondary | ICD-10-CM | POA: Diagnosis not present
